# Patient Record
Sex: Male | Born: 1940
Health system: Southern US, Community
[De-identification: ages and names within clinical notes are randomized; demographics above are authoritative.]

## PROBLEM LIST (undated history)

## (undated) DIAGNOSIS — I10 Essential (primary) hypertension: Secondary | ICD-10-CM

## (undated) DIAGNOSIS — Z8546 Personal history of malignant neoplasm of prostate: Secondary | ICD-10-CM

## (undated) DIAGNOSIS — T7840XA Allergy, unspecified, initial encounter: Secondary | ICD-10-CM

## (undated) DIAGNOSIS — E876 Hypokalemia: Secondary | ICD-10-CM

## (undated) DIAGNOSIS — C801 Malignant (primary) neoplasm, unspecified: Secondary | ICD-10-CM

## (undated) DIAGNOSIS — H269 Unspecified cataract: Secondary | ICD-10-CM

## (undated) DIAGNOSIS — G4733 Obstructive sleep apnea (adult) (pediatric): Secondary | ICD-10-CM

## (undated) DIAGNOSIS — G4730 Sleep apnea, unspecified: Secondary | ICD-10-CM

## (undated) DIAGNOSIS — I639 Cerebral infarction, unspecified: Secondary | ICD-10-CM

## (undated) DIAGNOSIS — N059 Unspecified nephritic syndrome with unspecified morphologic changes: Secondary | ICD-10-CM

## (undated) DIAGNOSIS — I2510 Atherosclerotic heart disease of native coronary artery without angina pectoris: Secondary | ICD-10-CM

## (undated) DIAGNOSIS — E785 Hyperlipidemia, unspecified: Secondary | ICD-10-CM

## (undated) HISTORY — DX: Hypokalemia: E87.6

## (undated) HISTORY — DX: Sleep apnea, unspecified: G47.30

## (undated) HISTORY — DX: Obstructive sleep apnea (adult) (pediatric): G47.33

## (undated) HISTORY — PX: CAROTID STENT: SHX1301

## (undated) HISTORY — PX: CORONARY ANGIOPLASTY: SHX604

## (undated) HISTORY — PX: EYE SURGERY: SHX253

## (undated) HISTORY — PX: PROSTATE SURGERY: SHX751

## (undated) HISTORY — DX: Unspecified cataract: H26.9

## (undated) HISTORY — PX: COLONOSCOPY W/ POLYPECTOMY: SHX1380

## (undated) HISTORY — DX: Allergy, unspecified, initial encounter: T78.40XA

## (undated) HISTORY — DX: Personal history of malignant neoplasm of prostate: Z85.46

## (undated) HISTORY — DX: Essential (primary) hypertension: I10

## (undated) HISTORY — DX: Hyperlipidemia, unspecified: E78.5

## (undated) HISTORY — PX: HERNIA REPAIR: SHX51

## (undated) HISTORY — PX: CARDIAC CATHETERIZATION: SHX172

---

## 2000-03-19 ENCOUNTER — Emergency Department (HOSPITAL_COMMUNITY): Admission: EM | Admit: 2000-03-19 | Discharge: 2000-03-19 | Payer: Self-pay | Admitting: Emergency Medicine

## 2000-10-23 ENCOUNTER — Emergency Department (HOSPITAL_COMMUNITY): Admission: EM | Admit: 2000-10-23 | Discharge: 2000-10-23 | Payer: Self-pay | Admitting: Emergency Medicine

## 2000-10-23 ENCOUNTER — Encounter: Payer: Self-pay | Admitting: Emergency Medicine

## 2000-10-29 ENCOUNTER — Encounter: Admission: RE | Admit: 2000-10-29 | Discharge: 2000-10-29 | Payer: Self-pay | Admitting: Internal Medicine

## 2000-10-29 ENCOUNTER — Encounter: Payer: Self-pay | Admitting: Internal Medicine

## 2002-07-03 ENCOUNTER — Encounter: Payer: Self-pay | Admitting: General Surgery

## 2002-07-06 ENCOUNTER — Ambulatory Visit (HOSPITAL_COMMUNITY): Admission: RE | Admit: 2002-07-06 | Discharge: 2002-07-06 | Payer: Self-pay | Admitting: General Surgery

## 2002-09-24 ENCOUNTER — Encounter (INDEPENDENT_AMBULATORY_CARE_PROVIDER_SITE_OTHER): Payer: Self-pay | Admitting: *Deleted

## 2002-09-24 ENCOUNTER — Ambulatory Visit (HOSPITAL_COMMUNITY): Admission: RE | Admit: 2002-09-24 | Discharge: 2002-09-24 | Payer: Self-pay | Admitting: Gastroenterology

## 2004-03-28 ENCOUNTER — Emergency Department (HOSPITAL_COMMUNITY): Admission: EM | Admit: 2004-03-28 | Discharge: 2004-03-29 | Payer: Self-pay | Admitting: Emergency Medicine

## 2004-12-28 ENCOUNTER — Ambulatory Visit: Payer: Self-pay | Admitting: Internal Medicine

## 2005-01-01 ENCOUNTER — Ambulatory Visit (HOSPITAL_BASED_OUTPATIENT_CLINIC_OR_DEPARTMENT_OTHER): Admission: RE | Admit: 2005-01-01 | Discharge: 2005-01-01 | Payer: Self-pay | Admitting: Internal Medicine

## 2005-01-10 ENCOUNTER — Ambulatory Visit: Payer: Self-pay | Admitting: Internal Medicine

## 2005-01-25 ENCOUNTER — Ambulatory Visit: Payer: Self-pay | Admitting: Internal Medicine

## 2005-02-26 ENCOUNTER — Ambulatory Visit: Payer: Self-pay | Admitting: Internal Medicine

## 2005-06-25 ENCOUNTER — Ambulatory Visit: Payer: Self-pay | Admitting: Internal Medicine

## 2006-05-12 ENCOUNTER — Encounter: Admission: RE | Admit: 2006-05-12 | Discharge: 2006-05-12 | Payer: Self-pay | Admitting: Family Medicine

## 2006-05-17 ENCOUNTER — Ambulatory Visit: Payer: Self-pay | Admitting: Internal Medicine

## 2006-05-18 ENCOUNTER — Ambulatory Visit: Payer: Self-pay | Admitting: Cardiovascular Disease

## 2006-12-31 ENCOUNTER — Ambulatory Visit: Payer: Self-pay | Admitting: Infectious Disease

## 2006-12-31 ENCOUNTER — Inpatient Hospital Stay (HOSPITAL_COMMUNITY): Admission: EM | Admit: 2006-12-31 | Discharge: 2007-01-04 | Payer: Self-pay | Admitting: Emergency Medicine

## 2007-01-03 ENCOUNTER — Encounter: Payer: Self-pay | Admitting: Infectious Disease

## 2007-01-05 ENCOUNTER — Ambulatory Visit (HOSPITAL_COMMUNITY): Admission: RE | Admit: 2007-01-05 | Discharge: 2007-01-07 | Payer: Self-pay | Admitting: Cardiovascular Disease

## 2007-01-06 ENCOUNTER — Encounter (INDEPENDENT_AMBULATORY_CARE_PROVIDER_SITE_OTHER): Payer: Self-pay | Admitting: Cardiovascular Disease

## 2007-01-24 ENCOUNTER — Encounter (HOSPITAL_COMMUNITY): Admission: RE | Admit: 2007-01-24 | Discharge: 2007-03-01 | Payer: Self-pay | Admitting: Cardiovascular Disease

## 2007-02-03 ENCOUNTER — Telehealth (INDEPENDENT_AMBULATORY_CARE_PROVIDER_SITE_OTHER): Payer: Self-pay | Admitting: *Deleted

## 2007-03-02 ENCOUNTER — Encounter (HOSPITAL_COMMUNITY): Admission: RE | Admit: 2007-03-02 | Discharge: 2007-04-29 | Payer: Self-pay | Admitting: Cardiovascular Disease

## 2007-05-05 DIAGNOSIS — G4733 Obstructive sleep apnea (adult) (pediatric): Secondary | ICD-10-CM | POA: Insufficient documentation

## 2007-05-05 DIAGNOSIS — I10 Essential (primary) hypertension: Secondary | ICD-10-CM | POA: Insufficient documentation

## 2007-05-05 DIAGNOSIS — Z8546 Personal history of malignant neoplasm of prostate: Secondary | ICD-10-CM | POA: Insufficient documentation

## 2007-05-05 DIAGNOSIS — J309 Allergic rhinitis, unspecified: Secondary | ICD-10-CM | POA: Insufficient documentation

## 2007-05-11 ENCOUNTER — Encounter: Payer: Self-pay | Admitting: Internal Medicine

## 2007-05-26 ENCOUNTER — Encounter (INDEPENDENT_AMBULATORY_CARE_PROVIDER_SITE_OTHER): Payer: Self-pay | Admitting: *Deleted

## 2007-06-23 ENCOUNTER — Emergency Department (HOSPITAL_COMMUNITY): Admission: EM | Admit: 2007-06-23 | Discharge: 2007-06-23 | Payer: Self-pay | Admitting: Emergency Medicine

## 2009-05-28 ENCOUNTER — Encounter: Admission: RE | Admit: 2009-05-28 | Discharge: 2009-05-28 | Payer: Self-pay | Admitting: Cardiovascular Disease

## 2009-06-03 ENCOUNTER — Observation Stay (HOSPITAL_COMMUNITY): Admission: RE | Admit: 2009-06-03 | Discharge: 2009-06-04 | Payer: Self-pay | Admitting: Cardiovascular Disease

## 2009-07-03 ENCOUNTER — Encounter (HOSPITAL_COMMUNITY): Admission: RE | Admit: 2009-07-03 | Discharge: 2009-10-01 | Payer: Self-pay | Admitting: Cardiovascular Disease

## 2009-10-02 ENCOUNTER — Encounter (HOSPITAL_COMMUNITY): Admission: RE | Admit: 2009-10-02 | Discharge: 2009-10-30 | Payer: Self-pay | Admitting: Cardiovascular Disease

## 2010-05-20 LAB — BASIC METABOLIC PANEL
Calcium: 8.3 mg/dL — ABNORMAL LOW (ref 8.4–10.5)
Creatinine, Ser: 1.01 mg/dL (ref 0.4–1.5)
GFR calc non Af Amer: >60 mL/min (ref >60)

## 2010-05-20 LAB — CBC
HCT: 37.8 % — ABNORMAL LOW (ref 39.0–52.0)
MCHC: 34.2 g/dL (ref 30.0–36.0)
RBC: 4.19 MIL/uL — ABNORMAL LOW (ref 4.22–5.81)
RDW: 13.3 % (ref 11.5–15.5)
WBC: 13.2 10*3/uL — ABNORMAL HIGH (ref 4.0–10.5)

## 2010-07-14 NOTE — Cardiovascular Report (Signed)
NAME:  Eugene Murray, Eugene Murray NO.:  [id number]   MEDICAL RECORD NO.:  [id number]          PATIENT TYPE:  OIB   LOCATION:  6533                         FACILITY:  [facility]   PHYSICIAN:  Nicki Guadalajara, M.D.     DATE OF BIRTH:  08-05-1940   DATE OF PROCEDURE:  DATE OF DISCHARGE:                            CARDIAC CATHETERIZATION   INDICATIONS:  [name] is a 70 year old African American  gentleman who was admitted to [facility] over the weekend with  chest pain syndrome, worrisome for possible accelerated angina.  Cardiac  catheterization was done on January 02, 2007, which revealed normal LV  function.  He had a 95% focal proximal diagonal stenosis and a moderate-  sized diagonal vessel with mild mid LAD narrowing of 10%.  He also had  an 80% focal stenosis and a large right coronary artery just beyond the  acute margin.  At the time of the diagnostic catheterization, he  underwent successful PTCA/stenting of the diagonal vessel with ultimate  insertion of 2.5 x 13-mm drug-eluting Cypher stent postdilated to 2.67.  He was hydrated aggressively following the procedure.  He is now brought  back for elective intervention to a very large right coronary artery  system.   PROCEDURE:  The patient had been on Plavix since his initial diagnostic  study.  He had taken his 75 mg of Plavix this morning, and in the lab  received an additional 75 mg.  Right femoral artery was punctured  anteriorly and a 6-French sheath was inserted.  A re-look of the left  system was done with a 6-French FL-4 diagnostic catheter with the  demonstration of a widely patent stent in the diagonal system.  Attention was then directed at the right coronary artery.  A 6-French FR-  4 guide was used for the interventional procedure.  He was felt that the  vessel could be primary stented.  An ATW wire was advanced to help gauge  stent sizing.  Bivalirudin had been administered and ACT was  documented  to be therapeutic prior to the wire insertion.  A 3.5 x 18-mm drug-  eluting Cypher stent was then inserted and advanced to the RCA in the  region of the acute margin.  This was dilated x2 up to 17 atmospheres.  A 4.0 x 15-mm Quantum balloon was then used for post stent dilatation  with dilatation up to 4.05 mm.  Scout angiography confirmed an excellent  angiographic result.  There was no evidence for dissection.  There was  brisk TIMI III flow.  He tolerated the procedure well.  During the  procedure he did receive several doses of intracoronary nitroglycerin.  The arterial sheath was sutured in place with plans for sheath removal  later today.   HEMODYNAMIC DATA:  Central aortic pressure was 159/95, mean 123.   ANGIOGRAPHIC DATA:  A re-look of the left coronary system revealed a  widely patent stent in the diagonal vessel and no change in the previous  LAD intermediate circumflex anatomy.   The right coronary artery was a large dominant vessel that gave  rise to  a large PDA, inferior LV and moderate size posterolateral branch.  There  was 80-85% stenosis in the RCA distally in the region of the crux and  beyond.  Following successful stenting with a 3.5 x 18-mm Cypher stent  with post stent dilatation up to 4.05 mm, the 85% stenosis was reduced  to 0%.  There was brisk TIMI III flow and no evidence for dissection.   IMPRESSION:  Successful primary stenting of a large dominant right  coronary artery in the region of the crux with the 85% stenosis being  reduced to 0% utilizing a 3.5 x 18-mm drug-eluting Cypher stent  postdilated to 4.05 mm done with Bivalirudin/Plavix anticoagulation  treatment.  Next, widely patent previously placed stent from January 02, 2007, in the diagonal vessel with residual narrowing of 0%.           ______________________________  Nicki Guadalajara, M.D.     TK/MEDQ  D:  01/05/2007  T:  01/05/2007  Job:  [id number]   cc:   Clyda Greener, MD   Acey Lav, MD

## 2010-07-14 NOTE — Op Note (Signed)
NAME:  ARMAAN, Eugene Murray                ACCOUNT NO.:  [id number]   MEDICAL RECORD NO.:  [id number]          PATIENT TYPE:  OIB   LOCATION:  6533                         FACILITY:  [facility]   PHYSICIAN:  Cristy Hilts. Jacinto Halim, MD       DATE OF BIRTH:  1940/10/23   DATE OF PROCEDURE:  01/06/2007  DATE OF DISCHARGE:                               OPERATIVE REPORT   PROCEDURE PERFORMED:  Injection up of thrombin into the right femoral  pseudoaneurysm sac under ultrasound guidance.   INDICATIONS:  Mr. Benino Korinek is a 66-year gentleman with known  coronary artery disease.  He underwent successful PTCA and stenting to  his right coronary artery by Nicki Guadalajara on January 05, 2007.  Postprocedure, he developed a small hematoma followed by a bruit, and  evaluation of the hematoma by ultrasound revealed a moderate-sized  pseudoaneurysm sac with a long neck and measured about 2 cm in diameter.  Given this, he was referred to me for possible evaluation of  pseudoaneurysm sac injection of thrombin.   After obtaining informed consent from both the patient and his wife who  was present at the bedside and explaining the risks, benefits,  alternatives including thromboembolic risk of less than 1%, we proceeded  under sterile precautions to proceed with ultrasound-guided thrombin  injection.   Under direct ultrasound guidance using local anesthesia and 2 mg of  Versed and 0.5 mg of Dilaudid, an 18-gauge lumbar puncture needle was  introduced into the pseudoaneurysm sac.  Position of the needle was  confirmed by injecting saline contrast, and then 0.5 mL of 1:5000 units  of thrombin was injected directly into the pseudoaneurysm sac with  complete obliteration and thrombosis.  Preprocedure and postprocedure  posterior tibial pulse were documented.  The patient tolerated the  procedure well.  No immediate complications.      Cristy Hilts. Jacinto Halim, MD  Electronically Signed     JRG/MEDQ  D:  01/06/2007  T:  01/07/2007   Job:  [id number]   cc:   Nicki Guadalajara, M.D.

## 2010-07-14 NOTE — Cardiovascular Report (Signed)
NAME:  MAN, EFFERTZ NO.:  [id number]   MEDICAL RECORD NO.:  [id number]          PATIENT TYPE:  INP   LOCATION:  3707                         FACILITY:  [facility]   PHYSICIAN:  Nicki Guadalajara, M.D.     DATE OF BIRTH:  24-Aug-1940   DATE OF PROCEDURE:  01/02/2007  DATE OF DISCHARGE:                            CARDIAC CATHETERIZATION   CARDIAC CATHETERIZATION AND PERCUTANEOUS INTERVENTION NOTE:   INDICATIONS:  Mr. Maor Meckel is a very pleasant 70 year old African  American gentleman who has a longstanding, greater than 25 year, history  of hypertension, as well as a history of mild hyperlipidemia.  Reportedly, a treadmill study done several years ago in [place] was  reportedly normal.  That was done for chest pain.  The patient has  experienced recurrent episodes of chest pain, and over the past 2 months  these have increased.  At times, some of the chest pain does occur at  rest; at other times it is clearly exacerbated by activity.  He was  admitted to the teaching service, and cardiology consultation was done  by me yesterday, January 01, 2007.  Additional problems also include  sleep apnea on CPAP, GERD, diaphragmatic hernia, remote history of  prostate CA.  Due to my concern for possible accelerated angina  pectoris, definitive cardiac catheterization was recommended.   PROCEDURE:  After premedication with Valium 5 mg intravenously, the  patient prepped and draped in the usual fashion.  His right femoral  artery was punctured anteriorly and a 5-French sheath was inserted.  Diagnostic catheterization was done utilizing 5-French Judkins 4 left  and right coronary catheters.  A 5-French pigtail catheter was used for  biplane selective arteriography.  With his hypertensive history, distal  aortography was also performed.  At this point, I broke scrub.  I review  the angiograms with the patient.  I also went out to the waiting area  and discussed the angiographic  findings in detail with the patient's  wife.  Presently, he had already received approximately 175 cc of  contrast for the above studies.  After discussing with her options, a  decision was made to perform intervention to the 95+ percent stenosis in  the diagonal vessel today, and due to the patient's age and anticipated  contrast load, planned staged intervention to the RCA.   The patient agreed to this strategy.  The arterial sheath was then  upgraded to a 6-French system.  Bivalirudin was used for  anticoagulation.  While in the lab, the patient was also given 600 mg of  Plavix.  ACT was documented to be therapeutic.  Intervention was done  utilizing a 6-French JL-4 guiding catheter.  A Prowater wire was  advanced down the diagonal vessel.  Predilatation was done with a 2.5 x  12-mm Maverick balloon.  The patient underwent stenting with insertion  of a 2.5 x 13-mm drug-eluting Cypher stent.  There was careful attention  to bring the stent towards the ostium but not to have any of the stent  in the LAD proper.  Poststent dilatation was done utilizing a 2.75  x 10  mm DuraStar.  Poststent dilatation was noted to 2.67 mm.  Scout  angiography confirmed an excellent angiographic result.  The patient  tolerated the procedure well and was  returned to his room in  satisfactory condition.   HEMODYNAMIC DATA:  Central aortic pressure is 137/73.  Left ventricle  pressure is 137/24.   ANGIOGRAPHIC DATA:  Left main coronary was shortened trifurcating into  the LAD.  A small bifurcating ramus intermediate vessel in the large  circumflex system.   The LAD gave rise to a proximal trifurcating septal perforating artery  and gave rise to a long first diagonal vessel which bifurcated.  There  was at least 95+ percent stenosis in the proximal portion of this  diagonal vessel.  The LAD in its midsegment was 20%, and the remainder  of the LAD was free of significant disease.   The intermediate vessel  was small caliber bifurcating and  angiographically normal.   The circumflex vessel was angiographically normal.  It gave rise to  major marginal vessel.   The right coronary artery was a large-caliber vessel that had 80%  stenosis just beyond the crux prior to the PDA takeoff.   Biplane cineangiography revealed normal contractility without focal  segmental wall motion abnormalities.   Distal aortography revealed an essentially normal aortoiliac system  without renal artery stenosis.   The patient received several doses of intracoronary nitroglycerin down  the left coronary system.  Following predilatation with a 2.5 x 12 mm  Maverick, stenting of the diagonal with a 2.5 x 13 mm drug-eluting  Cypher stent, with ultimate post dilatations at 2.67 mm, the 95%  stenosis was reduced to 0%.  There was TIMI III flow.  There is no  evidence for dissection.   IMPRESSION:  1. Normal left ventricular function.  2. Ninety-five percent focal proximal diagonal stenosis in a moderate-      sized diagonal vessel with mild 10% mid-LAD narrowing.  3. Eighty percent focal stenosis in the right coronary artery just      beyond the acute margin.  4. Successful PTCA/stenting of the diagonal vessel with ultimate      insertion of a 2.5 x 13 mm drug-eluting Cypher stent postdilated to      2.67 mm done with bivalirudin/Plavix/nitroglycerin with the 95%      stenosis being reduced to 0%.   INDICATIONS:  1028 mL thank you           ______________________________  Nicki Guadalajara, M.D.     TK/MEDQ  D:  01/02/2007  T:  01/03/2007  Job:  [id number]   cc:   Acey Lav, MD  Hilario Quarry, M.D.

## 2010-07-14 NOTE — Discharge Summary (Signed)
NAME:  Eugene Murray, Eugene Murray NO.:  [id number]   MEDICAL RECORD NO.:  [id number]          PATIENT TYPE:  OIB   LOCATION:  6533                         FACILITY:  [facility]   PHYSICIAN:  Nicki Guadalajara, M.D.     DATE OF BIRTH:  05-16-1940   DATE OF ADMISSION:  01/05/2007  DATE OF DISCHARGE:  01/07/2007                               DISCHARGE SUMMARY   DISCHARGE DIAGNOSES:  1. Coronary artery disease with residual right coronary artery      stenosis.  Percutaneous transluminal coronary angioplasty and stent      deployment with a drug-eluting Cypher stent by Dr. Tresa Endo.  2. Pseudoaneurysm in right groin catheter site.  Thrombin injection by      Dr. Yates Decamp on January 06, 2007.  3. Hyperlipidemia.  4. Hypertension.  5. Obstructive sleep apnea on CPAP.  6. History of prostate cancer.  7. Diaphragmatic hernia.  8. History of gastroesophageal reflux disease.  9. History of pulmonary nodule.  10.CT scan results per primary care.  11.Recent admission with unstable angina, undergoing percutaneous      transluminal coronary angioplasty and Cypher stent to the diagonal      successfully by Dr. Tresa Endo.   CONDITION ON DISCHARGE:  Stable.   DISCHARGE MEDICATIONS:  1. Aspirin 325 mg daily.  2. Plavix 75 mg daily.  3. Lopressor 50 mg twice a day.  4. Lisinopril 20 mg daily.  5. Protonix 40 mg daily.  6. Lipitor 20 mg at bedtime.  7. Nitroglycerin sublingual.  8. Continued Plavix at least for one year.  Do not stop it because of      heart attack.  9. May stop Imdur.   DISCHARGE INSTRUCTIONS:  1. Increase activity slowly.  No lifting for two days.  No driving for      two days.  May shower or bathe.  2. Low sodium, heart healthy diet.  3. Wash catheter site with soap and water.  Call if any bleeding,      swelling or drainage.  4. Follow up with Dr. Tresa Endo.  The office will call with a date and      time in approximately two weeks to see him.  5. Follow up with primary  physician for pulmonary nodule.   HISTORY OF PRESENT ILLNESS:  A 70 year old male is admitted to [facility] originally December 31, 2006 secondary to chest pain.  He  was admitted and underwent heart catheterization and found to have two-  vessel disease.  Underwent Cypher stenting to the diagonal during the  previous hospitalization.  Was allowed to go home and came back and  underwent PTCA and Cypher stent to the RCA on this admission.  Other  history as discussed in discharge diagnosis.   Labs were stable with hemoglobin 12.1, hematocrit 38.6, WBC 13,  platelets 171,000.  Chemistry:  Sodium 140, potassium 3.3, chloride 104,  CO2 29, BUN 10, creatinine 1.02, glucose 95. INR 1.  LFTs normal.  There  were no followup heart labs.  Calcium 8.  TSH was done on previous  admission.  See those records.   Blood pressure on day of discharge was 140/82, pulse 68, respiratory  rate 18, temperature 98.8, oxygen saturation on room air 97%.  Heart and lungs without change, regular rate and rhythm.  Lungs were  clear.  Right groin better with thrombin injection of pseudoaneurysm.  He ambulated with cardiac rehab prior to discharge and did well.  He  will follow up with Dr. Nicki Guadalajara.   HOSPITAL COURSE:  The patient was admitted, underwent PTCA and stent as  stated.  Did develop some pseudoaneurysm of the right groin.  He had  injection by Dr. Jacinto Halim and by morning of January 07, 2007, was stable  and ready for discharge home.      Darcella Gasman. Annie Paras, N.P.    ______________________________  Nicki Guadalajara, M.D.    LRI/MEDQ  D:  01/07/2007  T:  01/07/2007  Job:  [id number]   cc:   Acey Lav, MD  Nicki Guadalajara, M.D.

## 2010-07-14 NOTE — Discharge Summary (Signed)
NAME:  Eugene Murray, Eugene Murray NO.:  [id number]   MEDICAL RECORD NO.:  [id number]          PATIENT TYPE:  INP   LOCATION:  6524                         FACILITY:  [facility]   PHYSICIAN:  Acey Lav, MD  DATE OF BIRTH:  Dec 24, 1940   DATE OF ADMISSION:  12/31/2006  DATE OF DISCHARGE:  01/04/2004                               DISCHARGE SUMMARY   DICTATED FOR:  This was dictated for both Acey Lav, MD and  Nicki Guadalajara, M.D., from a cardiology perspective.   DISCHARGE DIAGNOSES:  1. Unstable angina.  2. Negative myocardial infarction.  3. Hyperlipidemia.  4. Hypertension,  controlled.  5. Obstructive sleep apnea with CPAP.  6. History of prostate cancer.  7. Diaphragmatic hernia.  8. History of gastroesophageal reflux disease.  9. History of pulmonary nodule.  CT done during this admission results      pending.  10.Coronary artery disease with intervention to the diagonal with a      Cypher stent January 02, 2007, and residual 80% right coronary      artery stenosis that will need intervention on January 05, 2007.   HISTORY OF PRESENT ILLNESS:  The patient was admitted to [facility]  by Dr. Daiva Eves, teaching service,  secondary to chest pain on the  morning of admission, December 31, 2006.  Two separate episodes, the  first occurring after walking one mile with chest pain described as  substernal and pressure feeling.  It did not radiate, but there was  associated numbness of the left shoulder and arm and some jaw  discomfort.  This pain lasted less than 5 minutes, relieved with aspirin  and rest.  The second episode occurred several hours later while the  patient was at the car wash.  The symptoms were identical.  Neither  episode was associated with nausea, diaphoresis, vomiting, or dizziness.  The patient has had similar episodes previously for 2 years, usually  after eating a late meal and reclining in his [place].  Because this  was somewhat  different in timing, the patient came to the emergency  room.  He was admitted for cardiac evaluation.  Consult was obtained  with Dr. Tresa Endo.  During the hospitalization, the patient did have some  beats of ventricular tachycardia.  Potassium at one point was 3.1, and  this was replaced.   Dr.  Tresa Endo saw the patient and felt he needed cardiac catheterization.   PAST MEDICAL HISTORY:  Positive for:  1. Hypertension.  2. History of prostate cancer.  3. History of polyps.  4. History of reflux disease.  5. Dyslipidemia.  6. Diaphragmatic hernia.  7. Sleep apnea with CPAP.   OUTPATIENT MEDICATIONS:  1. Lisinopril 20.  2. Hydrochlorothiazide 25.   ALLERGIES:  PENICILLIN.   PHYSICAL EXAMINATION AT DISCHARGE PER DR. GANJI:  VITAL SIGNS:  Blood  pressure 131/79, pulse 70, respiratory rate 20, temperature 97.4, oxygen  saturation 96% on room air.  HEART:  Regular rate and rhythm.  LUNGS:  Clear.  EXTREMITIES:  Groin stable,  no hematoma.   LABORATORY DATA:  Hemoglobin  on admission 13.6, hematocrit 40.3, WBC  6.4, platelets 172. Hemoglobin 12.9 at discharge, hematocrit 38.3, and  that was on November 4.  Chemistries: Sodium 139, potassium 4.1 at  discharge.  It was 3.1 on admission.  This was replaced, and he has been  stable.  Chloride 97, CO2 26, BUN 13, creatinine 1.05, glucose 131.  Coags:  PT 14.2 on November 3, INR 1.1, PTT 185.  LFTs were normal.  CK-  MB ranged 146 with MB of 1.5 to 129 and 1.8, respectively, and troponin  I peaked at 0.06, started at 0.01.  These were essentially all negative.  Cholesterol 154, LDL 115, HDL 26, triglycerides 74.  Calcium 8.2 at  discharge.  TSH 0.583.   RADIOLOGY:  Chest x-ray:  No acute disease.   Please note, the patient's CT is pending at discharge.   A 2-D echocardiogram showed normal LV and RV size, good systolic  function, mildly dilated LA, RA, trace MR, TR, PR.  No prior study  available for comparison.   Heart catheterization  was done by Dr. Tresa Endo January 02, 2007,  with  awareness of 95% diagonal stenosis.  Underwent Cypher drug-eluting stent  reducing stenosis to zero.  He has residual 80% stenosis in the RCA and  needs to undergo procedure.   Patient tolerated hospitalization.  He will come back January 05, 2007,  at 6 o'clock in the morning for his PCI to the RCA.  Dr. Tresa Endo described  the procedure to the patient the morning he was discharged, and the  patient was agreeable to proceed.      Darcella Gasman. Annie Paras, N.P.      Acey Lav, MD  Electronically Signed    LRI/MEDQ  D:  01/04/2007  T:  01/04/2007  Job:  [id number]

## 2010-07-14 NOTE — Discharge Summary (Signed)
NAME:  Eugene Murray, STAN NO.:  [id number]   MEDICAL RECORD NO.:  [id number]          PATIENT TYPE:  INP   LOCATION:  6524                         FACILITY:  [facility]   PHYSICIAN:  Olene Craven, M.D.  DATE OF BIRTH:  08/06/1940   DATE OF ADMISSION:  12/31/2006  DATE OF DISCHARGE:  01/04/2007                               DISCHARGE SUMMARY   ATTENDING:  Acey Lav, MD   DISCHARGE DIAGNOSES:  1. Coronary artery disease.  2. Angina secondary to problem #1.  3. Hypertension.  4. History of prostate cancer, status post prostatectomy.   See cardiology discharge summary for full details about the patient's  hospital course.   LABORATORY DATA:  On admission CK-MB less than 1.0, troponin less than  0.05.  CBC white cells 6.4 hemoglobin 13.6, platelets 172, ANC 3.4.  Sodium 139, potassium 3.1, chloride 103, CO2 30, BUN 9, creatinine 0.89,  glucose 116, calcium 8.6, albumin 3.4, TSH 0.583.   PHYSICAL EXAMINATION:  VITAL SIGNS:  On admission temperature 96.9,  blood pressure 160/100.  Heart rate 71, respirations 20, 99% O2  saturation on room air.   Discharge labs, CK 121, CK-MB 1.8, troponin 0.06.  Sodium 139, potassium  4.1, chloride 107, CO2 26, BUN 13, creatinine 1.05, glucose 131, calcium  8.2.  CBC WBCs 10.0, hemoglobin 12.9, platelets 169.   The patient was discharged on January 04, 2007, with plans to undergo  repeat PCI with stent placement to the right coronary artery on the  morning of January 05, 2007, by Dr. Tresa Endo of cardiology.  We appreciate  their assistance.  Thank you.      Olene Craven, M.D.  Electronically Signed     MC/MEDQ  D:  01/05/2007  T:  01/06/2007  Job:  [id number]   cc:   Tresa Endo, MD

## 2010-07-17 NOTE — Assessment & Plan Note (Signed)
[facility]                             PULMONARY OFFICE NOTE   MYCHEAL, VELDHUIZEN                       MRN:          [id number]  DATE:05/17/2006                            DOB:          May 16, 1940    PULMONARY OFFICE FOLLOWUP   PROBLEMS:  1. Obstructive sleep apnea with hypersomnia.  2. Eventration of the diaphragm.   HISTORY:  He returns for followup of CPAP, which we had reduced to 10  CWP.  He feels that is comfortable.  He does take an occasional nap, but  is not having any problem with alertness while driving, and he sleeps  comfortably at night.  Dr. Bruna Potter had seen him in Early March with some  fever and question of pneumonia.  Chest x-ray noted progressive  eventration of the right hemidiaphragm with overlying crowding and  atelectasis.  Also some chronic interstitial changes without acute  process compared with January of 2006.  He has been taking Avelox.  He  denies neck pain, but does have occasional low back pain and he is aware  that he has some degenerative arthritis in the lumbosacral spine,  implying that he may have arthritis also in the neck.  I discussed  cervical spine disease with arthritic impingement on the roots of the  phrenic nerve for 1 basis for weakness in the diaphragm.   MEDICATIONS:  1. Lisinopril hydrochlorothiazide 20/25.  2. CPAP 10 CWP.  3. He is finishing Avelox.   DRUG INTOLERANCES:  PENICILLIN.   OBJECTIVE:  Weight 197 pounds, BP 124/76, pulse regular 71, room air  saturation 91%.  Breath sounds are diminished at the right base, but I hear no rales,  rhonchi, or wheeze.  He is not laboring.  General build is within normal.  I find no adenopathy or edema.  Heart sounds are regular.  No pressure marks on his face from the CPAP  mask.   IMPRESSION:  1. Obstructive sleep apnea is well-controlled now on continuous      positive airway pressure at 10 cm water pressure.  2. Eventration of the right  diaphragm.  3. Resolving flu syndrome.   PLAN:  CT scan of the chest without contrast.  Schedule return in 1  year, earlier p.r.n.     Clinton D. Maple Hudson, MD, Tonny Bollman, FACP  Electronically Signed    CDY/MedQ  DD: 05/21/2006  DT: 05/21/2006  Job #: [id number]   cc:   Dr. Carney Bern. [place]

## 2010-07-17 NOTE — Procedures (Signed)
NAME:  Eugene Murray, DREES NO.:  [id number]   MEDICAL RECORD NO.:  [id number]          PATIENT TYPE:  OUT   LOCATION:  SLEEP CENTER                 FACILITY:  [facility]   PHYSICIAN:  Clinton D. Maple Hudson, M.D. DATE OF BIRTH:  1940-10-27   DATE OF STUDY:  01/01/2005                              NOCTURNAL POLYSOMNOGRAM   REFERRING PHYSICIAN:  Clinton D. Maple Hudson, M.D.   INDICATION FOR STUDY:  Hypersomnia with sleep apnea.   EPWORTH SLEEPINESS SCORE:  12/24.  BMI 27, weight 198 pounds.   MEDICATIONS:  Lisinopril/HCTZ.   SLEEP ARCHITECTURE:  Total sleep time 421 minutes with sleep efficiency 89%,  stage 1 was 8%, stage 2 63%, stages 3 and 4 5%.  REM 23% of total sleep  time.  Sleep latency 3 minutes.  REM latency 57 minutes.  Awake after sleep  onset 52 minutes.  Arousal index 24.1.  No bedtime medication taken.  Sleep  architecture did not appear unusual for sleep center experience.   RESPIRATORY DATA:  Split study protocol.  Apnea/hypopnea index (AHI, RDI)  42.9 obstructive events per hour indicating moderately severe obstructive  sleep apnea/hypopnea syndrome before C-PAP.  There were 85 obstructive  apneas and 22 hypopneas before C-PAP.  Most events and most sleep were  recorded while supine on left side.  REM AHI 15.8.  C-PAP was titrated to 11  CWP, AHI 0 per hour.  A small Respironics comfort gel nasal mask was used  with heated humidifier.   OXYGEN DATA:  Moderate snoring with oxygen desaturation to a nadir of 84%  before C-PAP.  After C-PAP control, saturation held 95% to 98% on room air.   CARDIAC DATA:  Normal sinus rhythm.   MOVEMENT-PARASOMNIA:  The technician noted some short intervals of myoclonus  during REM and occasional leg jerk but overall impression was insignificant  abnormality.  Bathroom x1.   IMPRESSIONS-RECOMMENDATIONS:  1.  Moderately severe obstructive sleep apnea/hypopnea syndrome, AHI 42.9      per hour with moderate snoring and oxygen  desaturation to 84%.  2.  Successful C-PAP titration to 11 CWP, AHI 0 per hour.  A small      Respironics comfort gel nasal mask was used with heated humidifier.      Clinton D. Maple Hudson, M.D.  Diplomate, Biomedical engineer of Sleep Medicine  Electronically Signed     CDY/MEDQ  D:  01/10/2005 08:40:36  T:  01/11/2005 09:07:43  Job:  [id number]

## 2010-09-01 ENCOUNTER — Encounter: Payer: Self-pay | Admitting: Internal Medicine

## 2010-09-03 ENCOUNTER — Ambulatory Visit: Payer: Self-pay | Admitting: Internal Medicine

## 2010-09-09 ENCOUNTER — Ambulatory Visit (INDEPENDENT_AMBULATORY_CARE_PROVIDER_SITE_OTHER): Payer: Medicare Other | Admitting: Internal Medicine

## 2010-09-09 ENCOUNTER — Encounter: Payer: Self-pay | Admitting: Internal Medicine

## 2010-09-09 VITALS — BP 112/68 | HR 59 | Ht 71.0 in | Wt 204.8 lb

## 2010-09-09 DIAGNOSIS — Z8546 Personal history of malignant neoplasm of prostate: Secondary | ICD-10-CM

## 2010-09-09 DIAGNOSIS — G4733 Obstructive sleep apnea (adult) (pediatric): Secondary | ICD-10-CM

## 2010-09-09 NOTE — Assessment & Plan Note (Addendum)
Probably fairly good CPAP control at 10 with good compliance. Daytime sleepiness likely reflects poor sleep hygiene, staying up as late as 2 watching TV. Educated on good sleep habits. We will autotitrate for pressure check. Consider MSLT if he remains sleepy.

## 2010-09-09 NOTE — Patient Instructions (Signed)
Order- PCC-  Autotitrate CPAP [facility]    8-15 cwp for pressure check x 7 days  I may ask [facility] to change your pressure setting after I see the download. Please let me know if you aren't comfortable with the change.

## 2010-09-09 NOTE — Progress Notes (Signed)
Subjective:    Patient ID: Eugene Murray, male    DOB: Aug 04, 1940, 70 y.o.   MRN: [id number]  HPI 09/09/10- 73 yoM former smoker, coming to re-establish for OSA. Last here 05/17/06- note reviewed. NPSG 01/01/05- AHI 42.9/hr. Since then he has continued using CPAP 10 all night, every night ([facility]). Current machine works ok . He has a new full face mask. He feels he sleeps well, with little waking at night, no snore through. He and his wife are concerned that he still seems to fall asleep easily in the day time, especially if he sits. Stops to nap at rest stops omn long drives. Seldom caffeine. He dozes off and on and stays up late to channel surf, sometimes 2AM, then up at 6 AM.  Hx CAD w/ stents- Dr Tresa Endo.   Review of Systems Constitutional:   No-   weight loss, night sweats, fevers, chills, fatigue, lassitude. HEENT:   No-   headaches, difficulty swallowing, tooth/dental problems, sore throat,                  No-   sneezing, itching, ear ache, nasal congestion, post nasal drip,   CV:  No-   chest pain, orthopnea, PND, swelling in lower extremities, anasarca, dizziness, palpitations  GI:  No-   heartburn, indigestion, abdominal pain, nausea, vomiting, diarrhea,        No-    change in bowel habits, loss of appetite  Resp: No-   shortness of breath with exertion or at rest.  No-  excess mucus,             No-   productive cough,  No non-productive cough,  No-  coughing up of blood.              No-   change in color of mucus.  No- wheezing.    Skin: No-   rash or lesions.  GU: No-   dysuria, change in color of urine, no urgency or frequency.  No- flank pain.  MS:  No-   joint pain or swelling.  No- decreased range of motion.  No- back pain.  Psych:  No- change in mood or affect. No depression or anxiety.  No memory loss.      Objective:   Physical Exam General- Alert, Oriented, Affect-appropriate, Distress- none acute       Medium build, laconic affect Skin- rash-none, lesions- none,  excoriation- none Lymphadenopathy- none Head- atraumatic            Eyes- Gross vision intact, PERRLA, conjunctivae clear secretions            Ears- Hearing, canals            Nose- Clear, Septal dev, mucus, polyps, erosion, perforation             Throat- Mallampati III , mucosa clear , drainage- none, tonsils- atrophic Neck- flexible , trachea midline, no stridor , thyroid nl, carotid no bruit Chest - symmetrical excursion , unlabored           Heart/CV- RRR , no murmur , no gallop  , no rub, nl s1 s2                           - JVD- none , edema- none, stasis changes- none, varices- none           Lung- clear to P&A, wheeze- none, cough- none , dullness-none, rub-  none           Chest wall-  Abd- tender-no, distended-no, bowel sounds-present, HSM- no Br/ Gen/ Rectal- Not done, not indicated Extrem- cyanosis- none, clubbing, none, atrophy- none, strength- nl Neuro- grossly intact to observation         Assessment & Plan:

## 2010-09-18 ENCOUNTER — Encounter: Payer: Self-pay | Admitting: Internal Medicine

## 2010-11-24 LAB — CBC
HCT: 41.1
Hemoglobin: 13.9
MCV: 88.1
WBC: 6.2

## 2010-11-24 LAB — DIFFERENTIAL
Eosinophils Absolute: 0.1
Eosinophils Relative: 1
Lymphocytes Relative: 32
Lymphs Abs: 2
Monocytes Absolute: 0.5
Monocytes Relative: 7

## 2010-12-08 LAB — CBC
HCT: 35.6 — ABNORMAL LOW
HCT: 36.7 — ABNORMAL LOW
HCT: 38.3 — ABNORMAL LOW
Hemoglobin: 12.4 — ABNORMAL LOW
Hemoglobin: 12.9 — ABNORMAL LOW
MCHC: 33.3
MCHC: 33.7
MCHC: 33.9
MCV: 87.7
MCV: 87.7
Platelets: 170
Platelets: 171
Platelets: 172
Platelets: 176
RBC: 4.13 — ABNORMAL LOW
RBC: 4.19 — ABNORMAL LOW
RBC: 4.3
RBC: 4.46
RDW: 13.3
RDW: 13.3
RDW: 13.5
RDW: 13.6
WBC: 9.3

## 2010-12-08 LAB — POCT CARDIAC MARKERS
CKMB, poc: <1 — ABNORMAL LOW
Myoglobin, poc: 92.3
Operator id: 265201
Troponin i, poc: <0.05

## 2010-12-08 LAB — CK TOTAL AND CKMB (NOT AT ARMC)
CK, MB: 1.5
Relative Index: 1
Total CK: 146

## 2010-12-08 LAB — I-STAT 8, (EC8 V) (CONVERTED LAB)
Acid-Base Excess: 4 — ABNORMAL HIGH
Chloride: 105
Hemoglobin: 13.9
Potassium: 3.5
Sodium: 140
TCO2: 29

## 2010-12-08 LAB — CARDIAC PANEL(CRET KIN+CKTOT+MB+TROPI)
CK, MB: 1.5
CK, MB: 2
Relative Index: 1.3
Relative Index: 1.5
Total CK: 140
Total CK: 159

## 2010-12-08 LAB — BASIC METABOLIC PANEL
BUN: 10
BUN: 13
CO2: 26
CO2: 26
CO2: 29
CO2: 31
Calcium: 8.2 — ABNORMAL LOW
Calcium: 8.4
Calcium: 8.5
Calcium: 8.6
Chloride: 104
Chloride: 106
Creatinine, Ser: 1.02
Creatinine, Ser: 1.04
GFR calc Af Amer: >60
GFR calc Af Amer: >60
GFR calc Af Amer: >60
GFR calc non Af Amer: >60
Glucose, Bld: 131 — ABNORMAL HIGH
Glucose, Bld: 94
Glucose, Bld: 95
Potassium: 3.3 — ABNORMAL LOW
Potassium: 4.1
Sodium: 138
Sodium: 141

## 2010-12-08 LAB — MAGNESIUM: Magnesium: 2.1

## 2010-12-08 LAB — DIFFERENTIAL
Basophils Absolute: 0
Lymphocytes Relative: 39
Monocytes Absolute: 0.4
Monocytes Relative: 6
Neutro Abs: 3.4

## 2010-12-08 LAB — COMPREHENSIVE METABOLIC PANEL
Albumin: 3.4 — ABNORMAL LOW
BUN: 9
Calcium: 8.6
Creatinine, Ser: 0.89
Total Bilirubin: 0.9
Total Protein: 6.5

## 2010-12-08 LAB — LIPID PANEL
Cholesterol: 154
HDL: 26 — ABNORMAL LOW

## 2010-12-08 LAB — HEPARIN LEVEL (UNFRACTIONATED)
Heparin Unfractionated: 0.1 — ABNORMAL LOW
Heparin Unfractionated: 0.45
Heparin Unfractionated: 0.8 — ABNORMAL HIGH

## 2010-12-08 LAB — POCT I-STAT CREATININE
Creatinine, Ser: 1.1
Operator id: 265201

## 2010-12-08 LAB — PROTIME-INR
INR: 1
Prothrombin Time: 14.2

## 2010-12-08 LAB — APTT: aPTT: 185 — ABNORMAL HIGH

## 2011-09-09 ENCOUNTER — Ambulatory Visit (INDEPENDENT_AMBULATORY_CARE_PROVIDER_SITE_OTHER): Payer: Medicare Other | Admitting: Internal Medicine

## 2011-09-09 ENCOUNTER — Encounter: Payer: Self-pay | Admitting: Internal Medicine

## 2011-09-09 VITALS — BP 128/88 | HR 66 | Ht 70.75 in | Wt 205.2 lb

## 2011-09-09 DIAGNOSIS — G4733 Obstructive sleep apnea (adult) (pediatric): Secondary | ICD-10-CM

## 2011-09-09 NOTE — Progress Notes (Signed)
Subjective:    Patient ID: Eugene Murray, male    DOB: 07-Oct-1940, 71 y.o.   MRN: [id number]  HPI 09/09/10- 19 yoM former smoker, coming to re-establish for OSA. Last here 05/17/06- note reviewed. NPSG 01/01/05- AHI 42.9/hr. Since then he has continued using CPAP 10 all night, every night ([facility]). Current machine works ok . He has a new full face mask. He feels he sleeps well, with little waking at night, no snore through. He and his wife are concerned that he still seems to fall asleep easily in the day time, especially if he sits. Stops to nap at rest stops on long drives. Seldom caffeine. He dozes off and on and stays up late to channel surf, sometimes 2AM, then up at 6 AM.  Hx CAD w/ stents- Dr Tresa Endo.   09/09/11- 52 yoM former smoker followed for OSA, complicated by hx HBP, Allergic rhinitis Wears CPAP 10 every night for approximately 6 hours and still gets sleepy during the day-can fall asleep if sitting down. Sleepy only if sitting quietly, and he doesn't mind. Denies snoring through his mask. Admits he often will stay up until 2:00 watching TV and recognizes that his part why he is sleepy. We discussed sleep hygiene and CPAP.  ROS-see HPI Constitutional:   No-   weight loss, night sweats, fevers, chills, fatigue, lassitude. HEENT:   No-  headaches, difficulty swallowing, tooth/dental problems, sore throat,       No-  sneezing, itching, ear ache, nasal congestion, post nasal drip,  CV:  No-   chest pain, orthopnea, PND, swelling in lower extremities, anasarca, dizziness, palpitations Resp: No-   shortness of breath with exertion or at rest.              No-   productive cough,  No non-productive cough,  No- coughing up of blood.              No-   change in color of mucus.  No- wheezing.   Skin: No-   rash or lesions. GI:  No-   heartburn, indigestion, abdominal pain, nausea, vomiting,  GU:  MS:  No-   joint pain or swelling.   Neuro-     nothing unusual Psych:  No- change in mood or  affect. No depression or anxiety.  No memory loss.  Objective:   Physical Exam General- Alert, Oriented, Affect-appropriate, Distress- none acute,  Medium build, laconic affect Skin- rash-none, lesions- none, excoriation- none Lymphadenopathy- none Head- atraumatic            Eyes- Gross vision intact, PERRLA, conjunctivae clear secretions            Ears- Hearing, canals            Nose- Clear, Septal dev, mucus, polyps, erosion, perforation             Throat- Mallampati III , mucosa clear , drainage- none, tonsils- atrophic Neck- flexible , trachea midline, no stridor , thyroid nl, carotid no bruit Chest - symmetrical excursion , unlabored           Heart/CV- RRR , no murmur , no gallop  , no rub, nl s1 s2                           - JVD- none , edema- none, stasis changes- none, varices- none           Lung- clear to P&A, wheeze- none,  cough- none , dullness-none, rub- none           Chest wall-  Abd-  Br/ Gen/ Rectal- Not done, not indicated Extrem- cyanosis- none, clubbing, none, atrophy- none, strength- nl Neuro- grossly intact to observation  Assessment & Plan:

## 2011-09-09 NOTE — Patient Instructions (Addendum)
Order- DME- Eugene Murray did a pressure recommendation download after his ov here 09/09/10- Do they have that report that they can still send?  Order- DME [facility]- increase CPAP to 12 cwp  Please let us know it you don't like the pressure change.

## 2011-09-19 NOTE — Assessment & Plan Note (Addendum)
He emphasized with me that he was satisfied leaving pressure where it is. He is retired and doesn't recognize daytime sleepiness is getting in his way. After discussion, he agreed to try a change. Plan-we can increase pressure to match suggested download of 14.

## 2012-06-30 ENCOUNTER — Encounter (HOSPITAL_COMMUNITY): Payer: Self-pay | Admitting: Emergency Medicine

## 2012-06-30 ENCOUNTER — Emergency Department (HOSPITAL_COMMUNITY): Payer: Medicare Other

## 2012-06-30 ENCOUNTER — Emergency Department (INDEPENDENT_AMBULATORY_CARE_PROVIDER_SITE_OTHER)
Admission: EM | Admit: 2012-06-30 | Discharge: 2012-06-30 | Disposition: A | Discharge status: Nursing Home (Includes ICF) | Payer: Medicare Other | Source: Home / Self Care

## 2012-06-30 ENCOUNTER — Observation Stay (HOSPITAL_COMMUNITY)
Admission: EM | Admit: 2012-06-30 | Discharge: 2012-07-01 | Disposition: A | Discharge status: Home / Self Care | Payer: Medicare Other | Attending: Internal Medicine | Admitting: Internal Medicine

## 2012-06-30 DIAGNOSIS — I498 Other specified cardiac arrhythmias: Secondary | ICD-10-CM | POA: Insufficient documentation

## 2012-06-30 DIAGNOSIS — I639 Cerebral infarction, unspecified: Secondary | ICD-10-CM

## 2012-06-30 DIAGNOSIS — E876 Hypokalemia: Secondary | ICD-10-CM | POA: Diagnosis present

## 2012-06-30 DIAGNOSIS — I10 Essential (primary) hypertension: Secondary | ICD-10-CM | POA: Diagnosis present

## 2012-06-30 DIAGNOSIS — J309 Allergic rhinitis, unspecified: Secondary | ICD-10-CM | POA: Diagnosis present

## 2012-06-30 DIAGNOSIS — M6281 Muscle weakness (generalized): Secondary | ICD-10-CM

## 2012-06-30 DIAGNOSIS — I6330 Cerebral infarction due to thrombosis of unspecified cerebral artery: Secondary | ICD-10-CM

## 2012-06-30 DIAGNOSIS — Z8546 Personal history of malignant neoplasm of prostate: Secondary | ICD-10-CM

## 2012-06-30 DIAGNOSIS — E785 Hyperlipidemia, unspecified: Secondary | ICD-10-CM | POA: Diagnosis present

## 2012-06-30 DIAGNOSIS — Z9861 Coronary angioplasty status: Secondary | ICD-10-CM | POA: Insufficient documentation

## 2012-06-30 DIAGNOSIS — R001 Bradycardia, unspecified: Secondary | ICD-10-CM | POA: Diagnosis present

## 2012-06-30 DIAGNOSIS — R269 Unspecified abnormalities of gait and mobility: Secondary | ICD-10-CM | POA: Insufficient documentation

## 2012-06-30 DIAGNOSIS — I2510 Atherosclerotic heart disease of native coronary artery without angina pectoris: Secondary | ICD-10-CM | POA: Diagnosis present

## 2012-06-30 DIAGNOSIS — G459 Transient cerebral ischemic attack, unspecified: Secondary | ICD-10-CM

## 2012-06-30 DIAGNOSIS — Z79899 Other long term (current) drug therapy: Secondary | ICD-10-CM | POA: Insufficient documentation

## 2012-06-30 DIAGNOSIS — I6350 Cerebral infarction due to unspecified occlusion or stenosis of unspecified cerebral artery: Principal | ICD-10-CM | POA: Insufficient documentation

## 2012-06-30 DIAGNOSIS — R29898 Other symptoms and signs involving the musculoskeletal system: Secondary | ICD-10-CM | POA: Insufficient documentation

## 2012-06-30 DIAGNOSIS — G4733 Obstructive sleep apnea (adult) (pediatric): Secondary | ICD-10-CM | POA: Diagnosis present

## 2012-06-30 DIAGNOSIS — R531 Weakness: Secondary | ICD-10-CM

## 2012-06-30 HISTORY — DX: Atherosclerotic heart disease of native coronary artery without angina pectoris: I25.10

## 2012-06-30 LAB — CBC
HCT: 38.6 % — ABNORMAL LOW (ref 39.0–52.0)
MCH: 29.8 pg (ref 26.0–34.0)
MCV: 82.8 fL (ref 78.0–100.0)
Platelets: 141 10*3/uL — ABNORMAL LOW (ref 150–400)
RDW: 13.5 % (ref 11.5–15.5)
WBC: 5.2 10*3/uL (ref 4.0–10.5)

## 2012-06-30 LAB — DIFFERENTIAL
Basophils Absolute: 0 10*3/uL (ref 0.0–0.1)
Eosinophils Absolute: 0.1 10*3/uL (ref 0.0–0.7)
Eosinophils Relative: 1 % (ref 0–5)
Lymphocytes Relative: 40 % (ref 12–46)
Lymphs Abs: 2.1 10*3/uL (ref 0.7–4.0)
Monocytes Absolute: 0.4 10*3/uL (ref 0.1–1.0)

## 2012-06-30 LAB — GLUCOSE, CAPILLARY: Glucose-Capillary: 91 mg/dL (ref 70–99)

## 2012-06-30 LAB — COMPREHENSIVE METABOLIC PANEL
CO2: 29 mEq/L (ref 19–32)
Calcium: 8.9 mg/dL (ref 8.4–10.5)
Creatinine, Ser: 0.86 mg/dL (ref 0.50–1.35)
GFR calc Af Amer: >90 mL/min (ref >90)
GFR calc non Af Amer: 85 mL/min — ABNORMAL LOW (ref >90)
Glucose, Bld: 88 mg/dL (ref 70–99)
Sodium: 140 mEq/L (ref 135–145)
Total Protein: 7.1 g/dL (ref 6.0–8.3)

## 2012-06-30 LAB — POCT I-STAT TROPONIN I: Troponin i, poc: 0 ng/mL (ref 0.00–0.08)

## 2012-06-30 LAB — PROTIME-INR: Prothrombin Time: 14.1 seconds (ref 11.6–15.2)

## 2012-06-30 MED ORDER — CLOPIDOGREL BISULFATE 75 MG PO TABS
75.0000 mg | ORAL_TABLET | Freq: Every day | ORAL | Status: DC
  Filled 2012-06-30: qty 1

## 2012-06-30 MED ORDER — SODIUM CHLORIDE 0.9 % IJ SOLN: 3.0000 mL | INTRAMUSCULAR | Status: DC | PRN

## 2012-06-30 MED ORDER — ASPIRIN 81 MG PO TABS: 81.0000 mg | ORAL_TABLET | Freq: Every day | ORAL | Status: DC

## 2012-06-30 MED ORDER — NITROGLYCERIN 0.4 MG SL SUBL: 0.4000 mg | SUBLINGUAL_TABLET | SUBLINGUAL | Status: DC | PRN

## 2012-06-30 MED ORDER — POTASSIUM CHLORIDE CRYS ER 20 MEQ PO TBCR
40.0000 meq | EXTENDED_RELEASE_TABLET | Freq: Once | ORAL | Status: AC
  Administered 2012-06-30: 40 meq via ORAL
  Filled 2012-06-30: qty 2

## 2012-06-30 MED ORDER — ASPIRIN EC 81 MG PO TBEC
81.0000 mg | DELAYED_RELEASE_TABLET | Freq: Every day | ORAL | Status: DC
  Administered 2012-07-01: 81 mg via ORAL
  Filled 2012-06-30: qty 1

## 2012-06-30 MED ORDER — SODIUM CHLORIDE 0.9 % IJ SOLN
3.0000 mL | Freq: Two times a day (BID) | INTRAMUSCULAR | Status: DC
  Administered 2012-06-30 – 2012-07-01 (×2): 3 mL via INTRAVENOUS

## 2012-06-30 MED ORDER — SENNOSIDES-DOCUSATE SODIUM 8.6-50 MG PO TABS: 1.0000 | ORAL_TABLET | Freq: Every evening | ORAL | Status: DC | PRN

## 2012-06-30 MED ORDER — SODIUM CHLORIDE 0.9 % IJ SOLN: 3.0000 mL | Freq: Two times a day (BID) | INTRAMUSCULAR | Status: DC

## 2012-06-30 MED ORDER — ONDANSETRON HCL 4 MG PO TABS: 4.0000 mg | ORAL_TABLET | Freq: Four times a day (QID) | ORAL | Status: DC | PRN

## 2012-06-30 MED ORDER — ATORVASTATIN CALCIUM 80 MG PO TABS
80.0000 mg | ORAL_TABLET | Freq: Every day | ORAL | Status: DC
  Administered 2012-06-30: 80 mg via ORAL
  Filled 2012-06-30 (×2): qty 1

## 2012-06-30 MED ORDER — ONDANSETRON HCL 4 MG/2ML IJ SOLN: 4.0000 mg | Freq: Four times a day (QID) | INTRAMUSCULAR | Status: DC | PRN

## 2012-06-30 MED ORDER — SODIUM CHLORIDE 0.9 % IV SOLN: 250.0000 mL | INTRAVENOUS | Status: DC | PRN

## 2012-06-30 NOTE — ED Notes (Signed)
Pt sent to mri on stretcher. nadn.

## 2012-06-30 NOTE — Consult Note (Signed)
Referring Physician: Dr. Eben Burow    Chief Complaint: Transient weakness of right arm and leg  HPI: Eugene Murray is an 72 y.o. male with a history of hypertension, obstructive sleep apnea, coronary artery disease and prostate cancer, presenting with transient weakness involving right arm and right leg. Onset was at 7:30 AM today. She's had similar symptoms of weakness involving his right arm over the last week. He also noticed slow numbness in his speech but no frank dysarthria. This has occurred on multiple occasions as well. There is no previous history of stroke. Patient is on Plavix and aspirin but had not taken aspirin for about 5 days prior to onset of current symptoms. CT scan of his head showed no acute intracranial abnormality. MRI showed an equivocal tiny acute left pontomedullary junction stroke. MRI showed mild atherosclerotic changes but was otherwise unremarkable. NIH stroke score was 0.  LSN: 7:30 AM on 06/30/2012 tPA Given: No: Rapid resolution of symptoms MRankin: 0  Past Medical History  Diagnosis Date  . Allergic rhinitis   . History of prostate cancer   . Hypertension   . OSA (obstructive sleep apnea)   . Coronary artery disease     Family History  Problem Relation Age of Onset  . Asthma Sister   . Colon cancer Father      Medications:  Prior to Admission: Norvasc 5 mg per day Aspirin 81 mg per day Lipitor 40 mg per day Plavix 75 mg per day Hydrochlorothiazide 25 mg per day Lisinopril 40 mg per day Lopressor 50 mg twice a day Nitroglycerin 0.4 mg sublingual when necessary   Physical Examination: Blood pressure 156/93, pulse 69, temperature 98 F (36.7 C), temperature source Oral, resp. rate 16, SpO2 99.00%.  Neurologic Examination: Mental Status: Alert, oriented, thought content appropriate.  Speech fluent without evidence of aphasia. Able to follow commands without difficulty. Cranial Nerves: II-Visual fields were normal. III/IV/VI-Pupils were equal  and reacted. Extraocular movements were full and conjugate.    V/VII-no facial numbness and no facial weakness. VIII-normal. X-normal speech and symmetrical palatal movement. Motor: 5/5 bilaterally with normal tone and bulk Sensory: Normal throughout. Deep Tendon Reflexes: 1+ and symmetric. Plantars: Flexor bilaterally Cerebellar: Normal finger-to-nose testing. Carotid auscultation: Normal  Ct Head (brain) Wo Contrast  06/30/2012  *RADIOLOGY REPORT*  Clinical Data: Acute onset right upper and lower extremity weakness.  Right-sided foot drop.  CT HEAD WITHOUT CONTRAST  Technique:  Contiguous axial images were obtained from the base of the skull through the vertex without contrast.  Comparison: None.  Findings: No evidence of acute infarct, acute hemorrhage, mass lesion, mass effect or hydrocephalus.  Minimal periventricular low attenuation.  Small retention cysts or polyps are seen in the left maxillary sinus.  IMPRESSION:  1.  No acute findings. 2.  Minimal chronic microvascular white matter ischemic changes.   Original Report Authenticated By: Leanna Battles, M.D.    Mr Angiogram Head Wo Contrast  06/30/2012  *RADIOLOGY REPORT*  Clinical Data:  Right-sided weakness.  Hypertension.  History prostate cancer.  MRI BRAIN WITHOUT CONTRAST MRA HEAD WITHOUT CONTRAST  Technique: Multiplanar, multiecho pulse sequences of the brain and surrounding structures were obtained according to standard protocol without intravenous contrast.  Angiographic images of the head were obtained using MRA technique without contrast.  Comparison: 06/30/2012 head CT.  No comparison brain MR.  MRI HEAD  Findings:  Question tiny acute infarct left pontomedullary junction.  Moderate white matter type changes most consistent with result of small vessel disease.  No intracranial hemorrhage.  No hydrocephalus.  No intracranial mass lesion detected on this unenhanced exam.  Major intracranial vascular structures are patent.  Upper  cervical cord is of decreased caliber of questionable significance/etiology.  Cervical medullary junction, pituitary region and pineal region unremarkable.  Exophthalmos.  IMPRESSION: Question tiny acute infarct left pontomedullary junction.  Moderate white matter type changes most consistent with result of small vessel disease.  Please see above  MRA HEAD  Findings: Anterior circulation without medium or large size vessel significant stenosis or occlusion.  Middle cerebral artery mild branch vessel irregularity bilaterally.  Mild narrowing distal M1 segment left middle cerebral artery.  Ectatic vertebral arteries and basilar artery.  Nonvisualization left PICA and right AICA.  Mild branch vessel irregularity superior cerebellar artery and posterior cerebral artery bilaterally.  No aneurysm or vascular malformation noted.  IMPRESSION: Mild intracranial atherosclerotic type changes as detailed above the   Original Report Authenticated By: Lacy Duverney, M.D.    Mr Brain Wo Contrast  06/30/2012  *RADIOLOGY REPORT*  Clinical Data:  Right-sided weakness.  Hypertension.  History prostate cancer.  MRI BRAIN WITHOUT CONTRAST MRA HEAD WITHOUT CONTRAST  Technique: Multiplanar, multiecho pulse sequences of the brain and surrounding structures were obtained according to standard protocol without intravenous contrast.  Angiographic images of the head were obtained using MRA technique without contrast.  Comparison: 06/30/2012 head CT.  No comparison brain MR.  MRI HEAD  Findings:  Question tiny acute infarct left pontomedullary junction.  Moderate white matter type changes most consistent with result of small vessel disease.  No intracranial hemorrhage.  No hydrocephalus.  No intracranial mass lesion detected on this unenhanced exam.  Major intracranial vascular structures are patent.  Upper cervical cord is of decreased caliber of questionable significance/etiology.  Cervical medullary junction, pituitary region and pineal  region unremarkable.  Exophthalmos.  IMPRESSION: Question tiny acute infarct left pontomedullary junction.  Moderate white matter type changes most consistent with result of small vessel disease.  Please see above  MRA HEAD  Findings: Anterior circulation without medium or large size vessel significant stenosis or occlusion.  Middle cerebral artery mild branch vessel irregularity bilaterally.  Mild narrowing distal M1 segment left middle cerebral artery.  Ectatic vertebral arteries and basilar artery.  Nonvisualization left PICA and right AICA.  Mild branch vessel irregularity superior cerebellar artery and posterior cerebral artery bilaterally.  No aneurysm or vascular malformation noted.  IMPRESSION: Mild intracranial atherosclerotic type changes as detailed above the   Original Report Authenticated By: Lacy Duverney, M.D.     Assessment: 72 y.o. male presenting with equivocal small left pontine medullary junction ischemic stroke, versus TIA.  Stroke Risk Factors - family history, hyperlipidemia and hypertension  Plan: 1. HgbA1c, fasting lipid panel 2. Echocardiogram 3. Carotid dopplers 4. Prophylactic therapy-Antiplatelet med: Aspirin 81 mg per day Plavix 75 mg per day 5. Risk factor modification 6. Telemetry monitoring   C.R. Roseanne Reno, MD [facility] [phone]  06/30/2012, 5:34 PM

## 2012-06-30 NOTE — ED Provider Notes (Signed)
History     CSN: [id number]  Arrival date & time 06/30/12  1246   First MD Initiated Contact with Patient 06/30/12 1322      Chief Complaint  Patient presents with  . Weakness    (Consider location/radiation/quality/duration/timing/severity/associated sxs/prior treatment) HPI Comments: Eugene Murray is a 72 y.o. Male who is here for evaluation of right arm  and leg weakness, that lasted for 10-15 minutes earlier today. He feels like it got better when he took a baby aspirin. He has had several other episodes, including a period of dysarthria 2 days ago, and dizziness. 2 weeks ago. He denies headache. He has never had a stroke. He is using his usual medications, without relief. He has not had fever, chills, nausea, vomiting, chest pain, shortness of breath, cough, or abdominal pain. There are no known modifying factors  Patient is a 72 y.o. male presenting with weakness. The history is provided by the patient.  Weakness    Past Medical History  Diagnosis Date  . Allergic rhinitis   . History of prostate cancer   . Hypertension   . OSA (obstructive sleep apnea)   . Coronary artery disease     Past Surgical History  Procedure Laterality Date  . Hernia repair    . Prostate surgery      had cancer  . Carotid stent  11-08 and 4-11    Family History  Problem Relation Age of Onset  . Asthma Sister   . Colon cancer Father     History  Substance Use Topics  . Smoking status: Former Smoker -- 15 years    Types: Cigarettes    Quit date: 09/08/1985  . Smokeless tobacco: Not on file  . Alcohol Use: Yes      Review of Systems  Neurological: Positive for weakness.  All other systems reviewed and are negative.    Allergies  Other; Penicillins; and Sulfa antibiotics  Home Medications   Current Outpatient Rx  Name  Route  Sig  Dispense  Refill  . amLODipine (NORVASC) 5 MG tablet   Oral   Take 5 mg by mouth daily.           Marland Kitchen aspirin 81 MG tablet   Oral   Take  81 mg by mouth daily.           Marland Kitchen atorvastatin (LIPITOR) 40 MG tablet   Oral   Take 40 mg by mouth daily.           . clopidogrel (PLAVIX) 75 MG tablet   Oral   Take 75 mg by mouth daily.           . hydrochlorothiazide 25 MG tablet   Oral   Take 25 mg by mouth daily.           Marland Kitchen lisinopril (PRINIVIL,ZESTRIL) 40 MG tablet   Oral   Take 40 mg by mouth daily.           . metoprolol (LOPRESSOR) 50 MG tablet   Oral   Take 50 mg by mouth 2 (two) times daily.          . nitroGLYCERIN (NITROSTAT) 0.4 MG SL tablet   Sublingual   Place 0.4 mg under the tongue every 5 (five) minutes as needed.             BP 132/98  Pulse 55  Temp(Src) 98 F (36.7 C) (Oral)  Resp 17  SpO2 100%  Physical Exam  Nursing note  and vitals reviewed. Constitutional: He is oriented to person, place, and time. He appears well-developed and well-nourished.  HENT:  Head: Normocephalic and atraumatic.  Right Ear: External ear normal.  Left Ear: External ear normal.  Eyes: Conjunctivae and EOM are normal. Pupils are equal, round, and reactive to light.  Neck: Normal range of motion and phonation normal. Neck supple.  Cardiovascular: Normal rate, regular rhythm, normal heart sounds and intact distal pulses.   Pulmonary/Chest: Effort normal and breath sounds normal. He exhibits no bony tenderness.  Abdominal: Soft. Normal appearance. There is no tenderness.  Musculoskeletal: Normal range of motion.  Neurological: He is alert and oriented to person, place, and time. He has normal strength. No cranial nerve deficit or sensory deficit. He exhibits normal muscle tone. Coordination normal.  Mild dysmetria, right hand. Normal heel-to-shin bilaterally  Skin: Skin is warm, dry and intact.  Psychiatric: He has a normal mood and affect. His behavior is normal. Judgment and thought content normal.    ED Course  Procedures (including critical care time) Medications - No data to display Patient Vitals  for the past 24 hrs:  BP Temp Temp src Pulse Resp SpO2  06/30/12 1459 - 98 F (36.7 C) - - - -  06/30/12 1445 132/98 mmHg - - 55 17 100 %  06/30/12 1400 119/73 mmHg - - 50 - 100 %  06/30/12 1250 148/92 mmHg 97.5 F (36.4 C) Oral 56 18 100 %   Consultation with stroke neurologist- 15:20- he will see the patient as a Research scientist (medical) Consultation: Possiblefor admission 15:45-    Date: 06/30/12  Rate: 50  Rhythm: normal sinus rhythm  QRS Axis: normal  PR and QT Intervals: normal  ST/T Wave abnormalities: normal  PR and QRS Conduction Disutrbances:none  Narrative Interpretation:   Old EKG Reviewed: none available   Labs Reviewed  CBC - Abnormal; Notable for the following:    HCT 38.6 (*)    Platelets 141 (*)    All other components within normal limits  COMPREHENSIVE METABOLIC PANEL - Abnormal; Notable for the following:    Potassium 3.4 (*)    GFR calc non Af Amer 85 (*)    All other components within normal limits  PROTIME-INR  APTT  DIFFERENTIAL  TROPONIN I  GLUCOSE, CAPILLARY  POCT I-STAT TROPONIN I   Ct Head (brain) Wo Contrast  06/30/2012  *RADIOLOGY REPORT*  Clinical Data: Acute onset right upper and lower extremity weakness.  Right-sided foot drop.  CT HEAD WITHOUT CONTRAST  Technique:  Contiguous axial images were obtained from the base of the skull through the vertex without contrast.  Comparison: None.  Findings: No evidence of acute infarct, acute hemorrhage, mass lesion, mass effect or hydrocephalus.  Minimal periventricular low attenuation.  Small retention cysts or polyps are seen in the left maxillary sinus.  IMPRESSION:  1.  No acute findings. 2.  Minimal chronic microvascular white matter ischemic changes.   Original Report Authenticated By: Leanna Battles, M.D.      1. TIA (transient ischemic attack)       MDM  Transient neurologic symptoms consistent with TIA. Symptoms have been recurrent over the last several weeks. MRI has been ordered to evaluate for CVA.  Patient will need to be admitted for risk stratification and further treatment    Plan: Admit to hospitalist    Flint Melter, MD 06/30/12 2213

## 2012-06-30 NOTE — ED Notes (Signed)
Pt returned for mri.

## 2012-06-30 NOTE — H&P (Signed)
Hospital Admission Note Date: 06/30/2012  Patient name: Eugene Murray Medical record number: [id number] Date of birth: [birth year] Age: 72 y.o. Gender: male PCP: Burtis Junes, MD Cardiologist: Dr. Nicki Guadalajara  Medical Service: Internal Medicine Attending physician: Dr. Eben Burow   1st Contact: Dr. Shirlee Latch [id number] 2nd Contact: Dr. Dierdre Searles Pager:[phone] After 5 pm or weekends: 1st Contact: Pager: [phone] 2nd Contact: Pager: [phone]  Chief Complaint: Right arm and Leg weakness, slowed speech   History of Present Illness: 72 y.o. male who is here for evaluation of right arm and leg weakness which started at 7:30 this morning.   He went to [facility] this morning for evaluation and was sent to [facility] ED for further evaluation.  Symptoms included slowed speech, abnormal gait (right foot dragging), weakness (right arm and leg). Symptoms improved after taking Aspirin 81 mg x 1, Plavix 75 mg x 1, and resting. Overall symptoms improved since onset.  He also had resolved lightheadedness or dizziness today (though the last few days to weeks (06/27/12 and 2.5 weeks ago) he has been lightheaded and felt like he had to hold onto something to walk). He has never had a stroke.   Meds: Current Outpatient Rx  Name  Route  Sig  Dispense  Refill  . amLODipine (NORVASC) 5 MG tablet   Oral   Take 5 mg by mouth daily.           Marland Kitchen aspirin 81 MG tablet   Oral   Take 81 mg by mouth daily.           Marland Kitchen atorvastatin (LIPITOR) 40 MG tablet   Oral   Take 40 mg by mouth daily.           . clopidogrel (PLAVIX) 75 MG tablet   Oral   Take 75 mg by mouth daily.           . hydrochlorothiazide 25 MG tablet   Oral   Take 25 mg by mouth daily.           Marland Kitchen lisinopril (PRINIVIL,ZESTRIL) 40 MG tablet   Oral   Take 40 mg by mouth daily.           . metoprolol (LOPRESSOR) 50 MG tablet   Oral   Take 50 mg by mouth 2 (two) times daily.          . nitroGLYCERIN (NITROSTAT) 0.4 MG  SL tablet   Sublingual   Place 0.4 mg under the tongue every 5 (five) minutes as needed.             Allergies: Allergies as of 06/30/2012 - Review Complete 06/30/2012  Allergen Reaction Noted  . Other Hives 09/09/2010  . Penicillins Other (See Comments)   . Sulfa antibiotics Hives 09/09/2010   Past Medical History  Diagnosis Date  . Allergic rhinitis   . History of prostate cancer   . Hypertension   . OSA (obstructive sleep apnea)     cpap machine at home  . Coronary artery disease    Past Surgical History  Procedure Laterality Date  . Hernia repair      x 2   . Prostate surgery      had cancer; radical resection 1997   . Carotid stent  11-08 and 4-11    stent x 3 (12/2006 x 2; 2011 x 1 stent)   Family History  Problem Relation Age of Onset  . Asthma Sister   . Colon cancer Father  possibly age 63 y.o diagnosed   . Stroke Mother    History   Social History  . Marital Status: Married    Spouse Name: N/A    Number of Children: N/A  . Years of Education: N/A   Occupational History  . retired    Social History Main Topics  . Smoking status: Former Smoker -- 15 years    Types: Cigarettes    Quit date: 09/08/1985  . Smokeless tobacco: Not on file  . Alcohol Use: Yes  . Drug Use: No  . Sexually Active: Not on file   Other Topics Concern  . Not on file   Social History Narrative   3 kids    Retired Technical sales engineer    Former smoker quit 1980s. Denies etOH, other drugs     Review of Systems: General: denies fever/chills HEENT: denies dysphagia, denies vision changes Cardiac: denies chest pain Pulm: denies sob Abd/GU: denies abdominal pain, constipation, blood in urine or stool Ext: denies swelling arms or legs  Neuro: +slowed speech (improved), +abnormal gait (right foot dragging), +weakness (right arm and leg since 7:30 am 06/30/12 though symptoms improved after taking Aspirin 81 mg x 1, Plavix 75 mg x 1, and resting), chronic morning h/a's (mild h/a  this am resolved though), denies vertigo, resolved lightheadedness or dizziness (though the last few days to weeks (06/27/12 and 2.5 weeks ago) he has been lightheaded and felt like he had to hold onto something to walk), denies numbness or tingling   Physical Exam: 50, 100% room air, 17, 144/79 (96)  Blood pressure 137/64, pulse 52, temperature 98 F (36.7 C), temperature source Oral, resp. rate 11, SpO2 100.00%.  General: resting in bed, NAD HEENT: PERRL b/l, no scleral icterus, normal dentition Cardiac: SB, no rubs, murmurs or gallops Pulm: clear to auscultation bilaterally, no wheezes, rales, or rhonchi Abd: soft, nontender, nondistended, BS present Ext: warm and well perfused, no pedal edema Neuro: alert and oriented x 3, CN 2-12 grossly intact, normal sensation, 5/5 motor strength all 4 extremities    Lab results: Basic Metabolic Panel:  Recent Labs  16/10/96 1254  NA 140  K 3.4*  CL 103  CO2 29  GLUCOSE 88  BUN 15  CREATININE 0.86  CALCIUM 8.9   Liver Function Tests:  Recent Labs  06/30/12 1254  AST 19  ALT 14  ALKPHOS 69  BILITOT 0.5  PROT 7.1  ALBUMIN 3.7   CBC:  Recent Labs  06/30/12 1254  WBC 5.2  NEUTROABS 2.7  HGB 13.9  HCT 38.6*  MCV 82.8  PLT 141*   Cardiac Enzymes:  Recent Labs  06/30/12 1254  TROPONINI <0.30   CBG:  Recent Labs  06/30/12 1255  GLUCAP 91   Hemoglobin A1C: No results found for this basename: HGBA1C,  in the last 72 hours Fasting Lipid Panel: No results found for this basename: CHOL, HDL, LDLCALC, TRIG, CHOLHDL, LDLDIRECT,  in the last 72 hours  Coagulation:  Recent Labs  06/30/12 1254  LABPROT 14.1  INR 1.10  Misc. Labs: HA1C, lipid panel    Imaging results:  Ct Head (brain) Wo Contrast  06/30/2012  *RADIOLOGY REPORT*  Clinical Data: Acute onset right upper and lower extremity weakness.  Right-sided foot drop.  CT HEAD WITHOUT CONTRAST  Technique:  Contiguous axial images were obtained from the base  of the skull through the vertex without contrast.  Comparison: None.  Findings: No evidence of acute infarct, acute hemorrhage, mass lesion, mass effect or  hydrocephalus.  Minimal periventricular low attenuation.  Small retention cysts or polyps are seen in the left maxillary sinus.  IMPRESSION:  1.  No acute findings. 2.  Minimal chronic microvascular white matter ischemic changes.   Original Report Authenticated By: Leanna Battles, M.D.    Mr Angiogram Head Wo Contrast  06/30/2012  *RADIOLOGY REPORT*  Clinical Data:  Right-sided weakness.  Hypertension.  History prostate cancer.  MRI BRAIN WITHOUT CONTRAST MRA HEAD WITHOUT CONTRAST  Technique: Multiplanar, multiecho pulse sequences of the brain and surrounding structures were obtained according to standard protocol without intravenous contrast.  Angiographic images of the head were obtained using MRA technique without contrast.  Comparison: 06/30/2012 head CT.  No comparison brain MR.  MRI HEAD  Findings:  Question tiny acute infarct left pontomedullary junction.  Moderate white matter type changes most consistent with result of small vessel disease.  No intracranial hemorrhage.  No hydrocephalus.  No intracranial mass lesion detected on this unenhanced exam.  Major intracranial vascular structures are patent.  Upper cervical cord is of decreased caliber of questionable significance/etiology.  Cervical medullary junction, pituitary region and pineal region unremarkable.  Exophthalmos.  IMPRESSION: Question tiny acute infarct left pontomedullary junction.  Moderate white matter type changes most consistent with result of small vessel disease.  Please see above  MRA HEAD  Findings: Anterior circulation without medium or large size vessel significant stenosis or occlusion.  Middle cerebral artery mild branch vessel irregularity bilaterally.  Mild narrowing distal M1 segment left middle cerebral artery.  Ectatic vertebral arteries and basilar artery.   Nonvisualization left PICA and right AICA.  Mild branch vessel irregularity superior cerebellar artery and posterior cerebral artery bilaterally.  No aneurysm or vascular malformation noted.  IMPRESSION: Mild intracranial atherosclerotic type changes as detailed above the   Original Report Authenticated By: Lacy Duverney, M.D.    Mr Brain Wo Contrast  06/30/2012  *RADIOLOGY REPORT*  Clinical Data:  Right-sided weakness.  Hypertension.  History prostate cancer.  MRI BRAIN WITHOUT CONTRAST MRA HEAD WITHOUT CONTRAST  Technique: Multiplanar, multiecho pulse sequences of the brain and surrounding structures were obtained according to standard protocol without intravenous contrast.  Angiographic images of the head were obtained using MRA technique without contrast.  Comparison: 06/30/2012 head CT.  No comparison brain MR.  MRI HEAD  Findings:  Question tiny acute infarct left pontomedullary junction.  Moderate white matter type changes most consistent with result of small vessel disease.  No intracranial hemorrhage.  No hydrocephalus.  No intracranial mass lesion detected on this unenhanced exam.  Major intracranial vascular structures are patent.  Upper cervical cord is of decreased caliber of questionable significance/etiology.  Cervical medullary junction, pituitary region and pineal region unremarkable.  Exophthalmos.  IMPRESSION: Question tiny acute infarct left pontomedullary junction.  Moderate white matter type changes most consistent with result of small vessel disease.  Please see above  MRA HEAD  Findings: Anterior circulation without medium or large size vessel significant stenosis or occlusion.  Middle cerebral artery mild branch vessel irregularity bilaterally.  Mild narrowing distal M1 segment left middle cerebral artery.  Ectatic vertebral arteries and basilar artery.  Nonvisualization left PICA and right AICA.  Mild branch vessel irregularity superior cerebellar artery and posterior cerebral artery  bilaterally.  No aneurysm or vascular malformation noted.  IMPRESSION: Mild intracranial atherosclerotic type changes as detailed above the   Original Report Authenticated By: Lacy Duverney, M.D.     Other results: EKG: SB, HR 60, normal axis, intervals, T wave inversions  AVR, no ST changes, no LVH  Assessment & Plan by Problem: 72 y.o. male who presented with right arm and leg weakness which started at 7:30 this morning, slowed speech for workup for TIA/stroke.   1. Questionable tiny acute infarct left pontomedullary junction versus TIA -Risk factors include HTN, dyslipidemia, age, family history.  On admission symptoms improved significantly since onset.  Some symptoms have been recurrent over the last two weeks.  CT negative.  MRI 06/30/12 Question tiny acute infarct left pontomedullary junction. Moderate white matter type changes most consistent with result of small vessel disease. MRA Mild intracranial atherosclerotic type changes.  His ABCD2 score was is at least 5 with moderate risk of progression of TIA to stroke.   -pending echo, US carotid, HA1C, lipid panel  -Neuro checks  -PT/OT -Continue Plavix 75 mg qd and Aspirin 81 mg qd. Hold home antihypertensives (Lisinopril, Lopressor)  -Neuro Dr. Roseanne Reno saw the patient in the ED for consult   2. Sinus bradycardia  -HR in the 50s.  He is on Lopressor 50 mg bid.   -Appears asymptomatic  -If symptomatic consider decreasing dose Lopressor but make cardiologist aware  3. History of CAD s/p stents x 3  -Continue ASA 81 mg qd, Lipitor 40 mg qhs, Plavix 75 mg qd, prn NTG -hold Lisinopril 40 mg qd, Lopressor 50 mg bid due to #1 -Cardiologist is Dr. Nicki Guadalajara  4. History of HTN  -BP at [facility] was 168/93. BP at [facility] on admission was 90/42.  -Monitor VS, permissive HTN 24-48 hours  -Hold Lisinopril and Lopressor for now  5. History of dyslipidemia  Lipid Panel     Component Value Date/Time   CHOL  Value: 154        ATP III  CLASSIFICATION:  <200     mg/dL   Desirable  784-696  mg/dL   Borderline High  >=295    mg/dL   High 28/05/1322 4010   TRIG 74 01/01/2007 0350   HDL 26* 01/01/2007 0350   CHOLHDL 5.9 01/01/2007 0350   VLDL 15 01/01/2007 0350   LDLCALC  Value: 113        Total Cholesterol/HDL:CHD Risk Coronary Heart Disease Risk Table                     Men   Women  1/2 Average Risk   3.4   3.3* 01/01/2007 0350   Will repeat lipid panel  Continue Lipitor 40 mg qhs   6. History of OSA -cpap qhs   7. Prostate cancer history (1990s) -No acute issues, in remission   8. F/E/N -NSL -Hypokalemia (3.4)-Kdur 40 meQ x 1  -Regular diet   9. DVT px  -scds   Dispo: Disposition is deferred at this time, awaiting improvement of current medical problems. Anticipated discharge in approximately 2-3 day(s).   The patient does have a current PCP Burtis Junes, MD), therefore will be requiring [facility]PC follow-up after discharge.   The patient does not have transportation limitations that hinder transportation to clinic appointments.  SignedAnnett Gula [phone] 06/30/2012, 6:35 PM

## 2012-06-30 NOTE — ED Notes (Signed)
Report to [name], rn

## 2012-06-30 NOTE — ED Notes (Signed)
Dinner tray delivered to Pt.

## 2012-06-30 NOTE — ED Notes (Signed)
Pt sts weakness in right arm this am at 0730 that lasted approx 10 min with some slow speech; pt denies complaint at present; pt sent from [facility] for further eval

## 2012-06-30 NOTE — ED Notes (Signed)
Pt passed stroke swallow screen earlier in shift.  Pt requesting dinner.  Spoke with Dr. Dierdre Searles.  Regular diet order obtained.

## 2012-06-30 NOTE — ED Notes (Signed)
Patient transported to CT 

## 2012-06-30 NOTE — ED Notes (Signed)
Pt c/o right hand/arm and right leg weakness this am Lasted for  About 45 minutes; noticed speech was slower than usual and a mild headache Denies: CP, SOB Hx of CAD, HTN  Bilateral grip equal, NAD He is alert and oriented w/no signs of acute distress.

## 2012-06-30 NOTE — ED Notes (Signed)
Internal med at bedside to see pt.

## 2012-06-30 NOTE — ED Notes (Signed)
Chart review.

## 2012-06-30 NOTE — ED Provider Notes (Signed)
History     CSN: [id number]  Arrival date & time 06/30/12  1141   None     Chief Complaint  Patient presents with  . Weakness    (Consider location/radiation/quality/duration/timing/severity/associated sxs/prior treatment) HPI Comments: 72 year old male with history of hypertension, dyslipidemia and coronary artery disease among other comorbidities. Here complaining of episodes of mild dizziness and headache associated with right leg weakness to the point of dragging his right foot while walking and right upper extremity weakness with a week grip, Also reports felt his "speech was slow but not slurred" and he was able to understand well when spoken to. All this symptoms occurred this morning (around 7:30am) and lasted for about 10-15 minutes.  Patient states he took a "baby aspirin" and symptoms resolved but has experienced some intermittent weakness type of discomfort in his right leg and right upper extremity since. Patient reports he had a episode of dizziness and loss of balance 2 days ago. He also reports that has had intermittent low extremity weakness but never experienced any weakness, numbness or paresthesias in his upper extremities. Currently denies headache or visual changes. Able to walk without a limp and without loss of balance. No nausea or vomiting. Denies chest pain, shortness of breath or palpitations.   Past Medical History  Diagnosis Date  . Allergic rhinitis   . History of prostate cancer   . Hypertension   . OSA (obstructive sleep apnea)   . Coronary artery disease     Past Surgical History  Procedure Laterality Date  . Hernia repair    . Prostate surgery      had cancer  . Carotid stent  11-08 and 4-11    Family History  Problem Relation Age of Onset  . Asthma Sister   . Colon cancer Father     History  Substance Use Topics  . Smoking status: Former Smoker -- 15 years    Types: Cigarettes    Quit date: 09/08/1985  . Smokeless tobacco: Not on file   . Alcohol Use: Yes      Review of Systems  Constitutional: Negative for fever, chills, diaphoresis and fatigue.  HENT: Negative for hearing loss and tinnitus.   Eyes: Negative for visual disturbance.  Respiratory: Negative for shortness of breath and wheezing.   Cardiovascular: Negative for chest pain, palpitations and leg swelling.  Gastrointestinal: Negative for nausea, vomiting, abdominal pain and diarrhea.  Neurological: Positive for weakness. Negative for dizziness, tremors, seizures, syncope, facial asymmetry, numbness and headaches.  All other systems reviewed and are negative.    Allergies  Other; Penicillins; and Sulfa antibiotics  Home Medications   Current Outpatient Rx  Name  Route  Sig  Dispense  Refill  . amLODipine (NORVASC) 5 MG tablet   Oral   Take 5 mg by mouth daily.           Marland Kitchen aspirin 81 MG tablet   Oral   Take 81 mg by mouth daily.           Marland Kitchen atorvastatin (LIPITOR) 40 MG tablet   Oral   Take 40 mg by mouth daily.           . clopidogrel (PLAVIX) 75 MG tablet   Oral   Take 75 mg by mouth daily.           . hydrochlorothiazide 25 MG tablet   Oral   Take 25 mg by mouth daily.           Marland Kitchen  lisinopril (PRINIVIL,ZESTRIL) 40 MG tablet   Oral   Take 40 mg by mouth daily.           . metoprolol (LOPRESSOR) 50 MG tablet      Take 1.5 every morning and 1 every evening          . nitroGLYCERIN (NITROSTAT) 0.4 MG SL tablet   Sublingual   Place 0.4 mg under the tongue every 5 (five) minutes as needed.             BP 168/93  Pulse 53  Temp(Src) 97.7 F (36.5 C) (Oral)  Resp 18  SpO2 98%  Physical Exam  Nursing note and vitals reviewed. Constitutional: He is oriented to person, place, and time. He appears well-developed and well-nourished. No distress.  HENT:  Head: Normocephalic and atraumatic.  Eyes: Conjunctivae and EOM are normal. Pupils are equal, round, and reactive to light.  Cardiovascular: Regular rhythm, normal  heart sounds and intact distal pulses.   Pulmonary/Chest: Effort normal and breath sounds normal. No respiratory distress. He has no wheezes. He has no rales. He exhibits no tenderness.  Neurological: He is alert and oriented to person, place, and time. He has normal strength and normal reflexes. No cranial nerve deficit or sensory deficit. He displays a negative Romberg sign. Coordination and gait normal. GCS eye subscore is 4. GCS verbal subscore is 5. GCS motor subscore is 6.  Visual fields normal by comparison. No face drop. No arm drop. Normal rapid alternating finger to nose movements.  Skin: He is not diaphoretic.  Psychiatric: He has a normal mood and affect. His behavior is normal. Judgment and thought content normal.    ED Course  Procedures (including critical care time)  Labs Reviewed - No data to display No results found.   1. Weakness of right side of body       MDM  72 y/o male here c/o right upper and lower extremity weakness episode for about 10 minutes this morning. On exam: Initial vital signs with mild tachycardia and hypotension. Orthostatic vital signs BP 160's-170's-80's-90's without significant orthostatic changes. Neurologic exam is grossly normal with no focalization. Decided to transfer to the emergency department for further evaluation and management.      As  Sharin Grave, MD 06/30/12 1241

## 2012-07-01 DIAGNOSIS — G459 Transient cerebral ischemic attack, unspecified: Secondary | ICD-10-CM

## 2012-07-01 DIAGNOSIS — I6330 Cerebral infarction due to thrombosis of unspecified cerebral artery: Secondary | ICD-10-CM

## 2012-07-01 LAB — LIPID PANEL: Cholesterol: 113 mg/dL (ref 0–200)

## 2012-07-01 LAB — BASIC METABOLIC PANEL
CO2: 28 mEq/L (ref 19–32)
Calcium: 8.5 mg/dL (ref 8.4–10.5)
Creatinine, Ser: 1.01 mg/dL (ref 0.50–1.35)
GFR calc non Af Amer: 72 mL/min — ABNORMAL LOW (ref >90)
Glucose, Bld: 91 mg/dL (ref 70–99)

## 2012-07-01 LAB — CBC
MCH: 29.5 pg (ref 26.0–34.0)
MCV: 83.4 fL (ref 78.0–100.0)
Platelets: 135 10*3/uL — ABNORMAL LOW (ref 150–400)
RDW: 13.4 % (ref 11.5–15.5)

## 2012-07-01 LAB — HEMOGLOBIN A1C: Mean Plasma Glucose: 108 mg/dL (ref <117)

## 2012-07-01 MED ORDER — POTASSIUM CHLORIDE CRYS ER 20 MEQ PO TBCR
20.0000 meq | EXTENDED_RELEASE_TABLET | ORAL | Status: DC
  Administered 2012-07-01 (×2): 20 meq via ORAL
  Filled 2012-07-01 (×4): qty 1

## 2012-07-01 MED ORDER — ATORVASTATIN CALCIUM 40 MG PO TABS: 80.0000 mg | ORAL_TABLET | Freq: Every day | ORAL | Status: DC

## 2012-07-01 NOTE — Progress Notes (Signed)
Echocardiogram completed.

## 2012-07-01 NOTE — Discharge Summary (Signed)
Internal Medicine Teaching [facility] Discharge Note  Name: Eugene Murray MRN: [id number] DOB: 1940-09-13 72 y.o.  Date of Admission: 06/30/2012  1:06 PM Date of Discharge: 07/01/2012 Attending Physician: Lars Mage, MD/Dr. Criselda Peaches  Discharge Diagnosis: 1. Tiny acute infarct left pontomedullary junction 2. Sinus bradycardia (asymptomatic) 3. History of CAD status post stents x 3  4. History of HTN, controlled  5. History of dyslipidemia  6. History of OSA (obstructive sleep apnea) complaint with cpap 7. Hypokalemia  Discharge Medications:   Medication List    TAKE these medications       amLODipine 5 MG tablet  Commonly known as:  NORVASC  Take 5 mg by mouth daily.     aspirin 81 MG tablet  Take 81 mg by mouth daily.     atorvastatin 40 MG tablet  Commonly known as:  LIPITOR  Take 2 tablets (80 mg total) by mouth daily.     clopidogrel 75 MG tablet  Commonly known as:  PLAVIX  Take 75 mg by mouth daily.     hydrochlorothiazide 25 MG tablet  Commonly known as:  HYDRODIURIL  Take 25 mg by mouth daily.     lisinopril 40 MG tablet  Commonly known as:  PRINIVIL,ZESTRIL  Take 40 mg by mouth daily.     metoprolol 50 MG tablet  Commonly known as:  LOPRESSOR  Take 50 mg by mouth 2 (two) times daily.     nitroGLYCERIN 0.4 MG SL tablet  Commonly known as:  NITROSTAT  Place 0.4 mg under the tongue every 5 (five) minutes as needed.       Start taking blood pressure medications (Metoprolol, Lisinopril, HCTZ on 07/02/12)   Disposition and follow-up:   Mr.Eugene Murray was discharged from [facility] in stable condition.  At the hospital follow up visit please address  1) BMET-if still hypokalemic may need daily potassium 2) follow up hemoglobin A 1C, echo results, final results of US carotids   Follow-up Appointments: Follow-up Information   Follow up with Gates Rigg, MD In 1 month. (Call Monday for an appointment in 1-2 months)    Contact  information:   [address] [place] [address] [phone]      Discharge Orders   Future Appointments Provider Department Dept Phone   09/07/2012 9:15 AM Waymon Budge, MD [facility] [phone]   Future Orders Complete By Expires     Diet - low sodium heart healthy  As directed     Discharge instructions  As directed     Comments:      1) Please call Dr. Bruna Potter and schedule hospital follow up in 1-2 weeks from discharge 2) Follow up with Dr. Tresa Endo as scheduled 3) Please follow up with [facility] Neurology.  If they do not call you with an appointment within 2 weeks to 1 month call them. [phone] 4) Increase Lipitor to 80 mg 5) Please resume blood pressure medications 07/02/12.  5) Take Plavix 75 mg daily and Aspirin 81 mg daily    Driving Restrictions  As directed     Comments:      None per Neurology Dr. Anne Hahn    Increase activity slowly  As directed        Consultations: Treatment Team:  Md Stroke, MD-Dr. Noel Christmas  Procedures Performed:  Ct Head (brain) Wo Contrast  06/30/2012  *RADIOLOGY REPORT*  Clinical Data: Acute onset right upper and lower extremity weakness.  Right-sided  foot drop.  CT HEAD WITHOUT CONTRAST  Technique:  Contiguous axial images were obtained from the base of the skull through the vertex without contrast.  Comparison: None.  Findings: No evidence of acute infarct, acute hemorrhage, mass lesion, mass effect or hydrocephalus.  Minimal periventricular low attenuation.  Small retention cysts or polyps are seen in the left maxillary sinus.  IMPRESSION:  1.  No acute findings. 2.  Minimal chronic microvascular white matter ischemic changes.   Original Report Authenticated By: Leanna Battles, M.D.    Mr Angiogram Head Wo Contrast  06/30/2012  *RADIOLOGY REPORT*  Clinical Data:  Right-sided weakness.  Hypertension.  History prostate cancer.  MRI BRAIN WITHOUT CONTRAST MRA HEAD WITHOUT CONTRAST  Technique: Multiplanar, multiecho  pulse sequences of the brain and surrounding structures were obtained according to standard protocol without intravenous contrast.  Angiographic images of the head were obtained using MRA technique without contrast.  Comparison: 06/30/2012 head CT.  No comparison brain MR.  MRI HEAD  Findings:  Question tiny acute infarct left pontomedullary junction.  Moderate white matter type changes most consistent with result of small vessel disease.  No intracranial hemorrhage.  No hydrocephalus.  No intracranial mass lesion detected on this unenhanced exam.  Major intracranial vascular structures are patent.  Upper cervical cord is of decreased caliber of questionable significance/etiology.  Cervical medullary junction, pituitary region and pineal region unremarkable.  Exophthalmos.  IMPRESSION: Question tiny acute infarct left pontomedullary junction.  Moderate white matter type changes most consistent with result of small vessel disease.  Please see above  MRA HEAD  Findings: Anterior circulation without medium or large size vessel significant stenosis or occlusion.  Middle cerebral artery mild branch vessel irregularity bilaterally.  Mild narrowing distal M1 segment left middle cerebral artery.  Ectatic vertebral arteries and basilar artery.  Nonvisualization left PICA and right AICA.  Mild branch vessel irregularity superior cerebellar artery and posterior cerebral artery bilaterally.  No aneurysm or vascular malformation noted.  IMPRESSION: Mild intracranial atherosclerotic type changes as detailed above the   Original Report Authenticated By: Lacy Duverney, M.D.    Mr Brain Wo Contrast  06/30/2012  *RADIOLOGY REPORT*  Clinical Data:  Right-sided weakness.  Hypertension.  History prostate cancer.  MRI BRAIN WITHOUT CONTRAST MRA HEAD WITHOUT CONTRAST  Technique: Multiplanar, multiecho pulse sequences of the brain and surrounding structures were obtained according to standard protocol without intravenous contrast.   Angiographic images of the head were obtained using MRA technique without contrast.  Comparison: 06/30/2012 head CT.  No comparison brain MR.  MRI HEAD  Findings:  Question tiny acute infarct left pontomedullary junction.  Moderate white matter type changes most consistent with result of small vessel disease.  No intracranial hemorrhage.  No hydrocephalus.  No intracranial mass lesion detected on this unenhanced exam.  Major intracranial vascular structures are patent.  Upper cervical cord is of decreased caliber of questionable significance/etiology.  Cervical medullary junction, pituitary region and pineal region unremarkable.  Exophthalmos.  IMPRESSION: Question tiny acute infarct left pontomedullary junction.  Moderate white matter type changes most consistent with result of small vessel disease.  Please see above  MRA HEAD  Findings: Anterior circulation without medium or large size vessel significant stenosis or occlusion.  Middle cerebral artery mild branch vessel irregularity bilaterally.  Mild narrowing distal M1 segment left middle cerebral artery.  Ectatic vertebral arteries and basilar artery.  Nonvisualization left PICA and right AICA.  Mild branch vessel irregularity superior cerebellar artery and posterior cerebral artery bilaterally.  No aneurysm or vascular malformation noted.  IMPRESSION: Mild intracranial atherosclerotic type changes as detailed above the   Original Report Authenticated By: Lacy Duverney, M.D.     2D Echo: read pending   Cardiac Cath: none   Admission HPI:  Chief Complaint: Right arm and Leg weakness, slowed speech   History of Present Illness:  72 y.o. male who is here for evaluation of right arm and leg weakness which started at 7:30 this morning. He went to [facility] this morning for evaluation and was sent to [facility] ED for further evaluation. Symptoms included slowed speech, abnormal gait (right foot dragging), weakness (right arm and leg). Symptoms  improved after taking Aspirin 81 mg x 1, Plavix 75 mg x 1, and resting. Overall symptoms improved since onset. He also had resolved lightheadedness or dizziness today (though the last few days to weeks (06/27/12 and 2.5 weeks ago) he has been lightheaded and felt like he had to hold onto something to walk). He has never had a stroke  Review of Systems:  General: denies fever/chills  HEENT: denies dysphagia, denies vision changes  Cardiac: denies chest pain  Pulm: denies sob  Abd/GU: denies abdominal pain, constipation, blood in urine or stool  Ext: denies swelling arms or legs  Neuro: +slowed speech (improved), +abnormal gait (right foot dragging), +weakness (right arm and leg since 7:30 am 06/30/12 though symptoms improved after taking Aspirin 81 mg x 1, Plavix 75 mg x 1, and resting), chronic morning h/a's (mild h/a this am resolved though), denies vertigo, resolved lightheadedness or dizziness (though the last few days to weeks (06/27/12 and 2.5 weeks ago) he has been lightheaded and felt like he had to hold onto something to walk), denies numbness or tingling   Hospital Course by problem list: 1. Tiny acute infarct left pontomedullary junction  2. Sinus bradycardia (asymptomatic) 3. History of CAD status post stents x 3  4. History of HTN, controlled  5. History of dyslipidemia  6. History of OSA (obstructive sleep apnea) compliant with cpap  7. Hypokalemia  72 y.o. male who presented with right arm and leg weakness. slowed speech for workup for TIA/stroke.   1. Tiny acute infarct left pontomedullary junction Risk factors include hypertension, dyslipidemia, age, family history. On admission symptoms improved significantly since onset. Some symptoms have been recurrent over the last two weeks. CT negative. MRI 06/30/12 Question tiny acute infarct left pontomedullary junction. Moderate white matter type changes most consistent with result of small vessel disease. MRA Mild intracranial  atherosclerotic type changes. His ABCD2 score was at least 5 with moderate risk of progression of TIA to stroke if indeed this is TIA though MRI shows questionable acute infarct.  Stroke protocol followed with results of echo, HA1C, and final US carotid pending, though preliminary US carotid negative. Troponin point of care negative x 1 and Troponin neg x 1.  We did frequent neurological checks.  Physical therapy evaluated and did not have recommendations.  OT evaluation was also ordered.  We continue Plavix 75 mg daily and Aspirin 81 mg daily as recommended by Neurology. We held home antihypertensives (Lisinopril, Lopressor) to allow for permissive hypertension but will resume at discharge. Of note the patient was taking Plavix alone prior to admission and he was temporarily out of Aspirin 81 mg but did take Asprin 81 mg and Plavix 75 mg on day of onset of symptoms. Neurology was consulted and he will have outpatient follow up in 1-2 months. Patient to  call since discharge is on the weekend if neurology does not call the patient.  Neuro followed. Dr. Roseanne Reno on admission and Dr. Anne Hahn saw the patient of day discharge without any other recommendations for work up currently. Patient is okay to go home today per Neurology and he does not have driving restrictions. Dr. Anne Hahn thought the patient did have a true small stroke.    2. Sinus bradycardia (asymtomatic)  Heart rate in the 50s this admission. He is on Lopressor 50 mg bid.  He appears asymptomatic.  We monitored via telemetry this admission.    3. History of CAD status stents x 3 (2008, 2011) We continued Aspirin 81 mg and Plavix 75 mg daily and increased Lipitor 40 mg to 80 mg nightly.  We continued his as need Nitroglycerin.  He held his Lisinopril 40 mg daily and Lopressor 50 mg bid due to #1. Will resume at discharge.  His Cardiologist is Dr. Nicki Guadalajara. Will fax discharge summary.    4. History of HTN, controlled  Blood pressure at [facility] was 168/93 on day of initial presentation.  We monitored his vital signs and allowed for permissive hypertension 24-48 hours.  We held his Lisinopril and Lopressor on admission until post discharge 07/02/12  5. History of dyslipidemia  Lipid Panel    Component  Value  Date/Time    CHOL  113  07/01/2012 0450    TRIG  114  07/01/2012 0450    HDL  28*  07/01/2012 0450    CHOLHDL  4.0  07/01/2012 0450    VLDL  23  07/01/2012 0450    LDLCALC  62  07/01/2012 0450    We continued Lipitor 40 mg but increased to 80 mg nightly.    6. History of OSA  Complaint with cpap nightly.   7. Hypokalemia  -3.4 on admission then 3.1 the second day of admission.  Given replacement.  Trend basic metabolic panel outpatient if still low he may need daily potassium supplementation.   8. DVT prophylaxis   -Sequential compression devices      Discharge Vitals:  BP 135/85  Pulse 59  Temp(Src) 97.6 F (36.4 C) (Oral)  Resp 20  Ht 5\' 11"  (1.803 m)  Wt 200 lb (90.719 kg)  BMI 27.91 kg/m2  SpO2 99%  Discharge physical exam:  Vitals reviewed.  General: walking around in the room, NAD, alert and oriented x 3  HEENT: [place]/at, no scleral icterus  Cardiac: bradycardic, no rubs, murmurs or gallops  Pulm: clear to auscultation bilaterally, no wheezes, rales, or rhonchi  Abd: soft, nontender, nondistended, BS present  Ext: warm and well perfused, no pedal edema  Neuro: alert and oriented X3, CN 2-12 intact, 5/5 strength upper and lower extremities b/l, intact sensation, coordination intact, heel shin intact, finger to nose intact     Discharge Labs:  Results for THERRON, SELLS (MRN [id number]) as of 07/01/2012 11:56  Ref. Range 06/30/2012 12:54 06/30/2012 13:11 07/01/2012 04:50  Sodium Latest Range: 135-145 mEq/L 140  139  Potassium Latest Range: 3.5-5.1 mEq/L 3.4 (L)  3.1 (L)  Chloride Latest Range: 96-112 mEq/L 103  101  CO2 Latest Range: 19-32 mEq/L 29  28  BUN Latest Range: 6-23 mg/dL 15  14  Creatinine Latest  Range: 0.50-1.35 mg/dL 7.82  9.56  Calcium Latest Range: 8.4-10.5 mg/dL 8.9  8.5  GFR calc non Af Amer Latest Range: >90 mL/min 85 (L)  72 (L)  GFR calc Af Denyse Dago Latest  Range: >90 mL/min >90  84 (L)  Glucose Latest Range: 70-99 mg/dL 88  91  Alkaline Phosphatase Latest Range: 39-117 U/L 69    Albumin Latest Range: 3.5-5.2 g/dL 3.7    AST Latest Range: 0-37 U/L 19    ALT Latest Range: 0-53 U/L 14    Total Protein Latest Range: 6.0-8.3 g/dL 7.1    Total Bilirubin Latest Range: 0.3-1.2 mg/dL 0.5    Troponin I Latest Range: <0.30 ng/mL <0.30    Troponin i, poc Latest Range: 0.00-0.08 ng/mL  0.00   Cholesterol Latest Range: 0-200 mg/dL   811  Triglycerides Latest Range: <150 mg/dL   914  HDL Latest Range: >39 mg/dL   28 (L)  LDL (calc) Latest Range: 0-99 mg/dL   62  VLDL Latest Range: 0-40 mg/dL   23  Total CHOL/HDL Ratio No range found   4.0    Results for AMDREW, OBOYLE (MRN [id number]) as of 07/01/2012 11:56  Ref. Range 06/30/2012 12:54 07/01/2012 04:50  WBC Latest Range: 4.0-10.5 K/uL 5.2 6.1  RBC Latest Range: 4.22-5.81 MIL/uL 4.66 4.41  Hemoglobin Latest Range: 13.0-17.0 g/dL 08.6 57.8  HCT Latest Range: 39.0-52.0 % 38.6 (L) 36.8 (L)  MCV Latest Range: 78.0-100.0 fL 82.8 83.4  MCH Latest Range: 26.0-34.0 pg 29.8 29.5  MCHC Latest Range: 30.0-36.0 g/dL 46.9 62.9  RDW Latest Range: 11.5-15.5 % 13.5 13.4  Platelets Latest Range: 150-400 K/uL 141 (L) 135 (L)  Neutrophils Relative Latest Range: 43-77 % 51   Lymphocytes Relative Latest Range: 12-46 % 40   Monocytes Relative Latest Range: 3-12 % 8   Eosinophils Relative Latest Range: 0-5 % 1   Basophils Relative Latest Range: 0-1 % 0   NEUT# Latest Range: 1.7-7.7 K/uL 2.7   Lymphocytes Absolute Latest Range: 0.7-4.0 K/uL 2.1   Monocytes Absolute Latest Range: 0.1-1.0 K/uL 0.4   Eosinophils Absolute Latest Range: 0.0-0.7 K/uL 0.1   Basophils Absolute Latest Range: 0.0-0.1 K/uL 0.0    Results for MAURO, ARPS (MRN [id number]) as of  07/01/2012 11:56  Ref. Range 06/30/2012 12:54  Prothrombin Time Latest Range: 11.6-15.2 seconds 14.1  INR Latest Range: 0.00-1.49  1.10  APTT Latest Range: 24-37 seconds 33   Results for orders placed during the hospital encounter of 06/30/12 (from the past 24 hour(s))  BASIC METABOLIC PANEL     Status: Abnormal   Collection Time    07/01/12  4:50 AM      Result Value Range   Sodium 139  135 - 145 mEq/L   Potassium 3.1 (*) 3.5 - 5.1 mEq/L   Chloride 101  96 - 112 mEq/L   CO2 28  19 - 32 mEq/L   Glucose, Bld 91  70 - 99 mg/dL   BUN 14  6 - 23 mg/dL   Creatinine, Ser 0.10  0.50 - 1.35 mg/dL   Calcium 8.5  8.4 - 27.2 mg/dL   GFR calc non Af Amer 72 (*) >90 mL/min   GFR calc Af Amer 84 (*) >90 mL/min  CBC     Status: Abnormal   Collection Time    07/01/12  4:50 AM      Result Value Range   WBC 6.1  4.0 - 10.5 K/uL   RBC 4.41  4.22 - 5.81 MIL/uL   Hemoglobin 13.0  13.0 - 17.0 g/dL   HCT 53.6 (*) 64.4 - 03.4 %   MCV 83.4  78.0 - 100.0 fL   MCH 29.5  26.0 - 34.0 pg  MCHC 35.3  30.0 - 36.0 g/dL   RDW 16.1  09.6 - 04.5 %   Platelets 135 (*) 150 - 400 K/uL  LIPID PANEL     Status: Abnormal   Collection Time    07/01/12  4:50 AM      Result Value Range   Cholesterol 113  0 - 200 mg/dL   Triglycerides 409  <811 mg/dL   HDL 28 (*) >91 mg/dL   Total CHOL/HDL Ratio 4.0     VLDL 23  0 - 40 mg/dL   LDL Cholesterol 62  0 - 99 mg/dL    Signed: Annett Gula 07/01/2012, 1:40 PM   Time Spent on Discharge: >30 minutes  Services Ordered on Discharge: none Equipment Ordered on Discharge: none

## 2012-07-01 NOTE — Progress Notes (Signed)
UR Completed Haydon Dorris Graves-Bigelow, RN,BSN [phone]  

## 2012-07-01 NOTE — Progress Notes (Signed)
Place patient on cpap per patient request. Full face mask and .21% 02. Patient  tolerating well.

## 2012-07-01 NOTE — Progress Notes (Signed)
Copy of lipid panel for patient   Results for Eugene Murray, Eugene Murray (MRN [id number]) as of 07/01/2012 11:56  Ref. Range 07/01/2012 04:50  Cholesterol Latest Range: 0-200 mg/dL 409  Triglycerides Latest Range: <150 mg/dL 811  HDL Latest Range: >39 mg/dL 28 (L)  LDL (calc) Latest Range: 0-99 mg/dL 62  VLDL Latest Range: 0-40 mg/dL 23  Total CHOL/HDL Ratio No range found 4.0

## 2012-07-01 NOTE — Evaluation (Signed)
Physical Therapy Evaluation Patient Details Name: Eugene Murray MRN: [id number] DOB: May 29, 1940 Today's Date: 07/01/2012 Time: 7829-5621 PT Time Calculation (min): 16 min  PT Assessment / Plan / Recommendation Clinical Impression  72 yo male admitted with transient right UE and LE weakness.  Pt feels that he has returned to normal state.  He is independent in mobility and gait on levels and steps with no assistive device. He does not need PT follow up or DME    PT Assessment  Patent does not need any further PT services    Follow Up Recommendations  No PT follow up    Does the patient have the potential to tolerate intense rehabilitation      Barriers to Discharge        Equipment Recommendations       Recommendations for Other Services     Frequency      Precautions / Restrictions     Pertinent Vitals/Pain No c/o pain, no dyspnea on exertion      Mobility  Bed Mobility Bed Mobility: Supine to Sit;Sit to Supine Supine to Sit: 7: Independent Sit to Supine: 7: Independent Transfers Transfers: Sit to Stand;Stand to Sit Sit to Stand: 7: Independent Stand to Sit: 7: Independent Ambulation/Gait Ambulation/Gait Assistance: 7: Independent Ambulation Distance (Feet): 150 Feet Assistive device: None Stairs: Yes Stairs Assistance: 6: Modified independent (Device/Increase time) Stair Management Technique: One rail Right;Alternating pattern Number of Stairs: 4 Wheelchair Mobility Wheelchair Mobility: No    Exercises General Exercises - Lower Extremity Hip ABduction/ADduction: AROM;10 reps;Both;Standing;Other (comment) (encouragaed erect trunk/core activation)   PT Diagnosis:    PT Problem List:   PT Treatment Interventions:     PT Goals    Visit Information  Last PT Received On: 07/01/12    Subjective Data  Subjective: pt states he had a "spell" yesterday morning, but he is OK now Patient Stated Goal: to go home   Prior Functioning  Home Living Lives With:  Spouse Available Help at Discharge: Family Type of Home: House Home Layout: Two level Home Adaptive Equipment: None Prior Function Level of Independence: Independent Able to Take Stairs?: Yes Communication Communication: No difficulties    Cognition  Cognition Arousal/Alertness: Awake/alert Behavior During Therapy: WFL for tasks assessed/performed Overall Cognitive Status: Within Functional Limits for tasks assessed    Extremity/Trunk Assessment Right Lower Extremity Assessment RLE ROM/Strength/Tone: Within functional levels RLE Sensation: WFL - Light Touch;WFL - Proprioception RLE Coordination: WFL - gross/fine motor Left Lower Extremity Assessment LLE ROM/Strength/Tone: Within functional levels LLE Sensation: WFL - Light Touch;WFL - Proprioception LLE Coordination: WFL - gross/fine motor Trunk Assessment Trunk Assessment: Normal   Balance Standardized Balance Assessment Standardized Balance Assessment: Dynamic Gait Index Dynamic Gait Index Level Surface: Normal Change in Gait Speed: Normal Gait with Horizontal Head Turns: Normal Gait with Vertical Head Turns: Normal Gait and Pivot Turn: Normal Step Over Obstacle: Normal Step Around Obstacles: Normal Steps: Normal Total Score: 24  End of Session PT - End of Session Activity Tolerance: Patient tolerated treatment well Patient left: in bed Nurse Communication: Mobility status  GP Functional Assessment Tool Used: Dynamic Gait Index Functional Limitation: Mobility: Walking and moving around Mobility: Walking and Moving Around Current Status (H0865): 0 percent impaired, limited or restricted Mobility: Walking and Moving Around Discharge Status (H8469): 0 percent impaired, limited or restricted   Donnetta Hail 07/01/2012, 8:47 AM

## 2012-07-01 NOTE — Evaluation (Signed)
Occupational Therapy Evaluation Patient Details Name: Eugene Murray MRN: [id number] DOB: [birth year] Today's Date: 07/01/2012 Time: 4098-1191 OT Time Calculation (min): 8 min  OT Assessment / Plan / Recommendation Clinical Impression  72 yo male admitted with rigth himparesis NIH zero and MRI revelas acute infarct left pontonmedullary juntion that does not require acute OT . ot to sign off. Recommend no follow up    OT Assessment  Patient does not need any further OT services    Follow Up Recommendations  No OT follow up    Barriers to Discharge      Equipment Recommendations  None recommended by OT    Recommendations for Other Services    Frequency       Precautions / Restrictions Precautions Precautions: None   Pertinent Vitals/Pain None reported    ADL  Lower Body Dressing: Independent Where Assessed - Lower Body Dressing: Unsupported sitting Toilet Transfer: Modified independent Toilet Transfer Method: Sit to stand Toilet Transfer Equipment: Regular height toilet Transfers/Ambulation Related to ADLs: Ambulating Mod I and able to incr speed when testing gait velocity. pt able to stop on command with LOB ADL Comments: Pt is at or near baseline at this time. Ot to sign off acutely    OT Diagnosis:    OT Problem List:   OT Treatment Interventions:     OT Goals    Visit Information  Last OT Received On: 07/01/12 Assistance Needed: +1    Subjective Data  Subjective: I was actually packing to go to [facility] when all this happen Patient Stated Goal: to go to visit in [place]   Prior Functioning     Home Living Lives With: Spouse Available Help at Discharge: Family Type of Home: House Home Layout: Two level Bathroom Shower/Tub: Tub/shower unit;Walk-in shower;Curtain Bathroom Toilet: Standard Home Adaptive Equipment: None Prior Function Level of Independence: Independent Able to Take Stairs?: Yes Driving: Yes Communication Communication: No  difficulties Dominant Hand: Right         Vision/Perception Vision - History Baseline Vision: Wears glasses all the time Patient Visual Report: No change from baseline Vision - Assessment Eye Alignment: Within Functional Limits Vision Assessment: Vision tested Ocular Range of Motion: Within Functional Limits Alignment/Gaze Preference: Within Defined Limits Tracking/Visual Pursuits: Able to track stimulus in all quads without difficulty Convergence: Within functional limits   Cognition  Cognition Arousal/Alertness: Awake/alert Behavior During Therapy: WFL for tasks assessed/performed Overall Cognitive Status: Within Functional Limits for tasks assessed    Extremity/Trunk Assessment Right Upper Extremity Assessment RUE ROM/Strength/Tone: [facility] for tasks assessed Left Upper Extremity Assessment LUE ROM/Strength/Tone: WFL for tasks assessed Trunk Assessment Trunk Assessment: Normal     Mobility Transfers Sit to Stand: 6: Modified independent (Device/Increase time) Stand to Sit: 6: Modified independent (Device/Increase time)     Exercise     Balance High Level Balance High Level Balance Activites: Backward walking;Direction changes;Sudden stops   End of Session OT - End of Session Activity Tolerance: Patient tolerated treatment well Patient left:  (standing in room organizing items) Nurse Communication: Mobility status  GO Functional Assessment Tool Used: clinical observation Functional Limitation: Self care Self Care Current Status (Y7829): 0 percent impaired, limited or restricted Self Care Goal Status (F6213): 0 percent impaired, limited or restricted Self Care Discharge Status [phone]): 0 percent impaired, limited or restricted   Pt ambulated > 100 ft with OT  Completed transfer into tub MOD I Pt at or near baseline  Eugene Murray [facility] 07/01/2012, 1:08 PM Pager: [phone]

## 2012-07-01 NOTE — Progress Notes (Addendum)
Subjective: Patient denies complaints (i.e sob, chest pain, abdominal pain).  He right arm and leg weakness, dragging his right foot has resolved.  He still has chronic right ankle weakness.   His slowed speech is improving.    Objective: Vital signs in last 24 hours: Filed Vitals:   07/01/12 0201 07/01/12 0404 07/01/12 0606 07/01/12 0920  BP: 139/80 134/76 137/68 135/85  Pulse: 59 58 60 59  Temp: 97.9 F (36.6 C) 98.7 F (37.1 C) 98.3 F (36.8 C) 97.6 F (36.4 C)  TempSrc: Oral Oral Oral Oral  Resp: 20 20 18 20   Height:      Weight:      SpO2: 98% 97% 98% 99%   Weight change:   Intake/Output Summary (Last 24 hours) at 07/01/12 1150 Last data filed at 07/01/12 0800  Gross per 24 hour  Intake    360 ml  Output      0 ml  Net    360 ml   Vitals reviewed. General: walking around in the room, NAD, alert and oriented x 3 HEENT: [place]/at, no scleral icterus Cardiac: bradycardic, no rubs, murmurs or gallops Pulm: clear to auscultation bilaterally, no wheezes, rales, or rhonchi Abd: soft, nontender, nondistended, BS present Ext: warm and well perfused, no pedal edema Neuro: alert and oriented X3, CN 2-12 intact, 5/5 strength upper and lower extremities b/l, intact sensation, coordination intact, heel shin intact, finger to nose intact   Lab Results: Basic Metabolic Panel:  Recent Labs Lab 06/30/12 1254 07/01/12 0450  NA 140 139  K 3.4* 3.1*  CL 103 101  CO2 29 28  GLUCOSE 88 91  BUN 15 14  CREATININE 0.86 1.01  CALCIUM 8.9 8.5   Liver Function Tests:  Recent Labs Lab 06/30/12 1254  AST 19  ALT 14  ALKPHOS 69  BILITOT 0.5  PROT 7.1  ALBUMIN 3.7   CBC:  Recent Labs Lab 06/30/12 1254 07/01/12 0450  WBC 5.2 6.1  NEUTROABS 2.7  --   HGB 13.9 13.0  HCT 38.6* 36.8*  MCV 82.8 83.4  PLT 141* 135*   Cardiac Enzymes:  Recent Labs Lab 06/30/12 1254  TROPONINI <0.30   CBG:  Recent Labs Lab 06/30/12 1255  GLUCAP 91   Hemoglobin A1C: No results  found for this basename: HGBA1C,  in the last 168 hours Fasting Lipid Panel:  Recent Labs Lab 07/01/12 0450  CHOL 113  HDL 28*  LDLCALC 62  TRIG 161  CHOLHDL 4.0   Coagulation:  Recent Labs Lab 06/30/12 1254  LABPROT 14.1  INR 1.10   Misc. Labs: HA1C  Micro Results: No results found for this or any previous visit (from the past 240 hour(s)). Studies/Results: Ct Head (brain) Wo Contrast  06/30/2012  *RADIOLOGY REPORT*  Clinical Data: Acute onset right upper and lower extremity weakness.  Right-sided foot drop.  CT HEAD WITHOUT CONTRAST  Technique:  Contiguous axial images were obtained from the base of the skull through the vertex without contrast.  Comparison: None.  Findings: No evidence of acute infarct, acute hemorrhage, mass lesion, mass effect or hydrocephalus.  Minimal periventricular low attenuation.  Small retention cysts or polyps are seen in the left maxillary sinus.  IMPRESSION:  1.  No acute findings. 2.  Minimal chronic microvascular white matter ischemic changes.   Original Report Authenticated By: Leanna Battles, M.D.    Mr Angiogram Head Wo Contrast  06/30/2012  *RADIOLOGY REPORT*  Clinical Data:  Right-sided weakness.  Hypertension.  History prostate cancer.  MRI BRAIN WITHOUT CONTRAST MRA HEAD WITHOUT CONTRAST  Technique: Multiplanar, multiecho pulse sequences of the brain and surrounding structures were obtained according to standard protocol without intravenous contrast.  Angiographic images of the head were obtained using MRA technique without contrast.  Comparison: 06/30/2012 head CT.  No comparison brain MR.  MRI HEAD  Findings:  Question tiny acute infarct left pontomedullary junction.  Moderate white matter type changes most consistent with result of small vessel disease.  No intracranial hemorrhage.  No hydrocephalus.  No intracranial mass lesion detected on this unenhanced exam.  Major intracranial vascular structures are patent.  Upper cervical cord is of  decreased caliber of questionable significance/etiology.  Cervical medullary junction, pituitary region and pineal region unremarkable.  Exophthalmos.  IMPRESSION: Question tiny acute infarct left pontomedullary junction.  Moderate white matter type changes most consistent with result of small vessel disease.  Please see above  MRA HEAD  Findings: Anterior circulation without medium or large size vessel significant stenosis or occlusion.  Middle cerebral artery mild branch vessel irregularity bilaterally.  Mild narrowing distal M1 segment left middle cerebral artery.  Ectatic vertebral arteries and basilar artery.  Nonvisualization left PICA and right AICA.  Mild branch vessel irregularity superior cerebellar artery and posterior cerebral artery bilaterally.  No aneurysm or vascular malformation noted.  IMPRESSION: Mild intracranial atherosclerotic type changes as detailed above the   Original Report Authenticated By: Lacy Duverney, M.D.    Mr Brain Wo Contrast  06/30/2012  *RADIOLOGY REPORT*  Clinical Data:  Right-sided weakness.  Hypertension.  History prostate cancer.  MRI BRAIN WITHOUT CONTRAST MRA HEAD WITHOUT CONTRAST  Technique: Multiplanar, multiecho pulse sequences of the brain and surrounding structures were obtained according to standard protocol without intravenous contrast.  Angiographic images of the head were obtained using MRA technique without contrast.  Comparison: 06/30/2012 head CT.  No comparison brain MR.  MRI HEAD  Findings:  Question tiny acute infarct left pontomedullary junction.  Moderate white matter type changes most consistent with result of small vessel disease.  No intracranial hemorrhage.  No hydrocephalus.  No intracranial mass lesion detected on this unenhanced exam.  Major intracranial vascular structures are patent.  Upper cervical cord is of decreased caliber of questionable significance/etiology.  Cervical medullary junction, pituitary region and pineal region unremarkable.   Exophthalmos.  IMPRESSION: Question tiny acute infarct left pontomedullary junction.  Moderate white matter type changes most consistent with result of small vessel disease.  Please see above  MRA HEAD  Findings: Anterior circulation without medium or large size vessel significant stenosis or occlusion.  Middle cerebral artery mild branch vessel irregularity bilaterally.  Mild narrowing distal M1 segment left middle cerebral artery.  Ectatic vertebral arteries and basilar artery.  Nonvisualization left PICA and right AICA.  Mild branch vessel irregularity superior cerebellar artery and posterior cerebral artery bilaterally.  No aneurysm or vascular malformation noted.  IMPRESSION: Mild intracranial atherosclerotic type changes as detailed above the   Original Report Authenticated By: Lacy Duverney, M.D.    Medications: Scheduled Meds: . aspirin EC  81 mg Oral Daily  . atorvastatin  80 mg Oral q1800  . clopidogrel  75 mg Oral Q breakfast  . potassium chloride  20 mEq Oral Q4H  . sodium chloride  3 mL Intravenous Q12H  . sodium chloride  3 mL Intravenous Q12H   Continuous Infusions:  PRN Meds:.sodium chloride, nitroGLYCERIN, ondansetron (ZOFRAN) IV, ondansetron, senna-docusate, sodium chloride Assessment/Plan: 72 y.o. male who presented with right arm and leg weakness. slowed  speech for workup for TIA/stroke.   1. Tiny acute infarct left pontomedullary junction  -Risk factors include HTN, dyslipidemia, age, family history. On admission symptoms improved significantly since onset. Some symptoms have been recurrent over the last two weeks.  -CT negative. MRI 06/30/12 Question tiny acute infarct left pontomedullary junction. Moderate white matter type changes most consistent with result of small vessel disease. MRA Mild intracranial atherosclerotic type changes. His ABCD2 score was is at least 5 with moderate risk of progression of TIA to stroke if indeed this is TIA -pending echo and HA1C, US carotid  negative. Troponin POC negative x 1 and Trop neg x 1  -Neuro checks  -PT-no recommendations; OT no recommendations post discharge -Continue Plavix 75 mg qd and Aspirin 81 mg qd. Hold home antihypertensives (Lisinopril, Lopressor). Resume at discharge 07/02/12. Of note the patient was taking Plavix alone prior to admission and he was temporarily out of Aspirin 81 mg but did take Asprin 81 mg on day of onset of symptoms.  -Neuro following. Dr. Roseanne Reno on admission and Dr. Anne Hahn saw the patient of day discharge without any other recommendations for work up currently. Patient is okay to go home today and he does not have driving restrictions. Dr. Anne Hahn thought the patient did have a true small stroke.   2. Sinus bradycardia  -HR in the 50s. He is on Lopressor 50 mg bid.  -Appears asymptomatic  -Monitor via telemetry.   3. History of CAD s/p stents x 3  -Continue ASA 81 mg qd, Lipitor 40 mg qhs, Plavix 75 mg qd, prn NTG  -hold Lisinopril 40 mg qd, Lopressor 50 mg bid due to #1.  Will resume at discharge.   -Cardiologist is Dr. Nicki Guadalajara. Will fax discharge summary   4. History of HTN, controlled  -BP at [facility] was 168/93.  -Monitor VS, permissive HTN 24-48 hours  -Holding Lisinopril and Lopressor until post discharge.   5. History of dyslipidemia  Lipid Panel     Component Value Date/Time   CHOL 113 07/01/2012 0450   TRIG 114 07/01/2012 0450   HDL 28* 07/01/2012 0450   CHOLHDL 4.0 07/01/2012 0450   VLDL 23 07/01/2012 0450   LDLCALC 62 07/01/2012 0450    Continue Lipitor 40 mg qhs increased to 80 mg qhs   6. History of OSA  -cpap qhs   7. F/E/N  -NSL  -Hypokalemia (3.1)-Kdur 20 meQ q 4 hours x 3 doses  -Regular diet   8. DVT px  -scds   Dispo: possibly today or tomorrow pending Neuro recommendations    The patient does have a current PCP (Burtis Junes, MD), therefore will be requiring OPC follow-up after discharge.   The patient does not have transportation  limitations that hinder transportation to clinic appointments.  .Services Needed at time of discharge: Y = Yes, Blank = No PT: none  OT: Pending   RN:   Equipment:   Other:     LOS: 1 day   Annett Gula [phone] 07/01/2012, 11:50 AM

## 2012-07-01 NOTE — Progress Notes (Signed)
Stroke Team Progress Note  HISTORY Eugene Murray is a 72 y.o. male with a history of hypertension, obstructive sleep apnea, coronary artery disease and prostate cancer, presenting with transient weakness involving right arm and right leg. Onset was at 7:30 AM 06/30/12. He's had similar symptoms of weakness involving his right arm over the last week. He also noticed slowness in his speech but no frank dysarthria. This has occurred on multiple occasions as well. There is no previous history of stroke. Patient is on Plavix and aspirin but had not taken aspirin for about 5 days prior to onset of current symptoms. CT scan of his head showed no acute intracranial abnormality. MRI showed an equivocal tiny acute left pontomedullary junction stroke. MRI showed mild atherosclerotic changes but was otherwise unremarkable. NIH stroke score was 0.   LSN: 7:30 AM on 06/30/2012  tPA Given: No: Rapid resolution of symptoms  MRankin: 0  SUBJECTIVE No family members present this morning. The patient has no complaints. The right hemiparesis has resolved.  OBJECTIVE Most recent Vital Signs: Filed Vitals:   07/01/12 0012 07/01/12 0201 07/01/12 0404 07/01/12 0606  BP: 128/75 139/80 134/76 137/68  Pulse: 54 59 58 60  Temp: 98.1 F (36.7 C) 97.9 F (36.6 C) 98.7 F (37.1 C) 98.3 F (36.8 C)  TempSrc: Oral Oral Oral Oral  Resp: 20 20 20 18   Height:      Weight:      SpO2: 96% 98% 97% 98%   CBG (last 3)   Recent Labs  06/30/12 1255  GLUCAP 91    IV Fluid Intake:     MEDICATIONS  . aspirin EC  81 mg Oral Daily  . atorvastatin  80 mg Oral q1800  . clopidogrel  75 mg Oral Q breakfast  . potassium chloride  20 mEq Oral Q4H  . sodium chloride  3 mL Intravenous Q12H  . sodium chloride  3 mL Intravenous Q12H   PRN:  sodium chloride, nitroGLYCERIN, ondansetron (ZOFRAN) IV, ondansetron, senna-docusate, sodium chloride  Diet:  Cardiac Thin liquids Activity:  Up with assistance DVT Prophylaxis:   SCD's  CLINICALLY SIGNIFICANT STUDIES Basic Metabolic Panel:  Recent Labs Lab 06/30/12 1254 07/01/12 0450  NA 140 139  K 3.4* 3.1*  CL 103 101  CO2 29 28  GLUCOSE 88 91  BUN 15 14  CREATININE 0.86 1.01  CALCIUM 8.9 8.5   Liver Function Tests:  Recent Labs Lab 06/30/12 1254  AST 19  ALT 14  ALKPHOS 69  BILITOT 0.5  PROT 7.1  ALBUMIN 3.7   CBC:  Recent Labs Lab 06/30/12 1254 07/01/12 0450  WBC 5.2 6.1  NEUTROABS 2.7  --   HGB 13.9 13.0  HCT 38.6* 36.8*  MCV 82.8 83.4  PLT 141* 135*   Coagulation:  Recent Labs Lab 06/30/12 1254  LABPROT 14.1  INR 1.10   Cardiac Enzymes:  Recent Labs Lab 06/30/12 1254  TROPONINI <0.30   Urinalysis: No results found for this basename: COLORURINE, APPERANCEUR, LABSPEC, PHURINE, GLUCOSEU, HGBUR, BILIRUBINUR, KETONESUR, PROTEINUR, UROBILINOGEN, NITRITE, LEUKOCYTESUR,  in the last 168 hours Lipid Panel    Component Value Date/Time   CHOL 113 07/01/2012 0450   TRIG 114 07/01/2012 0450   HDL 28* 07/01/2012 0450   CHOLHDL 4.0 07/01/2012 0450   VLDL 23 07/01/2012 0450   LDLCALC 62 07/01/2012 0450   HgbA1C  No results found for this basename: HGBA1C    Urine Drug Screen:   No results found for this basename: labopia,  cocainscrnur, labbenz, amphetmu, thcu, labbarb    Alcohol Level: No results found for this basename: ETH,  in the last 168 hours  Ct Head (brain) Wo Contrast 06/30/12 1.  No acute findings. 2.  Minimal chronic microvascular white matter ischemic changes.    Mr Angiogram Head Wo Contrast 06/30/12 Mild intracranial atherosclerotic type changes as detailed above  the   Mr Brain Wo Contrast 07/20/12 Question tiny acute infarct left pontomedullary junction.  Moderate white matter type changes most consistent with result of small vessel disease.  2D Echocardiogram  pending  Carotid Doppler   Preliminary report: Bilateral: No evidence of hemodynamically significant internal carotid artery stenosis. Vertebral artery  flow is antegrade   CXR    EKG  pending  Therapy Recommendations pending  Physical Exam  General - pleasant 72 year old male in no acute distress. Heart - Regular rate and rhythm - no murmer Lungs - Clear to auscultation Abdomen - Soft - non tender Extremities - No edema Skin - Warm and dry  NEUROLOGIC:   MENTAL STATUS: awake, alert, oriented, language fluent,  follows simple commands.  CRANIAL NERVES: pupils equal and reactive to light,extraocular muscles intact, facial sensation and strength symmetric, uvula midlinec, tongue midline MOTOR: normal bulk and tone, Strength - 5/5 throughout. SENSORY: normal and symmetric to light touch  COORDINATION: finger-nose-finger normal    ASSESSMENT Eugene Murray is a 72 y.o. male presenting with transient right hemiparesis. TPA was not given as the patient's symptoms had resolved. Imaging confirms a question of a  tiny acute infarct left pontomedullary junction. Infarct felt to be thrombotic  Secondary to small vessel disease.  On aspirin 81 mg daily and Plavix 75 mg daily prior to admission. Now on aspirin 81 mg orally every day and clopidogrel 75 mg orally every day for secondary stroke prevention. Patient with resultant resolution of symptoms. Work up underway.   History of hyperlipidemia on Lipitor prior to admission. Cholesterol 113. LDL 62. At goal.  Hypertension history  Coronary artery disease with previous stents.  The patient had recently ran out of aspirin and had been on Plavix alone when he developed symptoms.   Hospital day # 1  MRI reveals evidence of a small brain stem infarct. The patient likely has had a small vessel event, and antiplatelet agents are indicated. The patient could be discharged later this evening. The patient is back to his baseline, symptoms lasted only a few minutes.  TREATMENT/PLAN  Continue aspirin 81 mg orally every day and clopidogrel 75 mg orally every day for secondary stroke  prevention.  Await 2-D echo, and EKG.  Await therapist's evaluations. Likely no need for physical or occupational therapy.   Delton See PA-C [facility] Pager [phone] 07/01/2012, 8:26 AM  I have personally obtained a history, examined the patient, evaluated imaging results, and formulated the assessment and plan of care. I agree with the above. Lesly Dukes

## 2012-07-01 NOTE — Progress Notes (Signed)
VASCULAR LAB PRELIMINARY  PRELIMINARY  PRELIMINARY  PRELIMINARY  Carotid duplex completed.    Preliminary report:  Bilateral:  No evidence of hemodynamically significant internal carotid artery stenosis.   Vertebral artery flow is antegrade.     Sherra Kimmons, RVT 07/01/2012, 10:37 AM

## 2012-07-01 NOTE — H&P (Signed)
Internal Medicine Teaching Service Attending Note Date: 07/01/2012  Patient name: Eugene Murray  Medical record number: [id number]  Date of birth: 01-Jan-1941   I have seen and evaluated [name] and discussed their care with the Residency Team.    Mr. Cherian is a 72yo man with PMH of HTN, CAD who presented to the hospital after experiencing right arm and leg weakness and some slowed speech.  Mr. Gair reports that these symptoms began on day of admission while he was packing for a trip to go to [place], [place].  They lasted about 15 minutes. He took his aspirin and plavix and rested and the symptoms improved.  He had previously (Wednesday of this week) had an episode of feeling like his speech was slowed, which lasted about 5 minutes.  Previous to all this, he has been noticing some increased dizziness.  Associated symptoms include gait imbalance and feeling like his right foot was dragging.  He came in to be evaluated due to the confluence of these issues.  Mr. Ambs reported to the Neurology PA that he had recently been out of his aspirin.    He denied any other symptoms, including CP, recent illness, fever, chills, SOB.  He has a family history of stroke in his mother.  He smoked, but quit a long time ago.   For further PMH, PSH, meds, allergies, ROS, please see resident note.   Physical Exam: Blood pressure 135/85, pulse 59, temperature 97.6 F (36.4 C), temperature source Oral, resp. rate 20, height 5\' 11"  (1.803 m), weight 200 lb (90.719 kg), SpO2 99.00%. General appearance: alert, cooperative, appears stated age and no distress Head: Normocephalic, without obvious abnormality, atraumatic Eyes: EOMI, anicteric sclerae, wearing glasses Lungs: clear to auscultation bilaterally and no wheezing Heart: RR, NR, no murmur Abdomen: + BS, soft Extremities: thin, no edema Neurologic: Grossly normal when I saw him, gait normal, coordination normal, CN grossly intact, no weakness of extremities or loss  of sensation.   Lab results: Results for orders placed during the hospital encounter of 06/30/12 (from the past 24 hour(s))  PROTIME-INR     Status: None   Collection Time    06/30/12 12:54 PM      Result Value Range   Prothrombin Time 14.1  11.6 - 15.2 seconds   INR 1.10  0.00 - 1.49  APTT     Status: None   Collection Time    06/30/12 12:54 PM      Result Value Range   aPTT 33  24 - 37 seconds  CBC     Status: Abnormal   Collection Time    06/30/12 12:54 PM      Result Value Range   WBC 5.2  4.0 - 10.5 K/uL   RBC 4.66  4.22 - 5.81 MIL/uL   Hemoglobin 13.9  13.0 - 17.0 g/dL   HCT 40.9 (*) 81.1 - 91.4 %   MCV 82.8  78.0 - 100.0 fL   MCH 29.8  26.0 - 34.0 pg   MCHC 36.0  30.0 - 36.0 g/dL   RDW 78.2  95.6 - 21.3 %   Platelets 141 (*) 150 - 400 K/uL  DIFFERENTIAL     Status: None   Collection Time    06/30/12 12:54 PM      Result Value Range   Neutrophils Relative 51  43 - 77 %   Neutro Abs 2.7  1.7 - 7.7 K/uL   Lymphocytes Relative 40  12 - 46 %  Lymphs Abs 2.1  0.7 - 4.0 K/uL   Monocytes Relative 8  3 - 12 %   Monocytes Absolute 0.4  0.1 - 1.0 K/uL   Eosinophils Relative 1  0 - 5 %   Eosinophils Absolute 0.1  0.0 - 0.7 K/uL   Basophils Relative 0  0 - 1 %   Basophils Absolute 0.0  0.0 - 0.1 K/uL  COMPREHENSIVE METABOLIC PANEL     Status: Abnormal   Collection Time    06/30/12 12:54 PM      Result Value Range   Sodium 140  135 - 145 mEq/L   Potassium 3.4 (*) 3.5 - 5.1 mEq/L   Chloride 103  96 - 112 mEq/L   CO2 29  19 - 32 mEq/L   Glucose, Bld 88  70 - 99 mg/dL   BUN 15  6 - 23 mg/dL   Creatinine, Ser 4.09  0.50 - 1.35 mg/dL   Calcium 8.9  8.4 - 81.1 mg/dL   Total Protein 7.1  6.0 - 8.3 g/dL   Albumin 3.7  3.5 - 5.2 g/dL   AST 19  0 - 37 U/L   ALT 14  0 - 53 U/L   Alkaline Phosphatase 69  39 - 117 U/L   Total Bilirubin 0.5  0.3 - 1.2 mg/dL   GFR calc non Af Amer 85 (*) >90 mL/min   GFR calc Af Amer >90  >90 mL/min  TROPONIN I     Status: None   Collection  Time    06/30/12 12:54 PM      Result Value Range   Troponin I <0.30  <0.30 ng/mL  GLUCOSE, CAPILLARY     Status: None   Collection Time    06/30/12 12:55 PM      Result Value Range   Glucose-Capillary 91  70 - 99 mg/dL   Comment 1 Documented in Chart    POCT I-STAT TROPONIN I     Status: None   Collection Time    06/30/12  1:11 PM      Result Value Range   Troponin i, poc 0.00  0.00 - 0.08 ng/mL   Comment 3           BASIC METABOLIC PANEL     Status: Abnormal   Collection Time    07/01/12  4:50 AM      Result Value Range   Sodium 139  135 - 145 mEq/L   Potassium 3.1 (*) 3.5 - 5.1 mEq/L   Chloride 101  96 - 112 mEq/L   CO2 28  19 - 32 mEq/L   Glucose, Bld 91  70 - 99 mg/dL   BUN 14  6 - 23 mg/dL   Creatinine, Ser 9.14  0.50 - 1.35 mg/dL   Calcium 8.5  8.4 - 78.2 mg/dL   GFR calc non Af Amer 72 (*) >90 mL/min   GFR calc Af Amer 84 (*) >90 mL/min  CBC     Status: Abnormal   Collection Time    07/01/12  4:50 AM      Result Value Range   WBC 6.1  4.0 - 10.5 K/uL   RBC 4.41  4.22 - 5.81 MIL/uL   Hemoglobin 13.0  13.0 - 17.0 g/dL   HCT 95.6 (*) 21.3 - 08.6 %   MCV 83.4  78.0 - 100.0 fL   MCH 29.5  26.0 - 34.0 pg   MCHC 35.3  30.0 - 36.0 g/dL   RDW  13.4  11.5 - 15.5 %   Platelets 135 (*) 150 - 400 K/uL  LIPID PANEL     Status: Abnormal   Collection Time    07/01/12  4:50 AM      Result Value Range   Cholesterol 113  0 - 200 mg/dL   Triglycerides 161  <096 mg/dL   HDL 28 (*) >04 mg/dL   Total CHOL/HDL Ratio 4.0     VLDL 23  0 - 40 mg/dL   LDL Cholesterol 62  0 - 99 mg/dL    Imaging results:  Ct Head (brain) Wo Contrast  06/30/2012  *RADIOLOGY REPORT*  Clinical Data: Acute onset right upper and lower extremity weakness.  Right-sided foot drop.  CT HEAD WITHOUT CONTRAST  Technique:  Contiguous axial images were obtained from the base of the skull through the vertex without contrast.  Comparison: None.  Findings: No evidence of acute infarct, acute hemorrhage, mass  lesion, mass effect or hydrocephalus.  Minimal periventricular low attenuation.  Small retention cysts or polyps are seen in the left maxillary sinus.  IMPRESSION:  1.  No acute findings. 2.  Minimal chronic microvascular white matter ischemic changes.   Original Report Authenticated By: Leanna Battles, M.D.    Mr Angiogram Head Wo Contrast  06/30/2012  *RADIOLOGY REPORT*  Clinical Data:  Right-sided weakness.  Hypertension.  History prostate cancer.  MRI BRAIN WITHOUT CONTRAST MRA HEAD WITHOUT CONTRAST  Technique: Multiplanar, multiecho pulse sequences of the brain and surrounding structures were obtained according to standard protocol without intravenous contrast.  Angiographic images of the head were obtained using MRA technique without contrast.  Comparison: 06/30/2012 head CT.  No comparison brain MR.  MRI HEAD  Findings:  Question tiny acute infarct left pontomedullary junction.  Moderate white matter type changes most consistent with result of small vessel disease.  No intracranial hemorrhage.  No hydrocephalus.  No intracranial mass lesion detected on this unenhanced exam.  Major intracranial vascular structures are patent.  Upper cervical cord is of decreased caliber of questionable significance/etiology.  Cervical medullary junction, pituitary region and pineal region unremarkable.  Exophthalmos.  IMPRESSION: Question tiny acute infarct left pontomedullary junction.  Moderate white matter type changes most consistent with result of small vessel disease.  Please see above  MRA HEAD  Findings: Anterior circulation without medium or large size vessel significant stenosis or occlusion.  Middle cerebral artery mild branch vessel irregularity bilaterally.  Mild narrowing distal M1 segment left middle cerebral artery.  Ectatic vertebral arteries and basilar artery.  Nonvisualization left PICA and right AICA.  Mild branch vessel irregularity superior cerebellar artery and posterior cerebral artery bilaterally.   No aneurysm or vascular malformation noted.  IMPRESSION: Mild intracranial atherosclerotic type changes as detailed above the   Original Report Authenticated By: Lacy Duverney, M.D.    Mr Brain Wo Contrast  06/30/2012  *RADIOLOGY REPORT*  Clinical Data:  Right-sided weakness.  Hypertension.  History prostate cancer.  MRI BRAIN WITHOUT CONTRAST MRA HEAD WITHOUT CONTRAST  Technique: Multiplanar, multiecho pulse sequences of the brain and surrounding structures were obtained according to standard protocol without intravenous contrast.  Angiographic images of the head were obtained using MRA technique without contrast.  Comparison: 06/30/2012 head CT.  No comparison brain MR.  MRI HEAD  Findings:  Question tiny acute infarct left pontomedullary junction.  Moderate white matter type changes most consistent with result of small vessel disease.  No intracranial hemorrhage.  No hydrocephalus.  No intracranial mass lesion detected on this  unenhanced exam.  Major intracranial vascular structures are patent.  Upper cervical cord is of decreased caliber of questionable significance/etiology.  Cervical medullary junction, pituitary region and pineal region unremarkable.  Exophthalmos.  IMPRESSION: Question tiny acute infarct left pontomedullary junction.  Moderate white matter type changes most consistent with result of small vessel disease.  Please see above  MRA HEAD  Findings: Anterior circulation without medium or large size vessel significant stenosis or occlusion.  Middle cerebral artery mild branch vessel irregularity bilaterally.  Mild narrowing distal M1 segment left middle cerebral artery.  Ectatic vertebral arteries and basilar artery.  Nonvisualization left PICA and right AICA.  Mild branch vessel irregularity superior cerebellar artery and posterior cerebral artery bilaterally.  No aneurysm or vascular malformation noted.  IMPRESSION: Mild intracranial atherosclerotic type changes as detailed above the   Original  Report Authenticated By: Lacy Duverney, M.D.     Assessment and Plan: I agree with the formulated Assessment and Plan with the following changes:   1. TIA vs. Stroke - Mr. Moccio's symptoms are most consistent with a TIA as they completed resolved.  - MRI revealed a questionable acute infarct in teh left pontomedullary junction - TTE, CD epnding - A1C, lipid panel checked - Neurology consulted - Lipitor increased to 80mg , aspirin/plavix - PT/OT recs - Hold antihypertensives in the acute setting  Other issues per resident note.  He has other chronic medical issues which are currently stable.   Inez Catalina, MD 5/3/201410:22 AM

## 2012-07-02 NOTE — Discharge Summary (Signed)
I saw Mr. Tapanes on day of discharge and agree with above stated plan.

## 2012-07-12 ENCOUNTER — Telehealth: Payer: Self-pay | Admitting: Neurology

## 2012-07-16 ENCOUNTER — Other Ambulatory Visit: Payer: Self-pay | Admitting: Cardiovascular Disease

## 2012-07-17 ENCOUNTER — Encounter: Payer: Self-pay | Admitting: Emergency Medicine

## 2012-07-19 ENCOUNTER — Encounter: Payer: Self-pay | Admitting: Cardiovascular Disease

## 2012-07-20 ENCOUNTER — Ambulatory Visit (INDEPENDENT_AMBULATORY_CARE_PROVIDER_SITE_OTHER): Payer: Medicare Other | Admitting: Cardiovascular Disease

## 2012-07-20 ENCOUNTER — Encounter: Payer: Self-pay | Admitting: Cardiovascular Disease

## 2012-07-20 VITALS — BP 128/80 | HR 56 | Ht 71.0 in | Wt 203.8 lb

## 2012-07-20 DIAGNOSIS — E785 Hyperlipidemia, unspecified: Secondary | ICD-10-CM

## 2012-07-20 DIAGNOSIS — I10 Essential (primary) hypertension: Secondary | ICD-10-CM

## 2012-07-20 DIAGNOSIS — G4733 Obstructive sleep apnea (adult) (pediatric): Secondary | ICD-10-CM

## 2012-07-20 DIAGNOSIS — I639 Cerebral infarction, unspecified: Secondary | ICD-10-CM

## 2012-07-20 DIAGNOSIS — R29818 Other symptoms and signs involving the nervous system: Secondary | ICD-10-CM

## 2012-07-20 DIAGNOSIS — I119 Hypertensive heart disease without heart failure: Secondary | ICD-10-CM

## 2012-07-20 DIAGNOSIS — I2510 Atherosclerotic heart disease of native coronary artery without angina pectoris: Secondary | ICD-10-CM

## 2012-07-20 DIAGNOSIS — R2990 Unspecified symptoms and signs involving the nervous system: Secondary | ICD-10-CM

## 2012-07-20 DIAGNOSIS — I6330 Cerebral infarction due to thrombosis of unspecified cerebral artery: Secondary | ICD-10-CM

## 2012-07-20 NOTE — Progress Notes (Signed)
Patient ID: Eugene Murray, male   DOB: [birth year], 72 y.o.   MRN: [id number]  HPI: Eugene Murray, is a 72 y.o. male presents to the office today for cardiology evaluation to I. last saw him in March 2014. He is now 72 years old and has a history of hypertension, hyperlipidemia, obstructive sleep apnea on CPAP therapy, history of prostate cancer, and established coronary artery disease. In November 2008, he underwent initial percutaneous coronary intervention to his diagonal vessel and right coronary arteries. In April 2011 he was found to have a new 95% stenosis beyond is widely patent RCA stent in the distal right coronary artery at which time a 3.5x12 mm premised DES stent was inserted. His diagonal stent was patent. He did have 20% mid LAD stenosis. In June 2013 and perfusion study was normal. Her recently, he was apparently hospitalized after he experienced symptoms of weakness involving his right arm with slowness of speech outright dysarthria. He was seen by the stroke team. A CT of his head did not show acute intracranial abnormality. MRI showed an equivocal tiny acute left pontomedullary junction stroke. He had mild atherosclerotic changes but otherwise unremarkable.  From a cardiac standpoint, he has remained relatively stable without chest pain. He denies shortness of breath. He denies tachycardia palpitations. He denies presyncope or syncope.  Past Medical History  Diagnosis Date  . Allergic rhinitis   . History of prostate cancer   . Hypertension   . OSA (obstructive sleep apnea)     cpap machine at home  . Coronary artery disease   . Hyperlipidemia   . Hypokalemia     Past Surgical History  Procedure Laterality Date  . Hernia repair      x 2   . Prostate surgery      had cancer; radical resection 1997   . Carotid stent  11-08 and 4-11    stent x 3 (12/2006 x 2; 2011 x 1 stent)    Allergies  Allergen Reactions  . Other Hives    SEAFOOD  . Penicillins Other (See  Comments)    "caused me to pass out"  . Sulfa Antibiotics Hives    Current Outpatient Prescriptions  Medication Sig Dispense Refill  . amLODipine (NORVASC) 5 MG tablet TAKE 1 TABLET EVERY DAY  30 tablet  8  . aspirin 81 MG tablet Take 81 mg by mouth daily.        Marland Kitchen atorvastatin (LIPITOR) 40 MG tablet Take 2 tablets (80 mg total) by mouth daily.  60 tablet  1  . Cimetidine (ACID REDUCER PO) Take by mouth as needed.      . clopidogrel (PLAVIX) 75 MG tablet TAKE 1 TABLET BY MOUTH EVERY DAY  30 tablet  8  . hydrochlorothiazide (HYDRODIURIL) 25 MG tablet TAKE 1 TABLET BY MOUTH DAILY  30 tablet  8  . lisinopril (PRINIVIL,ZESTRIL) 40 MG tablet TAKE 1 TABLET EVERY DAY  30 tablet  8  . metoprolol (LOPRESSOR) 50 MG tablet Take 50 mg by mouth 2 (two) times daily.       . nitroGLYCERIN (NITROSTAT) 0.4 MG SL tablet Place 0.4 mg under the tongue every 5 (five) minutes as needed.         No current facility-administered medications for this visit.    Socially he is married has 3 children and 2 grandchildren. He does drink occasional alcohol. There is no tobacco use. He does exercise occasionally.  ROV is notable as above from  a cardiac standpoint. He denies fever chills or night sweats. He does wear glasses. He denies palpitations. He denies indigestion. He denies bleeding. He denies claudication or edema. He has been followed up with the neurologist. He denies headache or visual symptoms. Other system review is negative.  PE BP 128/80  Pulse 56  Ht 5\' 11"  (1.803 m)  Wt 203 lb 12.8 oz (92.443 kg)  BMI 28.44 kg/m2  General: Alert, oriented, no distress.  HEENT: Normocephalic, atraumatic. Pupils round and reactive; sclera anicteric;  Nose with nasal septal hypertrophy Mouth/Parynx benign; Mallinpatti scale Neck: No JVD, no carotid briuts Lungs: clear to ausculatation and percussion; no wheezing or rales Heart: RRR, s1 s2 normal 1/6 systolic ejection murmur, unchanged Abdomen: soft, nontender; no  hepatosplenomehaly, BS+; abdominal aorta nontender and not dilated by palpation. Pulses 2+ Extremities: no clubbinbg cyanosis or edema, Homan's sign negative  Neurologic: grossly nonfocal   ECG sinus rhythm at 56 beats per minute. PR interval 182 ms QTc interval 391 ms  LABS:  BMET    Component Value Date/Time   NA 139 07/01/2012 0450   K 3.1* 07/01/2012 0450   CL 101 07/01/2012 0450   CO2 28 07/01/2012 0450   GLUCOSE 91 07/01/2012 0450   BUN 14 07/01/2012 0450   CREATININE 1.01 07/01/2012 0450   CALCIUM 8.5 07/01/2012 0450   GFRNONAA 72* 07/01/2012 0450   GFRAA 84* 07/01/2012 0450     Hepatic Function Panel     Component Value Date/Time   PROT 7.1 06/30/2012 1254   ALBUMIN 3.7 06/30/2012 1254   AST 19 06/30/2012 1254   ALT 14 06/30/2012 1254   ALKPHOS 69 06/30/2012 1254   BILITOT 0.5 06/30/2012 1254    CBC    Component Value Date/Time   WBC 6.1 07/01/2012 0450   RBC 4.41 07/01/2012 0450   HGB 13.0 07/01/2012 0450   HCT 36.8* 07/01/2012 0450   PLT 135* 07/01/2012 0450   MCV 83.4 07/01/2012 0450   MCH 29.5 07/01/2012 0450   MCHC 35.3 07/01/2012 0450   RDW 13.4 07/01/2012 0450   LYMPHSABS 2.1 06/30/2012 1254   MONOABS 0.4 06/30/2012 1254   EOSABS 0.1 06/30/2012 1254   BASOSABS 0.0 06/30/2012 1254     Lipid Panel     Component Value Date/Time   CHOL 113 07/01/2012 0450   TRIG 114 07/01/2012 0450   HDL 28* 07/01/2012 0450   CHOLHDL 4.0 07/01/2012 0450   VLDL 23 07/01/2012 0450   LDLCALC 62 07/01/2012 0450    RADIOLOGY: Ct Head (brain) Wo Contrast  06/30/2012   *RADIOLOGY REPORT*  Clinical Data: Acute onset right upper and lower extremity weakness.  Right-sided foot drop.  CT HEAD WITHOUT CONTRAST  Technique:  Contiguous axial images were obtained from the base of the skull through the vertex without contrast.  Comparison: None.  Findings: No evidence of acute infarct, acute hemorrhage, mass lesion, mass effect or hydrocephalus.  Minimal periventricular low attenuation.  Small retention cysts or polyps are seen in the left  maxillary sinus.  IMPRESSION:  1.  No acute findings. 2.  Minimal chronic microvascular white matter ischemic changes.   Original Report Authenticated By: Leanna Battles, M.D.   Mr Angiogram Head Wo Contrast  06/30/2012   *RADIOLOGY REPORT*  Clinical Data:  Right-sided weakness.  Hypertension.  History prostate cancer.  MRI BRAIN WITHOUT CONTRAST MRA HEAD WITHOUT CONTRAST  Technique: Multiplanar, multiecho pulse sequences of the brain and surrounding structures were obtained according to standard protocol without intravenous  contrast.  Angiographic images of the head were obtained using MRA technique without contrast.  Comparison: 06/30/2012 head CT.  No comparison brain MR.  MRI HEAD  Findings:  Question tiny acute infarct left pontomedullary junction.  Moderate white matter type changes most consistent with result of small vessel disease.  No intracranial hemorrhage.  No hydrocephalus.  No intracranial mass lesion detected on this unenhanced exam.  Major intracranial vascular structures are patent.  Upper cervical cord is of decreased caliber of questionable significance/etiology.  Cervical medullary junction, pituitary region and pineal region unremarkable.  Exophthalmos.  IMPRESSION: Question tiny acute infarct left pontomedullary junction.  Moderate white matter type changes most consistent with result of small vessel disease.  Please see above  MRA HEAD  Findings: Anterior circulation without medium or large size vessel significant stenosis or occlusion.  Middle cerebral artery mild branch vessel irregularity bilaterally.  Mild narrowing distal M1 segment left middle cerebral artery.  Ectatic vertebral arteries and basilar artery.  Nonvisualization left PICA and right AICA.  Mild branch vessel irregularity superior cerebellar artery and posterior cerebral artery bilaterally.  No aneurysm or vascular malformation noted.  IMPRESSION: Mild intracranial atherosclerotic type changes as detailed above the    Original Report Authenticated By: Lacy Duverney, M.D.   Mr Brain Wo Contrast  06/30/2012   *RADIOLOGY REPORT*  Clinical Data:  Right-sided weakness.  Hypertension.  History prostate cancer.  MRI BRAIN WITHOUT CONTRAST MRA HEAD WITHOUT CONTRAST  Technique: Multiplanar, multiecho pulse sequences of the brain and surrounding structures were obtained according to standard protocol without intravenous contrast.  Angiographic images of the head were obtained using MRA technique without contrast.  Comparison: 06/30/2012 head CT.  No comparison brain MR.  MRI HEAD  Findings:  Question tiny acute infarct left pontomedullary junction.  Moderate white matter type changes most consistent with result of small vessel disease.  No intracranial hemorrhage.  No hydrocephalus.  No intracranial mass lesion detected on this unenhanced exam.  Major intracranial vascular structures are patent.  Upper cervical cord is of decreased caliber of questionable significance/etiology.  Cervical medullary junction, pituitary region and pineal region unremarkable.  Exophthalmos.  IMPRESSION: Question tiny acute infarct left pontomedullary junction.  Moderate white matter type changes most consistent with result of small vessel disease.  Please see above  MRA HEAD  Findings: Anterior circulation without medium or large size vessel significant stenosis or occlusion.  Middle cerebral artery mild branch vessel irregularity bilaterally.  Mild narrowing distal M1 segment left middle cerebral artery.  Ectatic vertebral arteries and basilar artery.  Nonvisualization left PICA and right AICA.  Mild branch vessel irregularity superior cerebellar artery and posterior cerebral artery bilaterally.  No aneurysm or vascular malformation noted.  IMPRESSION: Mild intracranial atherosclerotic type changes as detailed above the   Original Report Authenticated By: Lacy Duverney, M.D.      ASSESSMENT AND PLAN: Mr. Scearce has recently undergone neurologic evaluation  for an episode of mild dizziness, right leg weakness to the point where he was dragging his right foot with walking and right upper community weakness. He has been followed by neurology. The cardiac standpoint, he has remained relatively stable on his current medical regimen. I did review his laboratory from the hospital. Total cholesterol was 113 triglycerides 114 LDL 62. His HDL remains low at 28. His ECG is unremarkable. His blood pressure is well controlled. He is using his CPAP 100% of the time and denies residual daytime sleepiness or breakthrough snoring. As long as he remains stable  from a cardiac standpoint, I will see him in 6 months for followup evaluation.      Stepfanie Yott A 07/20/2012 9:35 AM

## 2012-07-20 NOTE — Patient Instructions (Signed)
Your physician recommends that you schedule a follow-up appointment in: 6 months  

## 2012-07-26 ENCOUNTER — Ambulatory Visit (INDEPENDENT_AMBULATORY_CARE_PROVIDER_SITE_OTHER): Payer: Medicare Other | Admitting: Neurology

## 2012-07-26 ENCOUNTER — Encounter: Payer: Self-pay | Admitting: Neurology

## 2012-07-26 ENCOUNTER — Telehealth: Payer: Self-pay

## 2012-07-26 VITALS — BP 127/76 | HR 57 | Temp 97.6°F | Ht 70.3 in | Wt 203.0 lb

## 2012-07-26 DIAGNOSIS — R29818 Other symptoms and signs involving the nervous system: Secondary | ICD-10-CM

## 2012-07-26 DIAGNOSIS — G40802 Other epilepsy, not intractable, without status epilepticus: Secondary | ICD-10-CM

## 2012-07-26 DIAGNOSIS — G8929 Other chronic pain: Secondary | ICD-10-CM

## 2012-07-26 DIAGNOSIS — M549 Dorsalgia, unspecified: Secondary | ICD-10-CM

## 2012-07-26 DIAGNOSIS — G43809 Other migraine, not intractable, without status migrainosus: Secondary | ICD-10-CM

## 2012-07-26 DIAGNOSIS — R29898 Other symptoms and signs involving the musculoskeletal system: Secondary | ICD-10-CM

## 2012-07-26 DIAGNOSIS — R2990 Unspecified symptoms and signs involving the nervous system: Secondary | ICD-10-CM

## 2012-07-26 MED ORDER — TOPIRAMATE 50 MG PO TABS: ORAL_TABLET | ORAL | Status: DC

## 2012-07-26 MED ORDER — TOPIRAMATE 100 MG PO TABS: 50.0000 mg | ORAL_TABLET | Freq: Two times a day (BID) | ORAL | Status: DC

## 2012-07-26 NOTE — Telephone Encounter (Signed)
[facility] pharmacy called to verify the directions on Topamax.  There were 2 directions in the sig.  I looked at OV and it says: Trial of Topamax 50 mg at night for one week increase if tolerated without side effects to twice daily for migraine prevention. I have updated and resent Rx.

## 2012-07-26 NOTE — Progress Notes (Signed)
[facility] [address] [place]. [place] [address] [phone]       OFFICE CONSULT NOTE  Mr. SMILEY BIRR Date of Birth:  19-Jun-1940 Medical Record Number:  [id number]   Referring MD:  Dr Bruna Potter  Reason for Referral:  Stroke like symptoms HPI: Mr. Dubie is a 72 year old Caucasian male who been having recurrent stereotypical episodes of right-sided weakness, heaviness and a strange feeling on the roof of the mouth on the right. These have been occurring for the last several weeks  But have increased in last 1 week to 2-3 times a day   He was admitted to [facility] on 06/30/12 with similar symptoms which had been ongoing off and on for one week. He had noticed some difficulty speaking as well. The symptoms lasted 10-15 minutes with a maximum episode lasting 20 minutes. CT scan was unremarkable. An MRI scan of the brain showed a equivocal   tiny acute left pontomedullary junction infarct. MRA of the brain showed mild intracranial atherosclerotic changes. 2DEcho showed normal ejection fraction. Carotid Doppler showed no significant extracranial stenosis. Patient was brought to have a brainstem infarct due to small vessel disease and recurrent symptoms and was advised to add aspirin to his Plavix because of prior history of drug coated cardiac stents. Lipid profile was at goal. Patient states that his symptoms have persisted and despite adding aspirin and he's been having these now to 3 times a day. He does feel just symptoms are stereo typical and he gets an aura with a warning which is a strange feeling on the roof of his mouth on the inside of the right side. This is followed by a feeling of heaviness on the right side. He feels tired and needs to rest. This is followed by a mild headache which lasted for for  A few hours. He denies known history of chronic migraines but does states that a few years ago he did have couple of episodes of transient visual distortions which were  felt to be migraine gradient. He does have some intermittent headaches off and on but he feels his was required to go to the emergency room or take strong medications for them. He does not describe specific triggers for these episodes. He does have history of sleep apnea and does use a CPAP which was last titrated a year ago and he feels it seems to be working fine. He does also complain of chronic low back pain with some recent increase in pain and some intermittent right leg weakness and ankle giving out. He did have epidural steroid injections in the past but and has no history of back surgery. He is not had any recent imaging studies of the back done.king which slowness of speech but no frank dysarthria.  ROS:   14 system review of systems is positive for palpitation, snoring, dizziness, weakness, headache, not enough sleep, intermittent right leg weakness. PMH:  Past Medical History  Diagnosis Date  . Allergic rhinitis   . History of prostate cancer   . Hypertension   . OSA (obstructive sleep apnea)     cpap machine at home  . Coronary artery disease   . Hyperlipidemia   . Hypokalemia     Social History:  History   Social History  . Marital Status: Married    Spouse Name: N/A    Number of Children: N/A  . Years of Education: N/A   Occupational History  . retired    Social History  Main Topics  . Smoking status: Former Smoker -- 15 years    Types: Cigarettes    Quit date: 09/08/1985  . Smokeless tobacco: Not on file  . Alcohol Use: Yes  . Drug Use: No  . Sexually Active: Not on file   Other Topics Concern  . Not on file   Social History Narrative   3 kids    Retired Technical sales engineer    Former smoker quit 1980s. Denies etOH, other drugs     Medications:   Current Outpatient Prescriptions on File Prior to Visit  Medication Sig Dispense Refill  . amLODipine (NORVASC) 5 MG tablet TAKE 1 TABLET EVERY DAY  30 tablet  8  . aspirin 81 MG tablet Take 81 mg by mouth daily.         Marland Kitchen atorvastatin (LIPITOR) 40 MG tablet Take 2 tablets (80 mg total) by mouth daily.  60 tablet  1  . clopidogrel (PLAVIX) 75 MG tablet TAKE 1 TABLET BY MOUTH EVERY DAY  30 tablet  8  . hydrochlorothiazide (HYDRODIURIL) 25 MG tablet TAKE 1 TABLET BY MOUTH DAILY  30 tablet  8  . lisinopril (PRINIVIL,ZESTRIL) 40 MG tablet TAKE 1 TABLET EVERY DAY  30 tablet  8  . metoprolol (LOPRESSOR) 50 MG tablet Take 50 mg by mouth 2 (two) times daily.       . nitroGLYCERIN (NITROSTAT) 0.4 MG SL tablet Place 0.4 mg under the tongue every 5 (five) minutes as needed.         No current facility-administered medications on file prior to visit.    Allergies:   Allergies  Allergen Reactions  . Other Hives    SEAFOOD  . Penicillins Other (See Comments)    "caused me to pass out"  . Sulfa Antibiotics Hives    Physical Exam General: well developed, well nourished, seated, in no evident distress Head: head normocephalic and atraumatic. Orohparynx benign Neck: supple with no carotid or supraclavicular bruits Cardiovascular: regular rate and rhythm, no murmurs Musculoskeletal: no deformity. No back tenderness. Straight leg raising test is negative. Skin:  no rash/petichiae Vascular:  Normal pulses all extremities  Neurologic Exam Mental Status: Awake and fully alert. Oriented to place and time. Recent and remote memory intact. Attention span, concentration and fund of knowledge appropriate. Mood and affect appropriate.  Cranial Nerves: Fundoscopic exam reveals sharp disc margins. Pupils equal, briskly reactive to light. Extraocular movements full without nystagmus. Visual fields full to confrontation. Hearing intact. Facial sensation intact. Face, tongue, palate moves normally and symmetrically.  Motor: Normal bulk and tone. Normal strength in all tested extremity muscles. Sensory.: intact to tough and pinprick and vibratory.  Coordination: Rapid alternating movements normal in all extremities. Finger-to-nose  and heel-to-shin performed accurately bilaterally. Gait and Station: Arises from chair without difficulty. Stance is normal. Gait demonstrates normal stride length and balance . Able to heel, toe and tandem walk without difficulty.  Reflexes: 1+ and symmetric except right knee jerk and ankle jerk are absent. Toes downgoing.     ASSESSMENT: 72 year old male with recurrent episodes of transient right body heaviness, weakness as well as funny sensation in the mouth followed by mild headache possible complicated migraines. Doubt TIAs given the stereo typical nature of the symptoms. Recent history of small brain stem infarct 3 weeks ago from small vessel disease with vascular risk factors of sleep apnea, hypertension, hyperlipidemia and coronary artery disease. Chronic low back pain with some recent worsening and intermittent right leg weakness raising concern for degenerative  lumbar disc and spine disease    PLAN: Continue aspirin and Plavix for stroke prevention given history of drug coated stents. Strict control of hypertension with blood pressure goal below 130/90 and lipids with LDL cholesterol goal below 100 mg percent. Trial of Topamax 50 mg at night for one week increase if tolerated to 50 mg twice daily for migraine prevention. Check EEG for seizure activity. Check MRI of the lumbar spine given history of long-standing back pain and right leg weakness. I have given him back stretching exercises. Return for followup in 2 months or call earlier if necessary

## 2012-07-26 NOTE — Patient Instructions (Addendum)
Patient advised to stay on aspirin and Plavix for stroke prevention as well as strict control of hypertension with blood pressure goal below 130/90 and lipids the LDL cholesterol goal below 100 mg percent. Trial of Topamax 50 mg at night for one week increase if tolerated without side effects 2 twice daily for migraine prevention. Check EEG for seizures an MRI scan of the lumbar spine for back pain. Return for followup in 2 months or call earlier if necessary.

## 2012-07-29 ENCOUNTER — Encounter: Payer: Self-pay | Admitting: Cardiovascular Disease

## 2012-08-02 ENCOUNTER — Ambulatory Visit
Admission: RE | Admit: 2012-08-02 | Discharge: 2012-08-02 | Disposition: A | Discharge status: Home / Self Care | Payer: Medicare Other | Source: Ambulatory Visit | Attending: Neurology | Admitting: Neurology

## 2012-08-02 ENCOUNTER — Other Ambulatory Visit (INDEPENDENT_AMBULATORY_CARE_PROVIDER_SITE_OTHER): Payer: Medicare Other

## 2012-08-02 DIAGNOSIS — M549 Dorsalgia, unspecified: Secondary | ICD-10-CM

## 2012-08-02 DIAGNOSIS — G40802 Other epilepsy, not intractable, without status epilepticus: Secondary | ICD-10-CM

## 2012-08-02 DIAGNOSIS — M546 Pain in thoracic spine: Secondary | ICD-10-CM

## 2012-08-03 ENCOUNTER — Other Ambulatory Visit: Payer: Self-pay | Admitting: Neurology

## 2012-08-03 DIAGNOSIS — G40802 Other epilepsy, not intractable, without status epilepticus: Secondary | ICD-10-CM

## 2012-08-06 ENCOUNTER — Other Ambulatory Visit: Payer: Self-pay | Admitting: Cardiovascular Disease

## 2012-08-09 ENCOUNTER — Telehealth: Payer: Self-pay | Admitting: *Deleted

## 2012-08-09 NOTE — Telephone Encounter (Signed)
Spoke to patient. Requesting MRI results.

## 2012-08-13 NOTE — Telephone Encounter (Signed)
MRI results in chart. Sandy please communicate to patient

## 2012-08-15 NOTE — Telephone Encounter (Signed)
LMVM for pt that called with MRI results.

## 2012-08-17 NOTE — Telephone Encounter (Signed)
I called and spoke to pt and gave him the results of MRI, and EEG.  He verbalized understanding.   He states has noticed topamax 50mg  po qday made him groggy, but this has passed somewhat, and sx are less.  He states he wants to stay at 50mg  po qhs, but has the option to go up to bid if wants.

## 2012-08-17 NOTE — Telephone Encounter (Signed)
Pt seen 07-26-12

## 2012-09-04 NOTE — Progress Notes (Signed)
PT evaluation addendum Late entry for Jul 11, 2012   07/11/12 0847  PT G-Codes **NOT FOR INPATIENT CLASS**  Functional Assessment Tool Used Dynamic Gait Index  Functional Limitation Mobility: Walking and moving around  Mobility: Walking and Moving Around Current Status [phone]) CH  Mobility: Walking and Moving Around Goal Status [phone]) CH  Mobility: Walking and Moving Around Discharge Status [phone]) [facility]  PT General Charges  $$ ACUTE PT VISIT 1 Procedure  PT Evaluation  $Initial PT Evaluation Tier I 1 Procedure  PT Treatments  $Therapeutic Exercise 8-22 mins  09/04/2012 Corlis Hove, PT [phone]

## 2012-09-05 ENCOUNTER — Ambulatory Visit: Payer: Self-pay | Admitting: Neurology

## 2012-09-07 ENCOUNTER — Ambulatory Visit: Payer: Medicare Other | Admitting: Internal Medicine

## 2012-10-04 ENCOUNTER — Other Ambulatory Visit: Payer: Self-pay

## 2012-10-12 ENCOUNTER — Ambulatory Visit: Payer: Medicare Other | Admitting: Internal Medicine

## 2012-10-17 ENCOUNTER — Encounter: Payer: Self-pay | Admitting: Internal Medicine

## 2012-11-12 ENCOUNTER — Other Ambulatory Visit (HOSPITAL_COMMUNITY): Payer: Self-pay | Admitting: Internal Medicine

## 2012-11-14 ENCOUNTER — Ambulatory Visit: Payer: Self-pay | Admitting: Neurology

## 2012-11-14 ENCOUNTER — Telehealth: Payer: Self-pay | Admitting: Nurse Practitioner

## 2012-11-14 NOTE — Telephone Encounter (Signed)
Call pt no answer, left message. °

## 2012-11-15 ENCOUNTER — Ambulatory Visit (INDEPENDENT_AMBULATORY_CARE_PROVIDER_SITE_OTHER): Payer: Medicare Other | Admitting: Nurse Practitioner

## 2012-11-15 ENCOUNTER — Encounter: Payer: Self-pay | Admitting: Nurse Practitioner

## 2012-11-15 VITALS — BP 108/72 | HR 52 | Temp 98.2°F | Ht 70.0 in | Wt 196.0 lb

## 2012-11-15 DIAGNOSIS — R2990 Unspecified symptoms and signs involving the nervous system: Secondary | ICD-10-CM

## 2012-11-15 DIAGNOSIS — M48061 Spinal stenosis, lumbar region without neurogenic claudication: Secondary | ICD-10-CM

## 2012-11-15 DIAGNOSIS — R29818 Other symptoms and signs involving the nervous system: Secondary | ICD-10-CM

## 2012-11-15 DIAGNOSIS — M549 Dorsalgia, unspecified: Secondary | ICD-10-CM

## 2012-11-15 DIAGNOSIS — G8929 Other chronic pain: Secondary | ICD-10-CM

## 2012-11-15 DIAGNOSIS — G43809 Other migraine, not intractable, without status migrainosus: Secondary | ICD-10-CM

## 2012-11-15 NOTE — Patient Instructions (Signed)
Continue aspirin and Plavix for stroke prevention given history of drug coated stents. Strict control of hypertension with blood pressure goal below 130/90 and lipids with LDL cholesterol goal below 100 mg percent.   Continue Topamax 50 mg at night for migraine prevention.   Return for followup in 6 months or call earlier if necessary.

## 2012-11-15 NOTE — Progress Notes (Signed)
[facility]  PATIENT: Eugene Murray DOB: [birth year]   HISTORY FROM: patient, chart REASON FOR VISIT: routine follow up  HISTORY OF PRESENT ILLNESS:  Eugene Murray is a 72 year old AA male who been having recurrent stereotypical episodes of right-sided weakness, heaviness and a strange feeling on the roof of the mouth on the right. These have been occurring for the last several weeks But have increased in last 1 week to 2-3 times a day He was admitted to [facility] on 06/30/12 with similar symptoms which had been ongoing off and on for one week. He had noticed some difficulty speaking as well. The symptoms lasted 10-15 minutes with a maximum episode lasting 20 minutes. CT scan was unremarkable. An MRI scan of the brain showed a equivocal tiny acute left pontomedullary junction infarct. MRA of the brain showed mild intracranial atherosclerotic changes. 2DEcho showed normal ejection fraction. Carotid Doppler showed no significant extracranial stenosis. Patient was brought to have a brainstem infarct due to small vessel disease and recurrent symptoms and was advised to add aspirin to his Plavix because of prior history of drug coated cardiac stents. Lipid profile was at goal. Patient states that his symptoms have persisted and despite adding aspirin and he's been having these now to 3 times a day. He does feel just symptoms are stereo typical and he gets an aura with a warning which is a strange feeling on the roof of his mouth on the inside of the right side. This is followed by a feeling of heaviness on the right side. He feels tired and needs to rest. This is followed by a mild headache which lasted for for A few hours. He denies known history of chronic migraines but does states that a few years ago he did have couple of episodes of transient visual distortions which were felt to be migraine variant. He does have some intermittent headaches off and on but he didn't feel he needed to  go to the emergency room or take strong medications for them. He does not describe specific triggers for these episodes. He does have history of sleep apnea and does use a CPAP which was last titrated a year ago and he feels it seems to be working fine. He does also complain of chronic low back pain with some recent increase in pain and some intermittent right leg weakness and ankle giving out. He did have epidural steroid injections in the past but and has no history of back surgery. He is not had any recent imaging studies of the back done.   UPDATE 11/15/12 (LL): Eugene Murray returns for follow up of TIA vs. Migraine variant symptoms and right leg weakness.  After starting Topamax 50 mg at night, he noticed the right arm, right leg weakness, and the tingling sensation in the roof of his mouth disappeared after about 1 week on the medication.  He tried going to twice a day but felt worse cognitively.  He went back to once daily at night and is pleased.  EEG was normal.  MRI of the lumbar spine showed mild degenerative changes and mild central cord stenosis.  He is taking Plavix and aspirin daily with no problems.  Patient denies medication side effects, with no signs of bleeding or excessive bruising.    ROS:  14 system review of systems is positive for nothing.  ALLERGIES: Allergies  Allergen Reactions  . Other Hives    SEAFOOD  . Penicillins Other (See Comments)    "  caused me to pass out"  . Sulfa Antibiotics Hives    HOME MEDICATIONS: Outpatient Prescriptions Prior to Visit  Medication Sig Dispense Refill  . amLODipine (NORVASC) 5 MG tablet TAKE 1 TABLET EVERY DAY  30 tablet  8  . aspirin 81 MG tablet Take 81 mg by mouth daily.        Marland Kitchen atorvastatin (LIPITOR) 40 MG tablet Take 2 tablets (80 mg total) by mouth daily.  60 tablet  1  . clopidogrel (PLAVIX) 75 MG tablet TAKE 1 TABLET BY MOUTH EVERY DAY  30 tablet  8  . hydrochlorothiazide (HYDRODIURIL) 25 MG tablet TAKE 1 TABLET BY MOUTH DAILY   30 tablet  8  . lisinopril (PRINIVIL,ZESTRIL) 40 MG tablet TAKE 1 TABLET EVERY DAY  30 tablet  8  . metoprolol (LOPRESSOR) 50 MG tablet TAKE 1 & 1/2 TABLETS EVERY MORNING & 1 TABLET IN THE EVENINIG  75 tablet  3  . nitroGLYCERIN (NITROSTAT) 0.4 MG SL tablet Place 0.4 mg under the tongue every 5 (five) minutes as needed.        . topiramate (TOPAMAX) 50 MG tablet Take one tablet at night x 1 week and then one twice daily  60 tablet  3   No facility-administered medications prior to visit.    PAST MEDICAL HISTORY: Past Medical History  Diagnosis Date  . Allergic rhinitis   . History of prostate cancer   . Hypertension   . OSA (obstructive sleep apnea)     cpap machine at home  . Coronary artery disease   . Hyperlipidemia   . Hypokalemia     PAST SURGICAL HISTORY: Past Surgical History  Procedure Laterality Date  . Hernia repair      x 2   . Prostate surgery      had cancer; radical resection 1997   . Carotid stent  11-08 and 4-11    stent x 3 (12/2006 x 2; 2011 x 1 stent)    FAMILY HISTORY: Family History  Problem Relation Age of Onset  . Asthma Sister   . Colon cancer Father     possibly age 2 y.o diagnosed   . Stroke Mother     SOCIAL HISTORY: History   Social History  . Marital Status: Married    Spouse Name: N/A    Number of Children: N/A  . Years of Education: N/A   Occupational History  . retired    Social History Main Topics  . Smoking status: Former Smoker -- 15 years    Types: Cigarettes    Quit date: 09/08/1985  . Smokeless tobacco: Not on file  . Alcohol Use: Yes  . Drug Use: No  . Sexual Activity: Not on file   Other Topics Concern  . Not on file   Social History Narrative   3 kids    Retired Technical sales engineer    Former smoker quit 1980s. Denies etOH, other drugs      PHYSICAL EXAM  There were no vitals filed for this visit. There is no weight on file to calculate BMI.  Physical Exam  General: well developed, well nourished, seated, in  no evident distress  Head: head normocephalic and atraumatic. Orohparynx benign  Neck: supple with no carotid or supraclavicular bruits  Cardiovascular: regular rate and rhythm, no murmurs  Musculoskeletal: no deformity. No back tenderness. Straight leg raising test is negative.  Skin: no rash/petichiae  Vascular: Normal pulses all extremities  Neurologic Exam  Mental Status: Awake and fully alert.  Oriented to place and time. Recent and remote memory intact. Attention span, concentration and fund of knowledge appropriate. Mood and affect appropriate.  Cranial Nerves: Fundoscopic exam reveals sharp disc margins. Pupils equal, briskly reactive to light. Extraocular movements full without nystagmus. Visual fields full to confrontation. Hearing intact. Facial sensation intact. Face, tongue, palate moves normally and symmetrically.  Motor: Normal bulk and tone. Normal strength in all tested extremity muscles.  Sensory.: intact to tough and pinprick and vibratory.  Coordination: Rapid alternating movements normal in all extremities. Finger-to-nose and heel-to-shin performed accurately bilaterally.  Gait and Station: Arises from chair without difficulty. Stance is normal. Gait demonstrates normal stride length and balance . Able to heel, toe and tandem walk without difficulty.  Reflexes: 1+ and symmetric except right knee jerk and ankle jerk are absent. Toes downgoing.    DIAGNOSTIC DATA (LABS, IMAGING, TESTING) - I reviewed patient records, labs, notes, testing and imaging myself where available.  Lab Results  Component Value Date   WBC 6.1 07/01/2012   HGB 13.0 07/01/2012   HCT 36.8* 07/01/2012   MCV 83.4 07/01/2012   PLT 135* 07/01/2012      Component Value Date/Time   NA 139 07/01/2012 0450   K 3.1* 07/01/2012 0450   CL 101 07/01/2012 0450   CO2 28 07/01/2012 0450   GLUCOSE 91 07/01/2012 0450   BUN 14 07/01/2012 0450   CREATININE 1.01 07/01/2012 0450   CALCIUM 8.5 07/01/2012 0450   PROT 7.1 06/30/2012 1254    ALBUMIN 3.7 06/30/2012 1254   AST 19 06/30/2012 1254   ALT 14 06/30/2012 1254   ALKPHOS 69 06/30/2012 1254   BILITOT 0.5 06/30/2012 1254   GFRNONAA 72* 07/01/2012 0450   GFRAA 84* 07/01/2012 0450   Lab Results  Component Value Date   CHOL 113 07/01/2012   HDL 28* 07/01/2012   LDLCALC 62 07/01/2012   TRIG 114 07/01/2012   CHOLHDL 4.0 07/01/2012   Lab Results  Component Value Date   HGBA1C 5.4 07/01/2012   No results found for this basename: VITAMINB12   Lab Results  Component Value Date   TSH 0.583  12/31/2006   08/04/12 MRI LUMBAR SPINE Wo Abnormal MRI scan of the lumbar spine showing mild spondylytic and moderate facet degenerative changes throughout resulting in moderate bilateral foramina narrowing at L3-4 and L4-5 along with mild central canal stenosis.  08/03/12 EEG Normal electroencephalogram, awake, asleep and with activation procedures. There are no focal lateralizing or epileptiform features.  ASSESSMENT AND PLAN 72 year old male with recurrent episodes of transient right body heaviness, weakness as well as funny sensation in the mouth followed by mild headache possible complicated migraines. Doubt TIAs given the stereotypical nature of the symptoms. Recent history of small brainstem infarct 3 weeks ago from small vessel disease with vascular risk factors of sleep apnea, hypertension, hyperlipidemia and coronary artery disease.  Chronic low back pain with mild degenerative lumbar disc and spine disease.   PLAN:  Continue aspirin and Plavix for stroke prevention given history of drug coated stents. Strict control of hypertension with blood pressure goal below 130/90 and lipids with LDL cholesterol goal below 100 mg percent.   Continue Topamax 50 mg daily for migraine prevention.  Return for followup in 6 months or call earlier if necessary.   Eugene Reen NP-C 11/15/2012, 2:24 PM [facility] [address] [place], [place] [address] [phone]

## 2012-11-21 ENCOUNTER — Telehealth: Payer: Self-pay | Admitting: Cardiovascular Disease

## 2012-11-21 NOTE — Telephone Encounter (Signed)
Pt needs a refill on his Lipitor, he stated that the pharmacy has sent in a request

## 2012-11-22 MED ORDER — ATORVASTATIN CALCIUM 40 MG PO TABS: 40.0000 mg | ORAL_TABLET | Freq: Every day | ORAL | Status: DC

## 2012-11-22 NOTE — Telephone Encounter (Signed)
No refill request received.  Call to pt and informed.  Pt stated he uses [facility] [address]  Pt also stated he has been taking atorvastatin 40 mg daily.  Records indicate 80 mg.  Refill(s) sent to pharmacy for 40 mg daily.  Pt verbalized understanding and agreed w/ plan.

## 2012-12-05 ENCOUNTER — Ambulatory Visit (INDEPENDENT_AMBULATORY_CARE_PROVIDER_SITE_OTHER): Payer: Medicare Other | Admitting: Internal Medicine

## 2012-12-05 ENCOUNTER — Encounter: Payer: Self-pay | Admitting: Internal Medicine

## 2012-12-05 VITALS — BP 128/76 | HR 57 | Ht 70.75 in | Wt 197.8 lb

## 2012-12-05 DIAGNOSIS — Z23 Encounter for immunization: Secondary | ICD-10-CM

## 2012-12-05 DIAGNOSIS — G4733 Obstructive sleep apnea (adult) (pediatric): Secondary | ICD-10-CM

## 2012-12-05 NOTE — Progress Notes (Signed)
Subjective:    Patient ID: Eugene Murray, male    DOB: 11-Oct-1940, 72 y.o.   MRN: [id number]  HPI 09/09/10- 53 yoM former smoker, coming to re-establish for OSA. Last here 05/17/06- note reviewed. NPSG 01/01/05- AHI 42.9/hr. Since then he has continued using CPAP 10 all night, every night ([facility]). Current machine works ok . He has a new full face mask. He feels he sleeps well, with little waking at night, no snore through. He and his wife are concerned that he still seems to fall asleep easily in the day time, especially if he sits. Stops to nap at rest stops on long drives. Seldom caffeine. He dozes off and on and stays up late to channel surf, sometimes 2AM, then up at 6 AM.  Hx CAD w/ stents- Dr Tresa Endo.   09/09/11- 40 yoM former smoker followed for OSA, complicated by hx HBP, Allergic rhinitis Wears CPAP 10 every night for approximately 6 hours and still gets sleepy during the day-can fall asleep if sitting down. Sleepy only if sitting quietly, and he doesn't mind. Denies snoring through his mask. Admits he often will stay up until 2:00 watching TV and recognizes that his part why he is sleepy. We discussed sleep hygiene and CPAP.  12/05/12- 72 yoM former smoker followed for OSA, complicated by hx HBP, Allergic rhinitis FOLLOWS FOR: Wears CPAP 10/ [facility] every night for about 6 hours; pressure okay for patient.  Wakes in the morning with left temple headache, possibly from strap. This clears quickly. Hospitalized with TIA May 7, history of migraine. Still some daytime sleepiness watching TV. Little caffeine  ROS-see HPI Constitutional:   No-   weight loss, night sweats, fevers, chills,+fatigue, lassitude. HEENT:   + headaches, no-difficulty swallowing, tooth/dental problems, sore throat,       No-  sneezing, itching, ear ache, nasal congestion, post nasal drip,  CV:  No-   chest pain, orthopnea, PND, swelling in lower extremities, anasarca, dizziness, palpitations Resp: No-   shortness of breath  with exertion or at rest.              No-   productive cough,  No non-productive cough,  No- coughing up of blood.              No-   change in color of mucus.  No- wheezing.   Skin: No-   rash or lesions. GI:  No-   heartburn, indigestion, abdominal pain, nausea, vomiting,  GU:  MS:  No-   joint pain or swelling.   Neuro-     nothing unusual Psych:  No- change in mood or affect. No depression or anxiety.  No memory loss.  Objective:   Physical Exam General- Alert, Oriented, Affect-appropriate, Distress- none acute,  Medium build, laconic affect Skin- rash-none, lesions- none, excoriation- none Lymphadenopathy- none Head- atraumatic            Eyes- Gross vision intact, PERRLA, conjunctivae clear secretions            Ears- Hearing, canals            Nose- Clear, Septal dev, mucus, polyps, erosion, perforation             Throat- Mallampati III , mucosa clear , drainage- none, tonsils- atrophic Neck- flexible , trachea midline, no stridor , thyroid nl, carotid no bruit Chest - symmetrical excursion , unlabored           Heart/CV- RRR , no murmur , no gallop  ,  no rub, nl s1 s2                           - JVD- none , edema- none, stasis changes- none, varices- none           Lung- clear to P&A, wheeze- none, cough- none , dullness-none, rub- none           Chest wall-  Abd-  Br/ Gen/ Rectal- Not done, not indicated Extrem- cyanosis- none, clubbing, none, atrophy- none, strength- nl Neuro- grossly intact to observation  Assessment & Plan:

## 2012-12-05 NOTE — Patient Instructions (Addendum)
Sample Nuvigil 150 mg- try 1/2 or 1 daily to try for alertness if needed  We can continue CPAP 14/ [facility]  Flu vax  Please call as needed

## 2012-12-20 NOTE — Assessment & Plan Note (Addendum)
Good compliance and control CPAP 14/[facility] Residual daytime sleepiness. Plan-educated on good sleep hygiene. Explore the sleepiness issue with sample Nuvigil 150 mg

## 2012-12-28 ENCOUNTER — Telehealth: Payer: Self-pay | Admitting: Cardiovascular Disease

## 2012-12-28 NOTE — Telephone Encounter (Signed)
Message forwarded to Dr. Kelly/Wanda, CMA.  

## 2012-12-28 NOTE — Telephone Encounter (Signed)
Is schedule to have a colonoscopy on 03/16/13, and he is on nplavix and aspirin and wants to know if its ok for him to stop that 5 days before.. Please Call    Thanks

## 2013-01-01 NOTE — Telephone Encounter (Signed)
Left message to send a clearance request and I will have Dr. Tresa Endo to sign it and return it to her.

## 2013-01-19 ENCOUNTER — Ambulatory Visit (INDEPENDENT_AMBULATORY_CARE_PROVIDER_SITE_OTHER): Payer: Medicare Other | Admitting: Cardiovascular Disease

## 2013-01-19 ENCOUNTER — Encounter: Payer: Self-pay | Admitting: Cardiovascular Disease

## 2013-01-19 VITALS — BP 140/80 | HR 57 | Ht 70.0 in | Wt 202.0 lb

## 2013-01-19 DIAGNOSIS — I10 Essential (primary) hypertension: Secondary | ICD-10-CM

## 2013-01-19 DIAGNOSIS — I498 Other specified cardiac arrhythmias: Secondary | ICD-10-CM

## 2013-01-19 DIAGNOSIS — E785 Hyperlipidemia, unspecified: Secondary | ICD-10-CM

## 2013-01-19 DIAGNOSIS — R001 Bradycardia, unspecified: Secondary | ICD-10-CM

## 2013-01-19 DIAGNOSIS — I2510 Atherosclerotic heart disease of native coronary artery without angina pectoris: Secondary | ICD-10-CM

## 2013-01-19 NOTE — Progress Notes (Signed)
Patient ID: Eugene Murray, male   DOB: 1940/03/07, 72 y.o.   MRN: [id number]      HPI: Eugene Murray, is a 72 y.o. male presents to the office today for cardiology evaluation  Eugene Murray is a 72 year old African American male who has a history of hypertension, hyperlipidemia, obstructive sleep apnea on CPAP therapy, history of prostate cancer, and established coronary artery disease. In November 2008, he underwent initial percutaneous coronary intervention to his diagonal vessel and right coronary arteries. In April 2011 he was found to have a new 95% stenosis beyond is widely patent RCA stent in the distal right coronary artery at which time a 3.5x12 mm premised DES stent was inserted. His diagonal stent was patent. He did have 20% mid LAD stenosis. In June 2013 and perfusion study was normal. Earlier this year he was after he experienced symptoms of weakness involving his right arm with slowness of speech and dysarthria. He was seen by the stroke team. A CT of his head did not show acute intracranial abnormality. MRI showed an equivocal tiny acute left pontomedullary junction stroke. He had mild atherosclerotic changes but otherwise unremarkable.  Recently, he had seen a neurologist and was started on topiramate. He has been stable neurologically.  From a cardiac standpoint, he has remained relatively stable without chest pain. He denies shortness of breath. He denies tachycardia palpitations. He denies presyncope or syncope. He does have hyperlipidemia and laboratory in May showed an LDL cholesterol of 62 with total cholesterol 113 on his current dose of atorvastatin 40 mg. He has been on aspirin and Plavix chronically. With reference to his blood pressure he has been on lisinopril 40 mg and metoprolol 50 mg twice a day as well as amlodipine 5 mg. He presents for evaluation.  Of note, the patient is tentatively scheduled to undergo colonoscopy by Dr. Loreta Ave in January.  Past Medical History  Diagnosis  Date  . Allergic rhinitis   . History of prostate cancer   . Hypertension   . OSA (obstructive sleep apnea)     cpap machine at home  . Coronary artery disease   . Hyperlipidemia   . Hypokalemia     Past Surgical History  Procedure Laterality Date  . Hernia repair      x 2   . Prostate surgery      had cancer; radical resection 1997   . Carotid stent  11-08 and 4-11    stent x 3 (12/2006 x 2; 2011 x 1 stent)    Allergies  Allergen Reactions  . Other Hives    SEAFOOD  . Penicillins Other (See Comments)    "caused me to pass out"  . Sulfa Antibiotics Hives    Current Outpatient Prescriptions  Medication Sig Dispense Refill  . amLODipine (NORVASC) 5 MG tablet TAKE 1 TABLET EVERY DAY  30 tablet  8  . Armodafinil (NUVIGIL) 150 MG tablet Take 150 mg by mouth as needed.      Marland Kitchen aspirin 81 MG tablet Take 81 mg by mouth daily.        Marland Kitchen atorvastatin (LIPITOR) 40 MG tablet Take 1 tablet (40 mg total) by mouth daily.  30 tablet  2  . clopidogrel (PLAVIX) 75 MG tablet TAKE 1 TABLET BY MOUTH EVERY DAY  30 tablet  8  . hydrochlorothiazide (HYDRODIURIL) 25 MG tablet TAKE 1 TABLET BY MOUTH DAILY  30 tablet  8  . lisinopril (PRINIVIL,ZESTRIL) 40 MG tablet TAKE 1 TABLET EVERY DAY  30 tablet  8  . metoprolol (LOPRESSOR) 50 MG tablet Take 50 mg by mouth 2 (two) times daily.       . nitroGLYCERIN (NITROSTAT) 0.4 MG SL tablet Place 0.4 mg under the tongue every 5 (five) minutes as needed.        . topiramate (TOPAMAX) 50 MG tablet Take 50 mg by mouth daily.       No current facility-administered medications for this visit.    Socially he is married has 3 children and 2 grandchildren. He does drink occasional alcohol. There is no tobacco use. He does exercise occasionally.  ROV is notable as above from a cardiac standpoint. He denies fever chills or night sweats. He denies skin lesions or rash.  He does wear glasses. He denies palpitations. There is no chest pain. There is no cough or sputum  production. He denies GERD symptoms He denies indigestion. He denies bleeding. He denies claudication or edema. He has been followed up with the neurologist. He denies headache or visual symptoms. He denies seizures. There is no diabetes. He denies whole cold or heat intolerance. She does note some fatigue while driving. He recently was given a prescription for Nuvigil but has not taken this. Other comprehensive point system review is negative.  PE BP 140/80  Pulse 57  Ht 5\' 10"  (1.778 m)  Wt 202 lb (91.627 kg)  BMI 28.98 kg/m2  General: Alert, oriented, no distress.  HEENT: Normocephalic, atraumatic. Pupils round and reactive; sclera anicteric;  Nose with nasal septal hypertrophy Mouth/Parynx benign; Mallinpatti scale 3 Neck: No JVD, no carotid briuts Lungs: clear to ausculatation and percussion; no wheezing or rales Heart: RRR, s1 s2 normal 1/6 systolic ejection murmur, unchanged Abdomen: soft, nontender; no hepatosplenomehaly, BS+; abdominal aorta nontender and not dilated by palpation. Pulses 2+ Extremities: no clubbinbg cyanosis or edema, Homan's sign negative  Neurologic: grossly nonfocal Ecological: Normal affect and mood   ECG sinus rhythm at 57 beats per minute. PR interval 190 ms QTc interval 377 ms  LABS:  BMET    Component Value Date/Time   NA 139 07/01/2012 0450   K 3.1* 07/01/2012 0450   CL 101 07/01/2012 0450   CO2 28 07/01/2012 0450   GLUCOSE 91 07/01/2012 0450   BUN 14 07/01/2012 0450   CREATININE 1.01 07/01/2012 0450   CALCIUM 8.5 07/01/2012 0450   GFRNONAA 72* 07/01/2012 0450   GFRAA 84* 07/01/2012 0450     Hepatic Function Panel     Component Value Date/Time   PROT 7.1 06/30/2012 1254   ALBUMIN 3.7 06/30/2012 1254   AST 19 06/30/2012 1254   ALT 14 06/30/2012 1254   ALKPHOS 69 06/30/2012 1254   BILITOT 0.5 06/30/2012 1254    CBC    Component Value Date/Time   WBC 6.1 07/01/2012 0450   RBC 4.41 07/01/2012 0450   HGB 13.0 07/01/2012 0450   HCT 36.8* 07/01/2012 0450   PLT 135*  07/01/2012 0450   MCV 83.4 07/01/2012 0450   MCH 29.5 07/01/2012 0450   MCHC 35.3 07/01/2012 0450   RDW 13.4 07/01/2012 0450   LYMPHSABS 2.1 06/30/2012 1254   MONOABS 0.4 06/30/2012 1254   EOSABS 0.1 06/30/2012 1254   BASOSABS 0.0 06/30/2012 1254     Lipid Panel     Component Value Date/Time   CHOL 113 07/01/2012 0450   TRIG 114 07/01/2012 0450   HDL 28* 07/01/2012 0450   CHOLHDL 4.0 07/01/2012 0450   VLDL 23 07/01/2012 0450  LDLCALC 62 07/01/2012 0450    RADIOLOGY: Ct Head (brain) Wo Contrast  06/30/2012   *RADIOLOGY REPORT*  Clinical Data: Acute onset right upper and lower extremity weakness.  Right-sided foot drop.  CT HEAD WITHOUT CONTRAST  Technique:  Contiguous axial images were obtained from the base of the skull through the vertex without contrast.  Comparison: None.  Findings: No evidence of acute infarct, acute hemorrhage, mass lesion, mass effect or hydrocephalus.  Minimal periventricular low attenuation.  Small retention cysts or polyps are seen in the left maxillary sinus.  IMPRESSION:  1.  No acute findings. 2.  Minimal chronic microvascular white matter ischemic changes.   Original Report Authenticated By: Leanna Battles, M.D.   Mr Angiogram Head Wo Contrast  06/30/2012   *RADIOLOGY REPORT*  Clinical Data:  Right-sided weakness.  Hypertension.  History prostate cancer.  MRI BRAIN WITHOUT CONTRAST MRA HEAD WITHOUT CONTRAST  Technique: Multiplanar, multiecho pulse sequences of the brain and surrounding structures were obtained according to standard protocol without intravenous contrast.  Angiographic images of the head were obtained using MRA technique without contrast.  Comparison: 06/30/2012 head CT.  No comparison brain MR.  MRI HEAD  Findings:  Question tiny acute infarct left pontomedullary junction.  Moderate white matter type changes most consistent with result of small vessel disease.  No intracranial hemorrhage.  No hydrocephalus.  No intracranial mass lesion detected on this unenhanced exam.   Major intracranial vascular structures are patent.  Upper cervical cord is of decreased caliber of questionable significance/etiology.  Cervical medullary junction, pituitary region and pineal region unremarkable.  Exophthalmos.  IMPRESSION: Question tiny acute infarct left pontomedullary junction.  Moderate white matter type changes most consistent with result of small vessel disease.  Please see above  MRA HEAD  Findings: Anterior circulation without medium or large size vessel significant stenosis or occlusion.  Middle cerebral artery mild branch vessel irregularity bilaterally.  Mild narrowing distal M1 segment left middle cerebral artery.  Ectatic vertebral arteries and basilar artery.  Nonvisualization left PICA and right AICA.  Mild branch vessel irregularity superior cerebellar artery and posterior cerebral artery bilaterally.  No aneurysm or vascular malformation noted.  IMPRESSION: Mild intracranial atherosclerotic type changes as detailed above the   Original Report Authenticated By: Lacy Duverney, M.D.   Mr Brain Wo Contrast  06/30/2012   *RADIOLOGY REPORT*  Clinical Data:  Right-sided weakness.  Hypertension.  History prostate cancer.  MRI BRAIN WITHOUT CONTRAST MRA HEAD WITHOUT CONTRAST  Technique: Multiplanar, multiecho pulse sequences of the brain and surrounding structures were obtained according to standard protocol without intravenous contrast.  Angiographic images of the head were obtained using MRA technique without contrast.  Comparison: 06/30/2012 head CT.  No comparison brain MR.  MRI HEAD  Findings:  Question tiny acute infarct left pontomedullary junction.  Moderate white matter type changes most consistent with result of small vessel disease.  No intracranial hemorrhage.  No hydrocephalus.  No intracranial mass lesion detected on this unenhanced exam.  Major intracranial vascular structures are patent.  Upper cervical cord is of decreased caliber of questionable significance/etiology.   Cervical medullary junction, pituitary region and pineal region unremarkable.  Exophthalmos.  IMPRESSION: Question tiny acute infarct left pontomedullary junction.  Moderate white matter type changes most consistent with result of small vessel disease.  Please see above  MRA HEAD  Findings: Anterior circulation without medium or large size vessel significant stenosis or occlusion.  Middle cerebral artery mild branch vessel irregularity bilaterally.  Mild narrowing distal M1  segment left middle cerebral artery.  Ectatic vertebral arteries and basilar artery.  Nonvisualization left PICA and right AICA.  Mild branch vessel irregularity superior cerebellar artery and posterior cerebral artery bilaterally.  No aneurysm or vascular malformation noted.  IMPRESSION: Mild intracranial atherosclerotic type changes as detailed above the   Original Report Authenticated By: Lacy Duverney, M.D.      ASSESSMENT AND PLAN: From a cardiac standpoint, Mr. Jaquon Gingerich continues to do well. He had initially undergone stenting to his diagonal vessel and right coronary artery in November 2008. In April 2011 he was found to have a new 95% stenosis beyond his widely patent RCA stent in the distal RCA which time a 3.5x12 mm posterior stent was inserted. His other stents were patent. Mild 20% LAD stenoses. He remains asymptomatic. He's not having chest pain. Is not having anginal symptoms. Blood pressure today is well controlled. Recent lipid studies are excellent on current therapy. He's not having any seizure activity. Neurologically he seems to have improved and normalized. He should be on dual antiplatelet therapy indefinitely based on current DAPT results. He is scheduled to undergo colonoscopy on 03/16/2013. He will hold his aspirin and Plavix for 5 days prior to the procedure. I will see him in 6 months for cardiology reassessment.    Cierra Rothgeb A 01/19/2013 10:21 AM

## 2013-01-19 NOTE — Patient Instructions (Addendum)
Your physician recommends that you schedule a follow-up appointment in: 6 MONTHS. No changes were made in your therapy today.  Dr. Tresa Endo has cleared you to have your colonoscopy. It is okay to hold your ASA and plavix ( clopidigrel) 5 days before the procedure.

## 2013-01-22 ENCOUNTER — Encounter: Payer: Self-pay | Admitting: Cardiovascular Disease

## 2013-02-02 ENCOUNTER — Other Ambulatory Visit: Payer: Self-pay | Admitting: Cardiovascular Disease

## 2013-02-02 NOTE — Telephone Encounter (Signed)
Rx was sent to pharmacy electronically. 

## 2013-03-01 HISTORY — PX: COLONOSCOPY: SHX174

## 2013-03-09 ENCOUNTER — Other Ambulatory Visit: Payer: Self-pay | Admitting: Cardiovascular Disease

## 2013-03-09 NOTE — Telephone Encounter (Signed)
Rx was sent to pharmacy electronically. 

## 2013-05-08 ENCOUNTER — Other Ambulatory Visit: Payer: Self-pay | Admitting: Neurology

## 2013-05-08 ENCOUNTER — Other Ambulatory Visit: Payer: Self-pay | Admitting: Cardiovascular Disease

## 2013-05-08 NOTE — Telephone Encounter (Signed)
Rx was sent to pharmacy electronically. 

## 2013-05-15 ENCOUNTER — Ambulatory Visit: Payer: Medicare Other | Admitting: Nurse Practitioner

## 2013-05-31 ENCOUNTER — Ambulatory Visit (INDEPENDENT_AMBULATORY_CARE_PROVIDER_SITE_OTHER): Payer: Medicare Other | Admitting: Nurse Practitioner

## 2013-05-31 ENCOUNTER — Encounter: Payer: Self-pay | Admitting: Nurse Practitioner

## 2013-05-31 VITALS — BP 128/77 | HR 51 | Ht 70.0 in | Wt 196.0 lb

## 2013-05-31 DIAGNOSIS — Z8673 Personal history of transient ischemic attack (TIA), and cerebral infarction without residual deficits: Secondary | ICD-10-CM

## 2013-05-31 DIAGNOSIS — G43809 Other migraine, not intractable, without status migrainosus: Secondary | ICD-10-CM

## 2013-05-31 NOTE — Progress Notes (Signed)
PATIENT: Eugene Murray DOB: [birth year]  REASON FOR VISIT: follow up HISTORY FROM: patient  HISTORY OF PRESENT ILLNESS: Eugene Murray is a 73 year old AA male who been having recurrent stereotypical episodes of right-sided weakness, heaviness and a strange feeling on the roof of the mouth on the right. These have been occurring for the last several weeks But have increased in last 1 week to 2-3 times a day He was admitted to [facility] on 06/30/12 with similar symptoms which had been ongoing off and on for one week. He had noticed some difficulty speaking as well. The symptoms lasted 10-15 minutes with a maximum episode lasting 20 minutes. CT scan was unremarkable. An MRI scan of the brain showed a equivocal tiny acute left pontomedullary junction infarct. MRA of the brain showed mild intracranial atherosclerotic changes. 2DEcho showed normal ejection fraction. Carotid Doppler showed no significant extracranial stenosis. Patient was brought to have a brainstem infarct due to small vessel disease and recurrent symptoms and was advised to add aspirin to his Plavix because of prior history of drug coated cardiac stents. Lipid profile was at goal. Patient states that his symptoms have persisted and despite adding aspirin and he's been having these now to 3 times a day. He does feel just symptoms are stereo typical and he gets an aura with a warning which is a strange feeling on the roof of his mouth on the inside of the right side. This is followed by a feeling of heaviness on the right side. He feels tired and needs to rest. This is followed by a mild headache which lasted for for A few hours. He denies known history of chronic migraines but does states that a few years ago he did have couple of episodes of transient visual distortions which were felt to be migraine variant. He does have some intermittent headaches off and on but he didn't feel he needed to go to the emergency room or take strong  medications for them. He does not describe specific triggers for these episodes. He does have history of sleep apnea and does use a CPAP which was last titrated a year ago and he feels it seems to be working fine. He does also complain of chronic low back pain with some recent increase in pain and some intermittent right leg weakness and ankle giving out. He did have epidural steroid injections in the past but and has no history of back surgery. He is not had any recent imaging studies of the back done.   UPDATE 11/15/12 (LL): Eugene Murray returns for follow up of TIA vs. Migraine variant symptoms and right leg weakness. After starting Topamax 50 mg at night, he noticed the right arm, right leg weakness, and the tingling sensation in the roof of his mouth disappeared after about 1 week on the medication. He tried going to twice a day but felt worse cognitively. He went back to once daily at night and is pleased. EEG was normal. MRI of the lumbar spine showed mild degenerative changes and mild central cord stenosis. He is taking Plavix and aspirin daily with no problems. Patient denies medication side effects, with no signs of bleeding or excessive bruising.   UPDATE 05/31/13 (LL):  Eugene Murray returns for follow up of TIA vs. Migraine variant symptoms and right leg weakness. BP is well controlled, it is 128/77 in the office today.  He is tolerating Plavix and aspirin daily (due to drug eluding stents)  with no signs  of bleeding or excessive bruising. He is having some mild headaches when he first gets up in the morning, across the left top side of his head, he thinks it is from his CPAP mask.  Chronic low back pain continues, but he stays active, goes to the gym regularly.  No new neurovascular symptoms.  ROS:  14 system review of systems is positive for headache and back pain.  ALLERGIES: Allergies  Allergen Reactions  . Other Hives    SEAFOOD  . Penicillins Other (See Comments)    "caused me to pass out"    . Sulfa Antibiotics Hives    HOME MEDICATIONS: Outpatient Prescriptions Prior to Visit  Medication Sig Dispense Refill  . amLODipine (NORVASC) 5 MG tablet TAKE 1 TABLET EVERY DAY  30 tablet  7  . Armodafinil (NUVIGIL) 150 MG tablet Take 150 mg by mouth as needed.      Marland Kitchen aspirin 81 MG tablet Take 81 mg by mouth daily.        Marland Kitchen atorvastatin (LIPITOR) 40 MG tablet TAKE 1 TABLET (40 MG TOTAL) BY MOUTH DAILY.  30 tablet  10  . clopidogrel (PLAVIX) 75 MG tablet TAKE 1 TABLET BY MOUTH EVERY DAY  30 tablet  7  . hydrochlorothiazide (HYDRODIURIL) 25 MG tablet TAKE 1 TABLET BY MOUTH DAILY  30 tablet  7  . lisinopril (PRINIVIL,ZESTRIL) 40 MG tablet TAKE 1 TABLET EVERY DAY  30 tablet  7  . metoprolol (LOPRESSOR) 50 MG tablet Take 1 tablet (50 mg total) by mouth 2 (two) times daily.  60 tablet  11  . nitroGLYCERIN (NITROSTAT) 0.4 MG SL tablet Place 0.4 mg under the tongue every 5 (five) minutes as needed.        . topiramate (TOPAMAX) 50 MG tablet Take 50 mg by mouth daily.      Marland Kitchen topiramate (TOPAMAX) 50 MG tablet Take 1 tablet (50 mg total) by mouth at bedtime.  30 tablet  6   No facility-administered medications prior to visit.     PHYSICAL EXAM  Filed Vitals:   05/31/13 0820  BP: 128/77  Pulse: 51  Height: 5\' 10"  (1.778 m)  Weight: 196 lb (88.905 kg)   Body mass index is 28.12 kg/(m^2).  Physical Exam  General: well developed, well nourished, seated, in no evident distress  Head: head normocephalic and atraumatic. Orohparynx benign  Neck: supple with no carotid or supraclavicular bruits  Cardiovascular: regular rate and rhythm, no murmurs  Musculoskeletal: no deformity. No back tenderness. Straight leg raising test is negative.  Skin: no rash/petichiae  Vascular: Normal pulses all extremities   Neurologic Exam  Mental Status: Awake and fully alert. Oriented to place and time. Recent and remote memory intact. Attention span, concentration and fund of knowledge appropriate. Mood and  affect appropriate.  Cranial Nerves: Fundoscopic exam reveals sharp disc margins. Pupils equal, briskly reactive to light. Extraocular movements full without nystagmus. Visual fields full to confrontation. Hearing intact. Facial sensation intact. Face, tongue, palate moves normally and symmetrically.  Motor: Normal bulk and tone. Normal strength in all tested extremity muscles.  Sensory.: intact to tough and pinprick and vibratory.  Coordination: Rapid alternating movements normal in all extremities. Finger-to-nose and heel-to-shin performed accurately bilaterally.  Gait and Station: Arises from chair without difficulty. Stance is normal. Gait demonstrates normal stride length and balance . Able to heel, toe and tandem walk without difficulty.  Reflexes: 1+ and symmetric except right knee jerk and ankle jerk are absent. Toes downgoing.  ASSESSMENT AND PLAN 73 year old male with recurrent episodes of transient right body heaviness, weakness as well as funny sensation in the mouth followed by mild headache possible complicated migraines. Doubt TIAs given the stereotypical nature of the symptoms. Recent history of small brainstem infarct 3 weeks ago from small vessel disease with vascular risk factors of sleep apnea, hypertension, hyperlipidemia and coronary artery disease. Chronic low back pain with mild degenerative lumbar disc and spine disease.   PLAN:  Continue aspirin and Plavix for stroke prevention given history of drug coated stents. Strict control of hypertension with blood pressure goal below 130/90 and lipids with LDL cholesterol goal below 100 mg percent.  Continue Topamax 50 mg daily for migraine prevention.  He wanted to hold off on carotid doppler study for 6 months. Return for followup in 6 months with Dr. Leonie Man or call earlier if necessary.   Philmore Pali, MSN, NP-C 05/31/2013, 8:22 AM [facility] [address], [place], [place] [address] [phone]  Note: This document was prepared with digital dictation and possible smart phrase technology. Any transcriptional errors that result from this process are unintentional.

## 2013-06-14 ENCOUNTER — Emergency Department (HOSPITAL_COMMUNITY)
Admission: EM | Admit: 2013-06-14 | Discharge: 2013-06-14 | Disposition: A | Discharge status: Home / Self Care | Payer: Medicare Other | Attending: Emergency Medicine | Admitting: Emergency Medicine

## 2013-06-14 ENCOUNTER — Encounter (HOSPITAL_COMMUNITY): Payer: Self-pay | Admitting: Emergency Medicine

## 2013-06-14 DIAGNOSIS — Z79899 Other long term (current) drug therapy: Secondary | ICD-10-CM | POA: Insufficient documentation

## 2013-06-14 DIAGNOSIS — R5383 Other fatigue: Secondary | ICD-10-CM

## 2013-06-14 DIAGNOSIS — Z880 Allergy status to penicillin: Secondary | ICD-10-CM | POA: Insufficient documentation

## 2013-06-14 DIAGNOSIS — Z7982 Long term (current) use of aspirin: Secondary | ICD-10-CM | POA: Insufficient documentation

## 2013-06-14 DIAGNOSIS — R21 Rash and other nonspecific skin eruption: Secondary | ICD-10-CM | POA: Insufficient documentation

## 2013-06-14 DIAGNOSIS — L539 Erythematous condition, unspecified: Secondary | ICD-10-CM | POA: Insufficient documentation

## 2013-06-14 DIAGNOSIS — Z87891 Personal history of nicotine dependence: Secondary | ICD-10-CM | POA: Insufficient documentation

## 2013-06-14 DIAGNOSIS — Z7901 Long term (current) use of anticoagulants: Secondary | ICD-10-CM | POA: Insufficient documentation

## 2013-06-14 DIAGNOSIS — Y92009 Unspecified place in unspecified non-institutional (private) residence as the place of occurrence of the external cause: Secondary | ICD-10-CM | POA: Insufficient documentation

## 2013-06-14 DIAGNOSIS — G4733 Obstructive sleep apnea (adult) (pediatric): Secondary | ICD-10-CM | POA: Insufficient documentation

## 2013-06-14 DIAGNOSIS — W57XXXA Bitten or stung by nonvenomous insect and other nonvenomous arthropods, initial encounter: Secondary | ICD-10-CM | POA: Insufficient documentation

## 2013-06-14 DIAGNOSIS — Z8546 Personal history of malignant neoplasm of prostate: Secondary | ICD-10-CM | POA: Insufficient documentation

## 2013-06-14 DIAGNOSIS — I2510 Atherosclerotic heart disease of native coronary artery without angina pectoris: Secondary | ICD-10-CM | POA: Insufficient documentation

## 2013-06-14 DIAGNOSIS — R51 Headache: Secondary | ICD-10-CM | POA: Insufficient documentation

## 2013-06-14 DIAGNOSIS — R5381 Other malaise: Secondary | ICD-10-CM | POA: Insufficient documentation

## 2013-06-14 DIAGNOSIS — I10 Essential (primary) hypertension: Secondary | ICD-10-CM | POA: Insufficient documentation

## 2013-06-14 DIAGNOSIS — E785 Hyperlipidemia, unspecified: Secondary | ICD-10-CM | POA: Insufficient documentation

## 2013-06-14 DIAGNOSIS — S30860A Insect bite (nonvenomous) of lower back and pelvis, initial encounter: Secondary | ICD-10-CM | POA: Insufficient documentation

## 2013-06-14 DIAGNOSIS — Y93H2 Activity, gardening and landscaping: Secondary | ICD-10-CM | POA: Insufficient documentation

## 2013-06-14 MED ORDER — DOXYCYCLINE HYCLATE 100 MG PO CAPS: 100.0000 mg | ORAL_CAPSULE | Freq: Two times a day (BID) | ORAL | Status: DC

## 2013-06-14 NOTE — Discharge Instructions (Signed)
Keep close eye on the wound. Apply bacitracin topically. Take doxycycline as prescribed until all gone. Follow up with primary care doctor for recheck.   Tick Bite Information Ticks are insects that attach themselves to the skin and draw blood for food. There are various types of ticks. Common types include wood ticks and deer ticks. Most ticks live in shrubs and grassy areas. Ticks can climb onto your body when you make contact with leaves or grass where the tick is waiting. The most common places on the body for ticks to attach themselves are the scalp, neck, armpits, waist, and groin. Most tick bites are harmless, but sometimes ticks carry germs that cause diseases. These germs can be spread to a person during the tick's feeding process. The chance of a disease spreading through a tick bite depends on:   The type of tick.  Time of year.   How long the tick is attached.   Geographic location.  HOW CAN YOU PREVENT TICK BITES? Take these steps to help prevent tick bites when you are outdoors:  Wear protective clothing. Long sleeves and long pants are best.   Wear white clothes so you can see ticks more easily.  Tuck your pant legs into your socks.   If walking on a trail, stay in the middle of the trail to avoid brushing against bushes.  Avoid walking through areas with long grass.  Put insect repellent on all exposed skin and along boot tops, pant legs, and sleeve cuffs.   Check clothing, hair, and skin repeatedly and before going inside.   Brush off any ticks that are not attached.  Take a shower or bath as soon as possible after being outdoors.  WHAT IS THE PROPER WAY TO REMOVE A TICK? Ticks should be removed as soon as possible to help prevent diseases caused by tick bites. 1. If latex gloves are available, put them on before trying to remove a tick.  2. Using fine-point tweezers, grasp the tick as close to the skin as possible. You may also use curved forceps or a  tick removal tool. Grasp the tick as close to its head as possible. Avoid grasping the tick on its body. 3. Pull gently with steady upward pressure until the tick lets go. Do not twist the tick or jerk it suddenly. This may break off the tick's head or mouth parts. 4. Do not squeeze or crush the tick's body. This could force disease-carrying fluids from the tick into your body.  5. After the tick is removed, wash the bite area and your hands with soap and water or other disinfectant such as alcohol. 6. Apply a small amount of antiseptic cream or ointment to the bite site.  7. Wash and disinfect any instruments that were used.  Do not try to remove a tick by applying a hot match, petroleum jelly, or fingernail polish to the tick. These methods do not work and may increase the chances of disease being spread from the tick bite.  WHEN SHOULD YOU SEEK MEDICAL CARE? Contact your health care provider if you are unable to remove a tick from your skin or if a part of the tick breaks off and is stuck in the skin.  After a tick bite, you need to be aware of signs and symptoms that could be related to diseases spread by ticks. Contact your health care provider if you develop any of the following in the days or weeks after the tick bite:  Unexplained  fever.  Rash. A circular rash that appears days or weeks after the tick bite may indicate the possibility of Lyme disease. The rash may resemble a target with a bull's-eye and may occur at a different part of your body than the tick bite.  Redness and swelling in the area of the tick bite.   Tender, swollen lymph glands.   Diarrhea.   Weight loss.   Cough.   Fatigue.   Muscle, joint, or bone pain.   Abdominal pain.   Headache.   Lethargy or a change in your level of consciousness.  Difficulty walking or moving your legs.   Numbness in the legs.   Paralysis.  Shortness of breath.   Confusion.   Repeated vomiting.   Document Released: 02/13/2000 Document Revised: 12/06/2012 Document Reviewed: 07/26/2012 [facility] Patient Information 2014 [place].

## 2013-06-14 NOTE — ED Notes (Signed)
Pt in stating he was bit by a tick approx a week ago, pulled it off of his lower back at that time and believed that the head was still attached, since that time a red area has remained with itching, also c/o generalized fatigue and intermittent headache, no distress noted.

## 2013-06-14 NOTE — ED Provider Notes (Signed)
CSN: [id number]     Arrival date & time 06/14/13  1649 History   None   This chart was scribed for non-physician practitioner, Jeannett Senior, PA, working with Virgel Manifold, MD by Terressa Koyanagi, ED Scribe. This patient was seen in room TR06C/TR06C and the patient's care was started at 5:43 PM.  PCP: Elizabeth Palau, MD  Chief Complaint  Patient presents with  . Tick bite     HPI HPI Comments: Eugene Murray is a 73 y.o. male, with a history of prostate CA, HTN, OSA, CAD, HLD and hypokalemia, who presents to the Emergency Department complaining of a tick bite onset 4 days ago. Pt reports his lower back was itching after doing some yard work on Sunday and he thought he was pulling off a scab, but it was a tick. Pt complains of associated intermittent continuing itching in lower back, fatigue and HA. Pt reports trying over the counter measures, including putting alcohol and witch hazel in the affected area, without relief.   Past Medical History  Diagnosis Date  . Allergic rhinitis   . History of prostate cancer   . Hypertension   . OSA (obstructive sleep apnea)     cpap machine at home  . Coronary artery disease   . Hyperlipidemia   . Hypokalemia    Past Surgical History  Procedure Laterality Date  . Hernia repair      x 2   . Prostate surgery      had cancer; radical resection 1997   . Carotid stent  11-08 and 4-11    stent x 3 (12/2006 x 2; 2011 x 1 stent)   Family History  Problem Relation Age of Onset  . Asthma Sister   . Colon cancer Father     possibly age 60 y.o diagnosed   . Stroke Mother    History  Substance Use Topics  . Smoking status: Former Smoker -- 15 years    Types: Cigarettes    Quit date: 09/08/1985  . Smokeless tobacco: Never Used  . Alcohol Use: 0.6 oz/week    1 Glasses of wine per week     Comment: one glass with dinner    Review of Systems  Constitutional: Positive for fatigue. Negative for fever.  Skin: Positive for color change  (erythema surrounding rash ) and rash.  Neurological: Positive for headaches.      Allergies  Other; Penicillins; and Sulfa antibiotics  Home Medications   Prior to Admission medications   Medication Sig Start Date End Date Taking? Authorizing Provider  amLODipine (NORVASC) 5 MG tablet TAKE 1 TABLET EVERY DAY 05/08/13   Troy Sine, MD  aspirin 81 MG tablet Take 81 mg by mouth daily.      Historical Provider, MD  atorvastatin (LIPITOR) 40 MG tablet TAKE 1 TABLET (40 MG TOTAL) BY MOUTH DAILY. 03/09/13   Troy Sine, MD  clopidogrel (PLAVIX) 75 MG tablet TAKE 1 TABLET BY MOUTH EVERY DAY 05/08/13   Troy Sine, MD  hydrochlorothiazide (HYDRODIURIL) 25 MG tablet TAKE 1 TABLET BY MOUTH DAILY 05/08/13   Troy Sine, MD  lisinopril (PRINIVIL,ZESTRIL) 40 MG tablet TAKE 1 TABLET EVERY DAY 05/08/13   Troy Sine, MD  metoprolol (LOPRESSOR) 50 MG tablet Take 1 tablet (50 mg total) by mouth 2 (two) times daily. 02/02/13   Troy Sine, MD  nitroGLYCERIN (NITROSTAT) 0.4 MG SL tablet Place 0.4 mg under the tongue every 5 (five) minutes as needed.  Historical Provider, MD  topiramate (TOPAMAX) 50 MG tablet Take 1 tablet (50 mg total) by mouth at bedtime. 05/08/13   Philmore Pali, NP   Triage Vitals: BP 128/75  Pulse 68  Temp(Src) 97.6 F (36.4 C) (Oral)  Resp 20  SpO2 99% Physical Exam  Nursing note and vitals reviewed. Constitutional: He is oriented to person, place, and time. He appears well-developed and well-nourished. No distress.  HENT:  Head: Normocephalic and atraumatic.  Eyes: EOM are normal.  Neck: Neck supple. No tracheal deviation present.  Cardiovascular: Normal rate.   Pulmonary/Chest: Effort normal. No respiratory distress.  Musculoskeletal: Normal range of motion.  Neurological: He is alert and oriented to person, place, and time.  Skin: Skin is warm and dry. Rash noted. There is erythema.  Papule with surrounding erythema of the small lower back. Part of the tick  still intact in the skin. No drainage.   Psychiatric: He has a normal mood and affect. His behavior is normal.    ED Course  FOREIGN BODY REMOVAL Date/Time: 06/14/2013 5:59 PM Performed by: Jeannett Senior A Authorized by: Jeannett Senior A Consent: Verbal consent obtained. written consent not obtained. Risks and benefits: risks, benefits and alternatives were discussed Consent given by: patient Patient understanding: patient states understanding of the procedure being performed Patient identity confirmed: verbally with patient Body area: skin General location: trunk Location details: back Patient sedated: no Patient restrained: no Patient cooperative: yes Dressing: antibiotic ointment and dressing applied Comments: 18guage needle used to remove tick parts.    (including critical care time) DIAGNOSTIC STUDIES: Oxygen Saturation is 99% on RA, normal by my interpretation.    COORDINATION OF CARE: 5:46 PM-Discussed treatment plan which includes removal of the head of the tick with pt at bedside and pt agreed to plan.   Labs Review Labs Reviewed - No data to display  Imaging Review No results found.   EKG Interpretation None      MDM   Final diagnoses:  None   Patient with a tick bite to the right lower back. It appears that parts of the take still in the skin, removed using an 18-gauge needle. Band-Aid and bacitracin applied. Given patient's complaint of a headache, generalized fatigue, and prolonged bite will start on doxycycline. Patient instructed to followup with a primary care Dr. Patient is afebrile here, no rash, other than the bite, no signs of acute illness.   Filed Vitals:   06/14/13 1719  BP: 128/75  Pulse: 68  Temp: 97.6 F (36.4 C)  TempSrc: Oral  Resp: 20  SpO2: 99%    I personally performed the services described in this documentation, which was scribed in my presence. The recorded information has been reviewed and is  accurate.    Renold Genta, PA-C 06/14/13 1800

## 2013-06-18 NOTE — ED Provider Notes (Signed)
Medical screening examination/treatment/procedure(s) were performed by non-physician practitioner and as supervising physician I was immediately available for consultation/collaboration.   EKG Interpretation None       Virgel Manifold, MD 06/18/13 [phone]

## 2013-08-09 ENCOUNTER — Encounter: Payer: Self-pay | Admitting: Neurology

## 2013-12-04 ENCOUNTER — Ambulatory Visit: Payer: Medicare Other | Admitting: Neurology

## 2013-12-06 ENCOUNTER — Other Ambulatory Visit: Payer: Self-pay | Admitting: Cardiovascular Disease

## 2013-12-06 ENCOUNTER — Ambulatory Visit: Payer: Medicare Other | Admitting: Internal Medicine

## 2013-12-06 ENCOUNTER — Encounter: Payer: Self-pay | Admitting: Internal Medicine

## 2013-12-06 VITALS — BP 118/84 | HR 64 | Ht 70.75 in | Wt 205.2 lb

## 2013-12-06 DIAGNOSIS — Z23 Encounter for immunization: Secondary | ICD-10-CM

## 2013-12-06 NOTE — Telephone Encounter (Signed)
Rx was sent to pharmacy electronically. 

## 2013-12-06 NOTE — Progress Notes (Signed)
Subjective:    Patient ID: Eugene Murray, male    DOB: 1940-07-03, 73 y.o.   MRN: [id number]  HPI 09/09/10- 31 yoM former smoker, coming to re-establish for OSA. Last here 05/17/06- note reviewed. NPSG 01/01/05- AHI 42.9/hr. Since then he has continued using CPAP 10 all night, every night ([facility]). Current machine works ok . He has a new full face mask. He feels he sleeps well, with little waking at night, no snore through. He and his wife are concerned that he still seems to fall asleep easily in the day time, especially if he sits. Stops to nap at rest stops on long drives. Seldom caffeine. He dozes off and on and stays up late to channel surf, sometimes 2AM, then up at 6 AM.  Hx CAD w/ stents- Dr Claiborne Billings.   09/09/11- 87 yoM former smoker followed for OSA, complicated by hx HBP, Allergic rhinitis Wears CPAP 10 every night for approximately 6 hours and still gets sleepy during the day-can fall asleep if sitting down. Sleepy only if sitting quietly, and he doesn't mind. Denies snoring through his mask. Admits he often will stay up until 2:00 watching TV and recognizes that his part why he is sleepy. We discussed sleep hygiene and CPAP.  12/05/12- 60 yoM former smoker followed for OSA, complicated by hx HBP, Allergic rhinitis FOLLOWS FOR: Wears CPAP 10/ [facility] every night for about 6 hours; pressure okay for patient.  Wakes in the morning with left temple headache, possibly from strap. This clears quickly. Hospitalized with TIA May 7, history of migraine. Still some daytime sleepiness watching TV. Little caffeine  12/06/13- 72 yoM former smoker followed for OSA, complicated by hx HBP, Allergic rhinitis, hx TIA FOLLOW FOR: Sleep Apnea, uses CPAP every night, P14; mask fits fine; no further complaints CPAP 14/ [facility]   ROS-see HPI Constitutional:   No-   weight loss, night sweats, fevers, chills,+fatigue, lassitude. HEENT:   + headaches, no-difficulty swallowing, tooth/dental problems, sore throat,    No-  sneezing, itching, ear ache, nasal congestion, post nasal drip,  CV:  No-   chest pain, orthopnea, PND, swelling in lower extremities, anasarca, dizziness, palpitations Resp: No-   shortness of breath with exertion or at rest.              No-   productive cough,  No non-productive cough,  No- coughing up of blood.              No-   change in color of mucus.  No- wheezing.   Skin: No-   rash or lesions. GI:  No-   heartburn, indigestion, abdominal pain, nausea, vomiting,  GU:  MS:  No-   joint pain or swelling.   Neuro-     nothing unusual Psych:  No- change in mood or affect. No depression or anxiety.  No memory loss.  Objective:   Physical Exam General- Alert, Oriented, Affect-appropriate, Distress- none acute,  Medium build, laconic affect Skin- rash-none, lesions- none, excoriation- none Lymphadenopathy- none Head- atraumatic            Eyes- Gross vision intact, PERRLA, conjunctivae clear secretions            Ears- Hearing, canals            Nose- Clear, Septal dev, mucus, polyps, erosion, perforation             Throat- Mallampati III , mucosa clear , drainage- none, tonsils- atrophic Neck- flexible , trachea midline,  no stridor , thyroid nl, carotid no bruit Chest - symmetrical excursion , unlabored           Heart/CV- RRR , no murmur , no gallop  , no rub, nl s1 s2                           - JVD- none , edema- none, stasis changes- none, varices- none           Lung- clear to P&A, wheeze- none, cough- none , dullness-none, rub- none           Chest wall-  Abd-  Br/ Gen/ Rectal- Not done, not indicated Extrem- cyanosis- none, clubbing, none, atrophy- none, strength- nl Neuro- grossly intact to observation  Assessment & Plan:

## 2013-12-06 NOTE — Patient Instructions (Signed)
Flu vax  You can talk with Eugene Murray about when you might be able to get a new CPAP machine  Please call as needed

## 2014-01-26 ENCOUNTER — Other Ambulatory Visit: Payer: Self-pay | Admitting: Nurse Practitioner

## 2014-01-28 ENCOUNTER — Other Ambulatory Visit: Payer: Self-pay | Admitting: *Deleted

## 2014-01-28 MED ORDER — HYDROCHLOROTHIAZIDE 25 MG PO TABS: 25.0000 mg | ORAL_TABLET | Freq: Every day | ORAL | Status: DC

## 2014-01-28 MED ORDER — CLOPIDOGREL BISULFATE 75 MG PO TABS: 75.0000 mg | ORAL_TABLET | Freq: Every day | ORAL | Status: DC

## 2014-01-28 MED ORDER — AMLODIPINE BESYLATE 5 MG PO TABS
5.0000 mg | ORAL_TABLET | Freq: Every day | ORAL | Status: DC
Start: 2014-01-28 — End: 2014-03-14

## 2014-01-28 MED ORDER — LISINOPRIL 40 MG PO TABS: 40.0000 mg | ORAL_TABLET | Freq: Every day | ORAL | Status: DC

## 2014-02-04 ENCOUNTER — Other Ambulatory Visit: Payer: Self-pay | Admitting: Cardiovascular Disease

## 2014-02-05 NOTE — Telephone Encounter (Signed)
Rx refill sent to patient pharmacy   

## 2014-03-04 ENCOUNTER — Other Ambulatory Visit: Payer: Self-pay | Admitting: Cardiovascular Disease

## 2014-03-04 NOTE — Telephone Encounter (Signed)
Rx(s) sent to pharmacy electronically. OV 03/07/14

## 2014-03-07 ENCOUNTER — Encounter: Payer: Self-pay | Admitting: Cardiovascular Disease

## 2014-03-07 ENCOUNTER — Ambulatory Visit (INDEPENDENT_AMBULATORY_CARE_PROVIDER_SITE_OTHER): Payer: Medicare Other | Admitting: Cardiovascular Disease

## 2014-03-07 VITALS — BP 128/66 | HR 57 | Ht 70.0 in | Wt 203.1 lb

## 2014-03-07 DIAGNOSIS — Z79899 Other long term (current) drug therapy: Secondary | ICD-10-CM

## 2014-03-07 DIAGNOSIS — R001 Bradycardia, unspecified: Secondary | ICD-10-CM | POA: Diagnosis not present

## 2014-03-07 DIAGNOSIS — R5381 Other malaise: Secondary | ICD-10-CM

## 2014-03-07 DIAGNOSIS — I2510 Atherosclerotic heart disease of native coronary artery without angina pectoris: Secondary | ICD-10-CM | POA: Diagnosis not present

## 2014-03-07 DIAGNOSIS — I10 Essential (primary) hypertension: Secondary | ICD-10-CM

## 2014-03-07 DIAGNOSIS — E785 Hyperlipidemia, unspecified: Secondary | ICD-10-CM

## 2014-03-07 NOTE — Progress Notes (Signed)
Patient ID: MARCELLES CLINARD, male   DOB: 1940/08/23, 74 y.o.   MRN: [id number]      HPI: PARKE JANDREAU  is a 74 y.o. male presents to the office today for cardiology evaluation who presents to the office for a one year cardiology evaluation.  I saw him in November 2014.  Mr. Poth  has a history of hypertension, hyperlipidemia, obstructive sleep apnea on CPAP therapy, history of prostate cancer, and established coronary artery disease. In November 2008, he underwent initial percutaneous coronary intervention to his diagonal vessel and right coronary arteries. In April 2011 he was found to have a new 95% stenosis beyond is widely patent RCA stent in the distal RCA at which time a 3.5x12 mm Promus DES stent was inserted. His diagonal stent was patent. He did have 20% mid LAD stenosis. In June 2013 and perfusion study was normal. In 2014 he experienced symptoms of weakness involving his right arm with slowness of speech and dysarthria. He was seen by the stroke team. A CT of his head did not show acute intracranial abnormality. MRI showed an equivocal tiny acute left pontomedullary junction stroke. He had mild atherosclerotic changes but otherwise unremarkable. He had seen a neurologist and was started on topiramate. He has been stable neurologically.  Over the past year from a cardiac standpoint, he has remained relatively stable without chest pain.  He goes to the gym approximately 2-3 times per week.  He denies any significant episodes of recurrent chest pain.  There have been some short fleeting episodes.  He denies shortness of breath. He denies tachycardia palpitations. He denies presyncope or syncope. He does have hyperlipidemia and has been tolerating atorvastatin 40 mg.  He denies bleeding with continuation of dual antiplatelet therapy.  He is unaware of any significant blood pressure elevation is current regimen consisting of amlodipine 5 mg, HCTZ 25 mg, lisinopril 40 mg, and metoprolol 50 mg.  Last  year he underwent colonoscopy and held his Plavix and aspirin for 5 days.   Past Medical History  Diagnosis Date  . Allergic rhinitis   . History of prostate cancer   . Hypertension   . OSA (obstructive sleep apnea)     cpap machine at home  . Coronary artery disease   . Hyperlipidemia   . Hypokalemia     Past Surgical History  Procedure Laterality Date  . Hernia repair      x 2   . Prostate surgery      had cancer; radical resection 1997   . Carotid stent  11-08 and 4-11    stent x 3 (12/2006 x 2; 2011 x 1 stent)    Allergies  Allergen Reactions  . Other Hives    SEAFOOD  . Penicillins Other (See Comments)    "caused me to pass out"  . Sulfa Antibiotics Hives    Current Outpatient Prescriptions  Medication Sig Dispense Refill  . amLODipine (NORVASC) 5 MG tablet Take 1 tablet (5 mg total) by mouth daily. Need appointment before further refills 30 tablet 0  . aspirin 81 MG tablet Take 81 mg by mouth daily.      Marland Kitchen atorvastatin (LIPITOR) 40 MG tablet Take 40 mg by mouth at bedtime.    . clopidogrel (PLAVIX) 75 MG tablet Take 1 tablet (75 mg total) by mouth daily with breakfast. Need appointment before further refills 30 tablet 0  . hydrochlorothiazide (HYDRODIURIL) 25 MG tablet Take 1 tablet (25 mg total) by mouth daily. Need  appointment before further refills 30 tablet 0  . lisinopril (PRINIVIL,ZESTRIL) 40 MG tablet Take 1 tablet (40 mg total) by mouth daily. Need appointment before further refills 30 tablet 0  . metoprolol (LOPRESSOR) 50 MG tablet Take 1 tablet (50 mg total) by mouth 2 (two) times daily. 60 tablet 0  . nitroGLYCERIN (NITROSTAT) 0.4 MG SL tablet Place 1 tablet (0.4 mg total) under the tongue every 5 (five) minutes as needed for chest pain (<please make appointment>). 25 tablet 0  . topiramate (TOPAMAX) 50 MG tablet TAKE 1 TABLET (50 MG TOTAL) BY MOUTH AT BEDTIME. 90 tablet 0   No current facility-administered medications for this visit.    Socially he is  married has 3 children and 2 grandchildren. He does drink occasional alcohol. There is no tobacco use. He does exercise occasionally.  ROS General: Negative; No fevers, chills, or night sweats;  HEENT: Negative; No changes in vision or hearing, sinus congestion, difficulty swallowing Pulmonary: Negative; No cough, wheezing, shortness of breath, hemoptysis Cardiovascular: Negative; No chest pain, presyncope, syncope, palpitations GI: Negative; No nausea, vomiting, diarrhea, or abdominal pain GU: History of prostate CA No dysuria, hematuria, or difficulty voiding Musculoskeletal: History of back discomfort intermittently; no myalgias, joint pain, or weakness Hematologic/Oncology: Negative; no easy bruising, bleeding Endocrine: Negative; no heat/cold intolerance; no diabetes Neuro: Negative; no changes in balance, headaches Skin: Negative; No rashes or skin lesions Psychiatric: Negative; No behavioral problems, depression Sleep: Negative; No snoring, daytime sleepiness, hypersomnolence, bruxism, restless legs, hypnogognic hallucinations, no cataplexy Other comprehensive 14 point system review is negative.  PE BP 128/66 mmHg  Pulse 57  Ht 5\' 10"  (1.778 m)  Wt 203 lb 1.6 oz (92.126 kg)  BMI 29.14 kg/m2  General: Alert, oriented, no distress.  HEENT: Normocephalic, atraumatic. Pupils round and reactive; sclera anicteric;  Nose with nasal septal hypertrophy Mouth/Parynx benign; Mallinpatti scale 3 Neck: No JVD, no carotid bruits with normal carotid upstroke Lungs: clear to ausculatation and percussion; no wheezing or rales Chest wall: Nontender to palpation Heart: RRR, s1 s2 normal 1/6 systolic ejection murmur, unchanged; no diastolic murmur.  No rubs thrills or heaves Abdomen: soft, nontender; no hepatosplenomehaly, BS+; abdominal aorta nontender and not dilated by palpation. Back: No CVA tenderness Pulses 2+ Extremities: no clubbinbg cyanosis or edema, Homan's sign negative    Neurologic: grossly nonfocal Psychologic: Normal affect and mood  ECG (independently read by me): Sinus bradycardia at 57 bpm.  No significant ST segment changes.  November 2014 ECG sinus rhythm at 57 beats per minute. PR interval 190 ms QTc interval 377 ms  LABS:  BMET    Component Value Date/Time   NA 139 07/01/2012 0450   K 3.1* 07/01/2012 0450   CL 101 07/01/2012 0450   CO2 28 07/01/2012 0450   GLUCOSE 91 07/01/2012 0450   BUN 14 07/01/2012 0450   CREATININE 1.01 07/01/2012 0450   CALCIUM 8.5 07/01/2012 0450   GFRNONAA 72* 07/01/2012 0450   GFRAA 84* 07/01/2012 0450     Hepatic Function Panel     Component Value Date/Time   PROT 7.1 06/30/2012 1254   ALBUMIN 3.7 06/30/2012 1254   AST 19 06/30/2012 1254   ALT 14 06/30/2012 1254   ALKPHOS 69 06/30/2012 1254   BILITOT 0.5 06/30/2012 1254    CBC    Component Value Date/Time   WBC 6.1 07/01/2012 0450   RBC 4.41 07/01/2012 0450   HGB 13.0 07/01/2012 0450   HCT 36.8* 07/01/2012 0450   PLT 135*  07/01/2012 0450   MCV 83.4 07/01/2012 0450   MCH 29.5 07/01/2012 0450   MCHC 35.3 07/01/2012 0450   RDW 13.4 07/01/2012 0450   LYMPHSABS 2.1 06/30/2012 1254   MONOABS 0.4 06/30/2012 1254   EOSABS 0.1 06/30/2012 1254   BASOSABS 0.0 06/30/2012 1254     Lipid Panel     Component Value Date/Time   CHOL 113 07/01/2012 0450   TRIG 114 07/01/2012 0450   HDL 28* 07/01/2012 0450   CHOLHDL 4.0 07/01/2012 0450   VLDL 23 07/01/2012 0450   LDLCALC 62 07/01/2012 0450    RADIOLOGY: Ct Head (brain) Wo Contrast  06/30/2012   *RADIOLOGY REPORT*  Clinical Data: Acute onset right upper and lower extremity weakness.  Right-sided foot drop.  CT HEAD WITHOUT CONTRAST  Technique:  Contiguous axial images were obtained from the base of the skull through the vertex without contrast.  Comparison: None.  Findings: No evidence of acute infarct, acute hemorrhage, mass lesion, mass effect or hydrocephalus.  Minimal periventricular low  attenuation.  Small retention cysts or polyps are seen in the left maxillary sinus.  IMPRESSION:  1.  No acute findings. 2.  Minimal chronic microvascular white matter ischemic changes.   Original Report Authenticated By: Lorin Picket, M.D.   Mr Angiogram Head Wo Contrast  06/30/2012   *RADIOLOGY REPORT*  Clinical Data:  Right-sided weakness.  Hypertension.  History prostate cancer.  MRI BRAIN WITHOUT CONTRAST MRA HEAD WITHOUT CONTRAST  Technique: Multiplanar, multiecho pulse sequences of the brain and surrounding structures were obtained according to standard protocol without intravenous contrast.  Angiographic images of the head were obtained using MRA technique without contrast.  Comparison: 06/30/2012 head CT.  No comparison brain MR.  MRI HEAD  Findings:  Question tiny acute infarct left pontomedullary junction.  Moderate white matter type changes most consistent with result of small vessel disease.  No intracranial hemorrhage.  No hydrocephalus.  No intracranial mass lesion detected on this unenhanced exam.  Major intracranial vascular structures are patent.  Upper cervical cord is of decreased caliber of questionable significance/etiology.  Cervical medullary junction, pituitary region and pineal region unremarkable.  Exophthalmos.  IMPRESSION: Question tiny acute infarct left pontomedullary junction.  Moderate white matter type changes most consistent with result of small vessel disease.  Please see above  MRA HEAD  Findings: Anterior circulation without medium or large size vessel significant stenosis or occlusion.  Middle cerebral artery mild branch vessel irregularity bilaterally.  Mild narrowing distal M1 segment left middle cerebral artery.  Ectatic vertebral arteries and basilar artery.  Nonvisualization left PICA and right AICA.  Mild branch vessel irregularity superior cerebellar artery and posterior cerebral artery bilaterally.  No aneurysm or vascular malformation noted.  IMPRESSION: Mild  intracranial atherosclerotic type changes as detailed above the   Original Report Authenticated By: Genia Del, M.D.   Mr Brain Wo Contrast  06/30/2012   *RADIOLOGY REPORT*  Clinical Data:  Right-sided weakness.  Hypertension.  History prostate cancer.  MRI BRAIN WITHOUT CONTRAST MRA HEAD WITHOUT CONTRAST  Technique: Multiplanar, multiecho pulse sequences of the brain and surrounding structures were obtained according to standard protocol without intravenous contrast.  Angiographic images of the head were obtained using MRA technique without contrast.  Comparison: 06/30/2012 head CT.  No comparison brain MR.  MRI HEAD  Findings:  Question tiny acute infarct left pontomedullary junction.  Moderate white matter type changes most consistent with result of small vessel disease.  No intracranial hemorrhage.  No hydrocephalus.  No intracranial  mass lesion detected on this unenhanced exam.  Major intracranial vascular structures are patent.  Upper cervical cord is of decreased caliber of questionable significance/etiology.  Cervical medullary junction, pituitary region and pineal region unremarkable.  Exophthalmos.  IMPRESSION: Question tiny acute infarct left pontomedullary junction.  Moderate white matter type changes most consistent with result of small vessel disease.  Please see above  MRA HEAD  Findings: Anterior circulation without medium or large size vessel significant stenosis or occlusion.  Middle cerebral artery mild branch vessel irregularity bilaterally.  Mild narrowing distal M1 segment left middle cerebral artery.  Ectatic vertebral arteries and basilar artery.  Nonvisualization left PICA and right AICA.  Mild branch vessel irregularity superior cerebellar artery and posterior cerebral artery bilaterally.  No aneurysm or vascular malformation noted.  IMPRESSION: Mild intracranial atherosclerotic type changes as detailed above the   Original Report Authenticated By: Genia Del, M.D.       ASSESSMENT AND PLAN: Mr. Amjad Fikes is a 74 year old African-American male with established CAD who underwent initial percutaneous coronary intervention in November 2008 to his diagonal and RCA in April 2011 was found to have a new 95% stenosis beyond his distal RCA stent for which a new DES stent was inserted.  His last stress test was in 2013.  He has been able to exercise approximately 2-3 days per week.  He states his heart rate typically only gets to 100-102 bpm.  He does not significant only push himself.  In the past, he recalls that he did experience chest discomfort.  If his heart rate would exceed 120.  He has not had recent laboratory.  I'm checking a complete set of blood work consisting of a comparable intensive metabolic panel, CBC, TSH, and lipid studies.  His ECG today is stable.  His blood pressure today is controlled on his regimen consisting of amlodipine, HCTZ, lisinopril, and metoprolol.  He is not having bleeding on dual antiplatelet therapy.  He denies myalgias on his atorvastatin.  He denies any recent neurologic symptoms.  He continues to use CPAP therapy with 100% compliance.  He denies any breakthrough snoring.  He denies symptoms of hypersomnolence.  In  6 months I am scheduling him for a 3 year follow-up exercise Myoview study.  I will see him back in the office for follow up evaluation.  Time spent: 25 minutes  KELLY,THOMAS A 03/07/2014 9:48 AM

## 2014-03-07 NOTE — Patient Instructions (Signed)
Dr Claiborne Billings has ordered the following test(s) to be done: 1. Blood work - this is to be done FASTING 2. Exercise Stress Myoview - this will be done in 6 months. For further information please visit [email or web address]. Please follow instruction sheet, as given.  Dr Claiborne Billings wants you to follow-up in 6 months after your stress test. You will receive a reminder letter in the mail one months in advance. If you don't receive a letter, please call our office to schedule the follow-up appointment.

## 2014-03-12 DIAGNOSIS — R5381 Other malaise: Secondary | ICD-10-CM | POA: Diagnosis not present

## 2014-03-12 DIAGNOSIS — Z79899 Other long term (current) drug therapy: Secondary | ICD-10-CM | POA: Diagnosis not present

## 2014-03-12 DIAGNOSIS — E785 Hyperlipidemia, unspecified: Secondary | ICD-10-CM | POA: Diagnosis not present

## 2014-03-13 LAB — CBC
HEMATOCRIT: 41.7 % (ref 39.0–52.0)
HEMOGLOBIN: 14.5 g/dL (ref 13.0–17.0)
MCH: 30.5 pg (ref 26.0–34.0)
MCHC: 34.8 g/dL (ref 30.0–36.0)
MCV: 87.8 fL (ref 78.0–100.0)
MPV: 9.1 fL (ref 8.6–12.4)
Platelets: 144 10*3/uL — ABNORMAL LOW (ref 150–400)
RBC: 4.75 MIL/uL (ref 4.22–5.81)
RDW: 14.1 % (ref 11.5–15.5)
WBC: 5.8 10*3/uL (ref 4.0–10.5)

## 2014-03-13 LAB — COMPREHENSIVE METABOLIC PANEL
ALBUMIN: 3.9 g/dL (ref 3.5–5.2)
ALK PHOS: 58 U/L (ref 39–117)
ALT: 12 U/L (ref 0–53)
AST: 15 U/L (ref 0–37)
BUN: 18 mg/dL (ref 6–23)
CALCIUM: 8.6 mg/dL (ref 8.4–10.5)
CHLORIDE: 104 meq/L (ref 96–112)
CO2: 31 mEq/L (ref 19–32)
Creat: 0.84 mg/dL (ref 0.50–1.35)
Glucose, Bld: 79 mg/dL (ref 70–99)
POTASSIUM: 3.7 meq/L (ref 3.5–5.3)
Sodium: 140 mEq/L (ref 135–145)
Total Bilirubin: 0.6 mg/dL (ref 0.2–1.2)
Total Protein: 6.7 g/dL (ref 6.0–8.3)

## 2014-03-13 LAB — LIPID PANEL
Cholesterol: 117 mg/dL (ref 0–200)
HDL: 33 mg/dL — ABNORMAL LOW (ref >39)
LDL CALC: 72 mg/dL (ref 0–99)
TRIGLYCERIDES: 61 mg/dL (ref <150)
Total CHOL/HDL Ratio: 3.5 Ratio
VLDL: 12 mg/dL (ref 0–40)

## 2014-03-13 LAB — TSH: TSH: 0.765 u[IU]/mL (ref 0.350–4.500)

## 2014-03-14 ENCOUNTER — Other Ambulatory Visit: Payer: Self-pay | Admitting: Cardiovascular Disease

## 2014-03-14 NOTE — Telephone Encounter (Signed)
Rx(s) sent to pharmacy electronically.  

## 2014-04-02 ENCOUNTER — Encounter: Payer: Self-pay | Admitting: Neurology

## 2014-04-02 ENCOUNTER — Telehealth: Payer: Self-pay | Admitting: *Deleted

## 2014-04-02 ENCOUNTER — Ambulatory Visit (INDEPENDENT_AMBULATORY_CARE_PROVIDER_SITE_OTHER): Payer: Medicare Other | Admitting: Neurology

## 2014-04-02 VITALS — BP 114/69 | HR 61 | Ht 70.75 in | Wt 204.0 lb

## 2014-04-02 DIAGNOSIS — R51 Headache: Secondary | ICD-10-CM | POA: Diagnosis not present

## 2014-04-02 DIAGNOSIS — R519 Headache, unspecified: Secondary | ICD-10-CM

## 2014-04-02 DIAGNOSIS — I2510 Atherosclerotic heart disease of native coronary artery without angina pectoris: Secondary | ICD-10-CM | POA: Diagnosis not present

## 2014-04-02 NOTE — Progress Notes (Signed)
PATIENT: Eugene Murray DOB: [birth year]  REASON FOR VISIT: follow up HISTORY FROM: patient  HISTORY OF PRESENT ILLNESS: [name] is a 74 year old AA male who been having recurrent stereotypical episodes of right-sided weakness, heaviness and a strange feeling on the roof of the mouth on the right. These have been occurring for the last several weeks But have increased in last 1 week to 2-3 times a day He was admitted to [facility] on 06/30/12 with similar symptoms which had been ongoing off and on for one week. He had noticed some difficulty speaking as well. The symptoms lasted 10-15 minutes with a maximum episode lasting 20 minutes. CT scan was unremarkable. An MRI scan of the brain showed a equivocal tiny acute left pontomedullary junction infarct. MRA of the brain showed mild intracranial atherosclerotic changes. 2DEcho showed normal ejection fraction. Carotid Doppler showed no significant extracranial stenosis. Patient was brought to have a brainstem infarct due to small vessel disease and recurrent symptoms and was advised to add aspirin to his Plavix because of prior history of drug coated cardiac stents. Lipid profile was at goal. Patient states that his symptoms have persisted and despite adding aspirin and he's been having these now to 3 times a day. He does feel just symptoms are stereo typical and he gets an aura with a warning which is a strange feeling on the roof of his mouth on the inside of the right side. This is followed by a feeling of heaviness on the right side. He feels tired and needs to rest. This is followed by a mild headache which lasted for for A few hours. He denies known history of chronic migraines but does states that a few years ago he did have couple of episodes of transient visual distortions which were felt to be migraine variant. He does have some intermittent headaches off and on but he didn't feel he needed to go to the emergency room or take strong  medications for them. He does not describe specific triggers for these episodes. He does have history of sleep apnea and does use a CPAP which was last titrated a year ago and he feels it seems to be working fine. He does also complain of chronic low back pain with some recent increase in pain and some intermittent right leg weakness and ankle giving out. He did have epidural steroid injections in the past but and has no history of back surgery. He is not had any recent imaging studies of the back done.   UPDATE 11/15/12 (LL): Eugene Murray returns for follow up of TIA vs. Migraine variant symptoms and right leg weakness. After starting Topamax 50 mg at night, he noticed the right arm, right leg weakness, and the tingling sensation in the roof of his mouth disappeared after about 1 week on the medication. He tried going to twice a day but felt worse cognitively. He went back to once daily at night and is pleased. EEG was normal. MRI of the lumbar spine showed mild degenerative changes and mild central cord stenosis. He is taking Plavix and aspirin daily with no problems. Patient denies medication side effects, with no signs of bleeding or excessive bruising.   UPDATE 05/31/13 (LL):  Eugene Murray returns for follow up of TIA vs. Migraine variant symptoms and right leg weakness. BP is well controlled, it is 128/77 in the office today.  He is tolerating Plavix and aspirin daily (due to drug eluding stents)  with no signs  of bleeding or excessive bruising. He is having some mild headaches when he first gets up in the morning, across the left top side of his head, he thinks it is from his CPAP mask.  Chronic low back pain continues, but he stays active, goes to the gym regularly.  No new neurovascular symptoms. Update 04/02/2014 ; she returns for follow-up after last visit in April 2015. He continues to do well without any episodes of recurrent paresthesias for completed migraine. He does have minor headaches once or twice a  week which are 5/10 in severity. Pressure-like in quality which she can tolerate without medications. There is no accompanying light or sound sensitivity or any clear triggers. He does have sleep apnea and uses CPAP every night regularly. He is currently taking Topamax 50 mg at night and tolerating it well without any side effects. He remains on aspirin and Plavix because of cardiac stents and is tolerating it well with only minor bruising and no significant bleeding episodes. He has no new complaints today.  ROS:  14 system review of systems is positive for apnoea , headaches and snoring only and all other systems negative.  ALLERGIES: Allergies  Allergen Reactions  . Other Hives    SEAFOOD  . Penicillins Other (See Comments)    "caused me to pass out"  . Sulfa Antibiotics Hives    HOME MEDICATIONS: Outpatient Prescriptions Prior to Visit  Medication Sig Dispense Refill  . amLODipine (NORVASC) 5 MG tablet Take 1 tablet (5 mg total) by mouth daily. 30 tablet 11  . aspirin 81 MG tablet Take 81 mg by mouth daily.      Marland Kitchen atorvastatin (LIPITOR) 40 MG tablet Take 40 mg by mouth at bedtime.    . clopidogrel (PLAVIX) 75 MG tablet Take 1 tablet (75 mg total) by mouth daily. 30 tablet 11  . hydrochlorothiazide (HYDRODIURIL) 25 MG tablet Take 1 tablet (25 mg total) by mouth daily. 30 tablet 11  . lisinopril (PRINIVIL,ZESTRIL) 40 MG tablet Take 1 tablet (40 mg total) by mouth daily. 30 tablet 11  . metoprolol (LOPRESSOR) 50 MG tablet Take 1 tablet (50 mg total) by mouth 2 (two) times daily. 60 tablet 0  . nitroGLYCERIN (NITROSTAT) 0.4 MG SL tablet Place 1 tablet (0.4 mg total) under the tongue every 5 (five) minutes as needed for chest pain (<please make appointment>). 25 tablet 0  . topiramate (TOPAMAX) 50 MG tablet TAKE 1 TABLET (50 MG TOTAL) BY MOUTH AT BEDTIME. 90 tablet 0  . atorvastatin (LIPITOR) 40 MG tablet Take 1 tablet (40 mg total) by mouth daily. 30 tablet 11   No facility-administered  medications prior to visit.     PHYSICAL EXAM  Filed Vitals:   04/02/14 1525  BP: 114/69  Pulse: 61  Height: 5' 10.75" (1.797 m)  Weight: 204 lb (92.534 kg)   Body mass index is 28.66 kg/(m^2).  Physical Exam  General: well developed, well nourished elderly african American male , seated, in no evident distress  Head: head normocephalic and atraumatic. Orohparynx benign  Neck: supple with no carotid or supraclavicular bruits  Cardiovascular: regular rate and rhythm, no murmurs  Musculoskeletal: no deformity. No back tenderness. Straight leg raising test is negative.  Skin: no rash/petichiae  Vascular: Normal pulses all extremities   Neurologic Exam  Mental Status: Awake and fully alert. Oriented to place and time. Recent and remote memory intact. Attention span, concentration and fund of knowledge appropriate. Mood and affect appropriate.  Cranial Nerves: Fundoscopic  exam not done . Pupils equal, briskly reactive to light. Extraocular movements full without nystagmus. Visual fields full to confrontation. Hearing intact. Facial sensation intact. Face, tongue, palate moves normally and symmetrically.  Motor: Normal bulk and tone. Normal strength in all tested extremity muscles.  Sensory.: intact to tough and pinprick and vibratory.  Coordination: Rapid alternating movements normal in all extremities. Finger-to-nose and heel-to-shin performed accurately bilaterally.  Gait and Station: Arises from chair without difficulty. Stance is normal. Gait demonstrates normal stride length and balance . Able to heel, toe and tandem walk without difficulty.  Reflexes: 1+ and symmetric except right knee jerk and ankle jerk are absent. Toes downgoing.   ASSESSMENT AND PLAN 74 year old male with recurrent episodes of transient right body heaviness, weakness as well as funny sensation in the mouth followed by mild headache possible complicated migraines. Doubt TIAs given the stereotypical nature of the  symptoms.  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 Chronic low back pain with mild degenerative lumbar disc and spine disease.   PLAN:  I had a long discussion with the patient with regards to his migraine headaches which seem like tension headaches as well as prior episodes of transient paresthesias which may represent complicated migraines. He seems to be doing quite well on Topamax 50 mg at night which I asked him to continue. If he remains episode free at next follow-up visit in 6 months may consider tapering and stopping it. Continue aspirin and Plavix for stroke prevention as he has drug-coated cardiac stents. Maintain strict control of hypertension with blood pressure goal below 130/90 and lipids with LDL cholesterol goal below 100 mg percent. Return for follow-up in 6 months or call earlier if necessary.  Antony Contras, MD  04/02/2014, 4:12 PM [facility] [address] [place], [place] [address] [phone]  Note: This document was prepared with digital dictation and possible smart phrase technology. Any transcriptional errors that result from this process are unintentional.

## 2014-04-02 NOTE — Telephone Encounter (Signed)
Pt came in for bloodwork results. These were released to Mychart, copy was printed, and results were discussed w/ patient. He voiced understanding.

## 2014-04-02 NOTE — Patient Instructions (Signed)
I had a long discussion with the patient with regards to his migraine headaches which seem like tension headaches as well as prior episodes of transient paresthesias which may represent complicated migraines. He seems to be doing quite well on Topamax 50 mg at night which I asked him to continue. If he remains episode free at next follow-up visit in 6 months may consider tapering and stopping it. Continue aspirin and Plavix for stroke prevention as he has drug-coated cardiac stents. Maintain strict control of hypertension with blood pressure goal below 130/90 and lipids with LDL cholesterol goal below 100 mg percent. Return for follow-up in 6 months or call earlier if necessary.

## 2014-04-16 ENCOUNTER — Other Ambulatory Visit: Payer: Self-pay | Admitting: Cardiovascular Disease

## 2014-04-16 NOTE — Telephone Encounter (Signed)
Rx(s) sent to pharmacy electronically.  

## 2014-05-23 ENCOUNTER — Other Ambulatory Visit: Payer: Self-pay | Admitting: Neurology

## 2014-07-23 DIAGNOSIS — H25813 Combined forms of age-related cataract, bilateral: Secondary | ICD-10-CM | POA: Diagnosis not present

## 2014-07-23 DIAGNOSIS — H43813 Vitreous degeneration, bilateral: Secondary | ICD-10-CM | POA: Diagnosis not present

## 2014-08-15 ENCOUNTER — Other Ambulatory Visit: Payer: Self-pay | Admitting: Ophthalmology

## 2014-08-15 MED ORDER — TETRACAINE HCL 0.5 % OP SOLN: 1.0000 [drp] | OPHTHALMIC | Status: AC

## 2014-08-15 NOTE — H&P (Signed)
History & Physical:   DATE:   07-23-14  NAME:  Eugene Murray, Eugene Murray      [id number]       HISTORY OF PRESENT ILLNESS: Chief Eye Complaints      patient  states that he gets an itch in his left eye. VA blurry in OU. he has trouble reading material at a distance.  patient  denies having any pain at the moment.    ACTIVE PROBLEMS: Combined forms of age-related cataract, bilateral   ICD10: H25.813  ICD9:   Onset: 07/23/2014 12:20  Initial Date:    Vitreous degeneration, bilateral   ICD10: H43.813  ICD9:   Onset: 07/23/2014 12:13  Initial Date:  SURGERIES: TIA MAY 2014 2 Hernia Sx    1990's 1997 Prostate Ca sx HEART STENT [id number] Pick List - Surgeries  MEDICATIONS: Topiramate: 100 mg tablet SIG-  Dose-  Freq-   Plavix (Clopidogrel):        75 mg tablet  SIG-  1 each   once a day  Metoprolol  (Lopressor):   50 mg tablet  SIG-  1 each    2 times a day   Amlodipine (Norvasc):   10 mg tablet  SIG-  1 each   once a day  Lisinopril (Prinivil, Zestril):   20 mg tablet  SIG-  1 each   once a day  Hydrochlorothiazide (HCTZ):   12.5 mg capsule  SIG-  1 each   once a day  Aspirin:  81 mg tablet  SIG-  1 each   once a day  Nitrostat: Strength-  SIG-   Lipitor (Atorvastatin):   40 mg tablet  SIG-  1 tab(s)   once a day (at bedtime)  Ilevro: 0.3% suspension SIG-  1 drop in OS QD  Ciloxan (Ciprofloxacin) Solution: 0.3% solution SIG-  1 drop in OS BID  REVIEW OF SYSTEMS: ROS:   GEN- Constitutional: HENT: GEN - Endocrine: Reports symptoms of LUNGS/Respiratory:   cpap HEART/Cardiovascular: Reports symptoms of hypertension.   3 stents ABD/Gastrointestinal:  Musculoskeletal (BJE): NEURO/Neurological: PSYCH/Psychiatric:    Is the pt oriented to time, place, person? yes  Mood  normal  _   TOBACCO: Former Smoker. ICD10: Z87.891 ICD9: V15.82   No exposure to tobacco.  SOCIAL HISTORY: Retired.  Married.    [employer]  FAMILY HISTORY: Family History - 1st Degree Relatives:  Son  alive and well.  ALLERGIES: PENICILLIN:  SULFA (e.g. BACTRIM, Sulmatrim, etc.):  Comment-  PHYSICAL EXAMINATION: VS: BMI: 28.0.  BP: 141/70.  H: 70.00 in.  P: 96 /min.  W: 195lbs 0oz.    Va    OD        cc 20/30-1    ph NI      GLARE  OD 20/40                                                                       OS 20/50-1         OS         cc 20/30-2   ph NI  EYEGLASSES: OD   +1.75 +1.50   x 049  OS   +1.25 +0.75 x 046                ADD:    +2.00  MR  07/23/2014 12:40  OD  +1.75 +1.50 X049 20/30  OS  +1.25 +0.75 X046 20/30 ADD +2.50  K'S OD: 41.00      AXIS 173            42.75       AXIS        083 OS:41.50       AXIS 003            42.75      AXIS         093  VF:  Full to confrontation testing   OU  Motility full  PUPILS: 84mm -MG   EYELIDS & OCULAR ADNEXA normal    SLE: Conjunctiva quiet OU  Cornea arcus OU   anterior chamber  deep and quiet  OU  Iris brown  Lens 1  nuclear  sclerosis  OD,  3+cortical spokes OU  SMALL PSC OS  Vitreous posterior vitreous detachment LARGE FLOATER  OS  Ta   in mmHg    OD13           OS 15,16 Time 07/23/2014 11:49    Dilation trop 1%   phenylephrine 2.5%  07/23/2014 11:49   Fundus:  optic nerve  OD    30% cup                                                                  OS 30% cup   Macula      OD              clear  OU   Vessels  normal     Periphery  normal     07/23/2014 11:43  Humphery visual field    normal OU    Exam: GENERAL: Appearance: HEAD, EARS, NOSE AND THROAT: Ears-Nose (external) Inspection: Externally, nose and ears are normal in appearance and without scars, lesions, or nodules.      Hearing assessment shows no problems with normal conversation.      LUNGS and RESPIRATORY: Lung auscultation elicits no wheezing, rhonci, rales or rubs and with equal breath sounds.    Respiratory effort described as breathing is unlabored and chest  movement is symmetrical.    HEART (Cardiovascular): Heart auscultation discovers regular rate and rhythm; no murmur, gallop or rub. Normal heart sounds.    ABDOMEN (Gastrointestinal): Mass/Tenderness Exam: Neither are present.     MUSCULOSKELETAL (BJE): Inspection-Palpation: No major bone, joint, tendon, or muscle changes.      NEUROLOGICAL: Alert and oriented. No major deficits of coordination or sensation.      PSYCHIATRIC: Insight and judgment appear  both to be intact and appropriate.    Mood and affect are described as normal mood and full affect.    SKIN: Skin Inspection: No rashes or lesions  ADMITTING DIAGNOSIS: Combined forms of age-related cataract, bilateral   ICD10: H25.813  ICD9:   Onset: 07/23/2014 12:20  Initial Date:    Vitreous degeneration, bilateral   ICD10: H43.813  ICD9:   Onset: 07/23/2014 12:13  Initial Date:  SURGICAL TREATMENT PLAN: phaco emulsion cataract extraction  intraocular lens implant  OS  Risk and benefits of surgery have been reviewed with the patient and the patient agrees to proceed with the surgical procedure.    Actions:     Handouts: New Handout, What is a cataract?.    ___________________________ Marylynn Pearson, Jr. Variants of migraine with intractable migraine so stated   ICD10:   ICD9: 346.21   sleep apnea Starter - Inactive Problems:

## 2014-08-20 ENCOUNTER — Encounter (HOSPITAL_COMMUNITY): Payer: Self-pay | Admitting: *Deleted

## 2014-08-21 ENCOUNTER — Encounter (HOSPITAL_COMMUNITY): Payer: Self-pay | Admitting: *Deleted

## 2014-08-21 ENCOUNTER — Ambulatory Visit (HOSPITAL_COMMUNITY): Payer: Medicare Other | Admitting: Critical Care Medicine

## 2014-08-21 ENCOUNTER — Ambulatory Visit (HOSPITAL_COMMUNITY)
Admission: RE | Admit: 2014-08-21 | Discharge: 2014-08-21 | Disposition: A | Discharge status: Home / Self Care | Payer: Medicare Other | Source: Ambulatory Visit | Attending: Ophthalmology | Admitting: Ophthalmology

## 2014-08-21 ENCOUNTER — Encounter (HOSPITAL_COMMUNITY)
Admission: RE | Disposition: A | Discharge status: Home / Self Care | Payer: Self-pay | Source: Ambulatory Visit | Attending: Ophthalmology

## 2014-08-21 DIAGNOSIS — I10 Essential (primary) hypertension: Secondary | ICD-10-CM | POA: Diagnosis not present

## 2014-08-21 DIAGNOSIS — H269 Unspecified cataract: Secondary | ICD-10-CM | POA: Diagnosis not present

## 2014-08-21 DIAGNOSIS — H25812 Combined forms of age-related cataract, left eye: Secondary | ICD-10-CM | POA: Diagnosis not present

## 2014-08-21 DIAGNOSIS — I2510 Atherosclerotic heart disease of native coronary artery without angina pectoris: Secondary | ICD-10-CM | POA: Diagnosis not present

## 2014-08-21 DIAGNOSIS — Z87891 Personal history of nicotine dependence: Secondary | ICD-10-CM | POA: Diagnosis not present

## 2014-08-21 DIAGNOSIS — H25813 Combined forms of age-related cataract, bilateral: Secondary | ICD-10-CM | POA: Diagnosis not present

## 2014-08-21 HISTORY — DX: Cerebral infarction, unspecified: I63.9

## 2014-08-21 HISTORY — DX: Unspecified nephritic syndrome with unspecified morphologic changes: N05.9

## 2014-08-21 HISTORY — DX: Malignant (primary) neoplasm, unspecified: C80.1

## 2014-08-21 HISTORY — PX: CATARACT EXTRACTION W/PHACO: SHX586

## 2014-08-21 LAB — CBC
HCT: 42.3 % (ref 39.0–52.0)
Hemoglobin: 15 g/dL (ref 13.0–17.0)
MCH: 30.2 pg (ref 26.0–34.0)
MCHC: 35.5 g/dL (ref 30.0–36.0)
MCV: 85.3 fL (ref 78.0–100.0)
Platelets: 145 10*3/uL — ABNORMAL LOW (ref 150–400)
RBC: 4.96 MIL/uL (ref 4.22–5.81)
RDW: 13.6 % (ref 11.5–15.5)
WBC: 5.8 10*3/uL (ref 4.0–10.5)

## 2014-08-21 LAB — BASIC METABOLIC PANEL
Anion gap: 8 (ref 5–15)
BUN: 11 mg/dL (ref 6–20)
CHLORIDE: 107 mmol/L (ref 101–111)
CO2: 26 mmol/L (ref 22–32)
Calcium: 9 mg/dL (ref 8.9–10.3)
Creatinine, Ser: 0.92 mg/dL (ref 0.61–1.24)
GFR calc non Af Amer: >60 mL/min (ref >60)
Glucose, Bld: 92 mg/dL (ref 65–99)
Potassium: 3.5 mmol/L (ref 3.5–5.1)
SODIUM: 141 mmol/L (ref 135–145)

## 2014-08-21 SURGERY — PHACOEMULSIFICATION, CATARACT, WITH IOL INSERTION
Anesthesia: Monitor Anesthesia Care | Site: Eye | Laterality: Left

## 2014-08-21 MED ORDER — LIDOCAINE-EPINEPHRINE 2 %-1:100000 IJ SOLN
INTRAMUSCULAR | Status: DC | PRN
  Administered 2014-08-21: 6 mL via RETROBULBAR

## 2014-08-21 MED ORDER — GATIFLOXACIN 0.5 % OP SOLN
1.0000 [drp] | OPHTHALMIC | Status: AC | PRN
  Administered 2014-08-21 (×3): 1 [drp] via OPHTHALMIC
  Filled 2014-08-21: qty 2.5

## 2014-08-21 MED ORDER — LIDOCAINE HCL (CARDIAC) 20 MG/ML IV SOLN
INTRAVENOUS | Status: AC
  Filled 2014-08-21: qty 5

## 2014-08-21 MED ORDER — MIDAZOLAM HCL 2 MG/2ML IJ SOLN
INTRAMUSCULAR | Status: AC
  Filled 2014-08-21: qty 2

## 2014-08-21 MED ORDER — PROPOFOL 10 MG/ML IV BOLUS
INTRAVENOUS | Status: DC | PRN
  Administered 2014-08-21 (×2): 30 mg via INTRAVENOUS

## 2014-08-21 MED ORDER — FENTANYL CITRATE (PF) 250 MCG/5ML IJ SOLN
INTRAMUSCULAR | Status: AC
  Filled 2014-08-21: qty 5

## 2014-08-21 MED ORDER — ONDANSETRON HCL 4 MG/2ML IJ SOLN
INTRAMUSCULAR | Status: DC | PRN
  Administered 2014-08-21: 4 mg via INTRAVENOUS

## 2014-08-21 MED ORDER — BSS IO SOLN
INTRAOCULAR | Status: DC | PRN
  Administered 2014-08-21: 15 mL

## 2014-08-21 MED ORDER — SODIUM CHLORIDE 0.9 % IV SOLN
Freq: Once | INTRAVENOUS | Status: AC
  Administered 2014-08-21: 09:00:00 via INTRAVENOUS

## 2014-08-21 MED ORDER — KETOROLAC TROMETHAMINE 0.5 % OP SOLN
1.0000 [drp] | OPHTHALMIC | Status: AC
  Administered 2014-08-21 (×3): 1 [drp] via OPHTHALMIC
  Filled 2014-08-21: qty 5

## 2014-08-21 MED ORDER — TOBRAMYCIN 0.3 % OP OINT
TOPICAL_OINTMENT | OPHTHALMIC | Status: DC | PRN
Start: 2014-08-21 — End: 2014-08-21
  Administered 2014-08-21: 1 via OPHTHALMIC

## 2014-08-21 MED ORDER — NA CHONDROIT SULF-NA HYALURON 40-30 MG/ML IO SOLN
INTRAOCULAR | Status: AC
Start: 2014-08-21 — End: 2014-08-21
  Filled 2014-08-21: qty 0.5

## 2014-08-21 MED ORDER — TROPICAMIDE 1 % OP SOLN
1.0000 [drp] | OPHTHALMIC | Status: AC
  Administered 2014-08-21 (×3): 1 [drp] via OPHTHALMIC
  Filled 2014-08-21: qty 3

## 2014-08-21 MED ORDER — PROPOFOL 10 MG/ML IV BOLUS
INTRAVENOUS | Status: AC
  Filled 2014-08-21: qty 20

## 2014-08-21 MED ORDER — NA CHONDROIT SULF-NA HYALURON 40-30 MG/ML IO SOLN
INTRAOCULAR | Status: DC | PRN
  Administered 2014-08-21 (×2): 0.5 mL via INTRAOCULAR

## 2014-08-21 MED ORDER — PHENYLEPHRINE HCL 2.5 % OP SOLN
1.0000 [drp] | OPHTHALMIC | Status: AC
  Administered 2014-08-21 (×3): 1 [drp] via OPHTHALMIC
  Filled 2014-08-21: qty 2

## 2014-08-21 MED ORDER — 0.9 % SODIUM CHLORIDE (POUR BTL) OPTIME
TOPICAL | Status: DC | PRN
Start: 2014-08-21 — End: 2014-08-21
  Administered 2014-08-21: 100 mL

## 2014-08-21 MED ORDER — MIDAZOLAM HCL 5 MG/5ML IJ SOLN
INTRAMUSCULAR | Status: DC | PRN
  Administered 2014-08-21 (×2): 1 mg via INTRAVENOUS

## 2014-08-21 MED ORDER — VANCOMYCIN HCL IN DEXTROSE 1-5 GM/200ML-% IV SOLN
INTRAVENOUS | Status: AC
Start: 2014-08-21 — End: 2014-08-21
  Filled 2014-08-21: qty 200

## 2014-08-21 MED ORDER — EPINEPHRINE HCL 1 MG/ML IJ SOLN
INTRAMUSCULAR | Status: DC | PRN
  Administered 2014-08-21: 11:00:00

## 2014-08-21 MED ORDER — SODIUM CHLORIDE 0.9 % IV SOLN
INTRAVENOUS | Status: DC | PRN
  Administered 2014-08-21 (×2): via INTRAVENOUS

## 2014-08-21 MED ORDER — BSS IO SOLN
INTRAOCULAR | Status: AC
  Filled 2014-08-21: qty 500

## 2014-08-21 MED ORDER — SODIUM HYALURONATE 10 MG/ML IO SOLN
INTRAOCULAR | Status: DC | PRN
  Administered 2014-08-21: 0.85 mL via INTRAOCULAR

## 2014-08-21 MED ORDER — TETRACAINE HCL 0.5 % OP SOLN
OPHTHALMIC | Status: DC | PRN
  Administered 2014-08-21: 1 [drp] via OPHTHALMIC

## 2014-08-21 MED ORDER — CYCLOPENTOLATE HCL 1 % OP SOLN
1.0000 [drp] | OPHTHALMIC | Status: AC
  Administered 2014-08-21 (×3): 1 [drp] via OPHTHALMIC
  Filled 2014-08-21: qty 2

## 2014-08-21 MED ORDER — VANCOMYCIN HCL 1000 MG IV SOLR
1000.0000 mg | INTRAVENOUS | Status: DC | PRN
  Administered 2014-08-21: 1000 mg via INTRAVENOUS

## 2014-08-21 MED ORDER — ACETYLCHOLINE CHLORIDE 20 MG IO SOLR
INTRAOCULAR | Status: DC | PRN
Start: 2014-08-21 — End: 2014-08-21
  Administered 2014-08-21: 20 mg via INTRAOCULAR

## 2014-08-21 SURGICAL SUPPLY — 36 items
APL SRG 3 HI ABS STRL LF PLS (MISCELLANEOUS) ×1
APPLICATOR COTTON TIP 6IN STRL (MISCELLANEOUS) ×2 IMPLANT
APPLICATOR DR MATTHEWS STRL (MISCELLANEOUS) ×2 IMPLANT
BLADE KERATOME 2.75 (BLADE) ×2 IMPLANT
CANNULA ANTERIOR CHAMBER 27GA (MISCELLANEOUS) ×2 IMPLANT
CORDS BIPOLAR (ELECTRODE) IMPLANT
COVER MAYO STAND STRL (DRAPES) ×2 IMPLANT
DRAPE OPHTHALMIC 40X48 W POUCH (DRAPES) ×2 IMPLANT
DRAPE RETRACTOR (MISCELLANEOUS) ×2 IMPLANT
GLOVE BIO SURGEON STRL SZ7 (GLOVE) ×1 IMPLANT
GLOVE BIO SURGEON STRL SZ8 (GLOVE) ×3 IMPLANT
GLOVE SURG SS PI 7.0 STRL IVOR (GLOVE) ×1 IMPLANT
GOWN STRL REUS W/ TWL LRG LVL3 (GOWN DISPOSABLE) ×2 IMPLANT
GOWN STRL REUS W/TWL LRG LVL3 (GOWN DISPOSABLE) ×4
KIT BASIN OR (CUSTOM PROCEDURE TRAY) ×2 IMPLANT
KIT ROOM TURNOVER OR (KITS) ×2 IMPLANT
LENS INTRAOCULAR MA50BM 20.0 (Intraocular Lens) ×1 IMPLANT
NDL 18GX1X1/2 (RX/OR ONLY) (NEEDLE) ×1 IMPLANT
NDL 25GX 5/8IN NON SAFETY (NEEDLE) ×1 IMPLANT
NDL FILTER BLUNT 18X1 1/2 (NEEDLE) ×1 IMPLANT
NEEDLE 18GX1X1/2 (RX/OR ONLY) (NEEDLE) ×2 IMPLANT
NEEDLE 25GX 5/8IN NON SAFETY (NEEDLE) ×2 IMPLANT
NEEDLE FILTER BLUNT 18X 1/2SAF (NEEDLE) ×1
NEEDLE FILTER BLUNT 18X1 1/2 (NEEDLE) ×1 IMPLANT
NS IRRIG 1000ML POUR BTL (IV SOLUTION) ×2 IMPLANT
PACK CATARACT CUSTOM (CUSTOM PROCEDURE TRAY) ×2 IMPLANT
PAD ARMBOARD 7.5X6 YLW CONV (MISCELLANEOUS) ×2 IMPLANT
PAK PIK CVS CATARACT (OPHTHALMIC) ×2 IMPLANT
PROBE ANTERIOR VITRECTOR (OPHTHALMIC) ×1 IMPLANT
SUT ETHILON 10 0 CS140 6 (SUTURE) ×1 IMPLANT
SUT SILK 6 0 G 6 (SUTURE) IMPLANT
SYR TB 1ML LUER SLIP (SYRINGE) ×2 IMPLANT
TIP ABS 45DEG FLARED 0.9MM (TIP) ×2 IMPLANT
TOWEL OR 17X26 10 PK STRL BLUE (TOWEL DISPOSABLE) ×2 IMPLANT
WATER STERILE IRR 1000ML POUR (IV SOLUTION) ×2 IMPLANT
WIPE INSTRUMENT VISIWIPE 73X73 (MISCELLANEOUS) ×2 IMPLANT

## 2014-08-21 NOTE — Anesthesia Procedure Notes (Signed)
Procedure Name: MAC Date/Time: 08/21/2014 10:27 AM Performed by: Merrilyn Puma B Pre-anesthesia Checklist: Patient identified, Timeout performed, Emergency Drugs available, Suction available and Patient being monitored Patient Re-evaluated:Patient Re-evaluated prior to inductionOxygen Delivery Method: Nasal cannula Intubation Type: IV induction Placement Confirmation: positive ETCO2 and breath sounds checked- equal and bilateral Dental Injury: Teeth and Oropharynx as per pre-operative assessment

## 2014-08-21 NOTE — H&P (View-Only) (Signed)
History & Physical:   DATE:   07-23-14  NAME:  Eugene Murray, Eugene Murray      [id number]       HISTORY OF PRESENT ILLNESS: Chief Eye Complaints      patient  states that he gets an itch in his left eye. VA blurry in OU. he has trouble reading material at a distance.  patient  denies having any pain at the moment.    ACTIVE PROBLEMS: Combined forms of age-related cataract, bilateral   ICD10: H25.813  ICD9:   Onset: 07/23/2014 12:20  Initial Date:    Vitreous degeneration, bilateral   ICD10: H43.813  ICD9:   Onset: 07/23/2014 12:13  Initial Date:  SURGERIES: TIA MAY 2014 2 Hernia Sx    1990's 1997 Prostate Ca sx HEART STENT [id number] Pick List - Surgeries  MEDICATIONS: Topiramate: 100 mg tablet SIG-  Dose-  Freq-   Plavix (Clopidogrel):        75 mg tablet  SIG-  1 each   once a day  Metoprolol  (Lopressor):   50 mg tablet  SIG-  1 each    2 times a day   Amlodipine (Norvasc):   10 mg tablet  SIG-  1 each   once a day  Lisinopril (Prinivil, Zestril):   20 mg tablet  SIG-  1 each   once a day  Hydrochlorothiazide (HCTZ):   12.5 mg capsule  SIG-  1 each   once a day  Aspirin:  81 mg tablet  SIG-  1 each   once a day  Nitrostat: Strength-  SIG-   Lipitor (Atorvastatin):   40 mg tablet  SIG-  1 tab(s)   once a day (at bedtime)  Ilevro: 0.3% suspension SIG-  1 drop in OS QD  Ciloxan (Ciprofloxacin) Solution: 0.3% solution SIG-  1 drop in OS BID  REVIEW OF SYSTEMS: ROS:   GEN- Constitutional: HENT: GEN - Endocrine: Reports symptoms of LUNGS/Respiratory:   cpap HEART/Cardiovascular: Reports symptoms of hypertension.   3 stents ABD/Gastrointestinal:  Musculoskeletal (BJE): NEURO/Neurological: PSYCH/Psychiatric:    Is the pt oriented to time, place, person? yes  Mood  normal  _   TOBACCO: Former Smoker. ICD10: Z87.891 ICD9: V15.82   No exposure to tobacco.  SOCIAL HISTORY: Retired.  Married.    [employer]  FAMILY HISTORY: Family History - 1st Degree Relatives:  Son  alive and well.  ALLERGIES: PENICILLIN:  SULFA (e.g. BACTRIM, Sulmatrim, etc.):  Comment-  PHYSICAL EXAMINATION: VS: BMI: 28.0.  BP: 141/70.  H: 70.00 in.  P: 96 /min.  W: 195lbs 0oz.    Va    OD        cc 20/30-1    ph NI      GLARE  OD 20/40                                                                       OS 20/50-1         OS         cc 20/30-2   ph NI  EYEGLASSES: OD   +1.75 +1.50   x 049  OS   +1.25 +0.75 x 046                ADD:    +2.00  MR  07/23/2014 12:40  OD  +1.75 +1.50 X049 20/30  OS  +1.25 +0.75 X046 20/30 ADD +2.50  K'S OD: 41.00      AXIS 173            42.75       AXIS        083 OS:41.50       AXIS 003            42.75      AXIS         093  VF:  Full to confrontation testing   OU  Motility full  PUPILS: 24mm -MG   EYELIDS & OCULAR ADNEXA normal    SLE: Conjunctiva quiet OU  Cornea arcus OU   anterior chamber  deep and quiet  OU  Iris brown  Lens 1  nuclear  sclerosis  OD,  3+cortical spokes OU  SMALL PSC OS  Vitreous posterior vitreous detachment LARGE FLOATER  OS  Ta   in mmHg    OD13           OS 15,16 Time 07/23/2014 11:49    Dilation trop 1%   phenylephrine 2.5%  07/23/2014 11:49   Fundus:  optic nerve  OD    30% cup                                                                  OS 30% cup   Macula      OD              clear  OU   Vessels  normal     Periphery  normal     07/23/2014 11:43  Humphery visual field    normal OU    Exam: GENERAL: Appearance: HEAD, EARS, NOSE AND THROAT: Ears-Nose (external) Inspection: Externally, nose and ears are normal in appearance and without scars, lesions, or nodules.      Hearing assessment shows no problems with normal conversation.      LUNGS and RESPIRATORY: Lung auscultation elicits no wheezing, rhonci, rales or rubs and with equal breath sounds.    Respiratory effort described as breathing is unlabored and chest  movement is symmetrical.    HEART (Cardiovascular): Heart auscultation discovers regular rate and rhythm; no murmur, gallop or rub. Normal heart sounds.    ABDOMEN (Gastrointestinal): Mass/Tenderness Exam: Neither are present.     MUSCULOSKELETAL (BJE): Inspection-Palpation: No major bone, joint, tendon, or muscle changes.      NEUROLOGICAL: Alert and oriented. No major deficits of coordination or sensation.      PSYCHIATRIC: Insight and judgment appear  both to be intact and appropriate.    Mood and affect are described as normal mood and full affect.    SKIN: Skin Inspection: No rashes or lesions  ADMITTING DIAGNOSIS: Combined forms of age-related cataract, bilateral   ICD10: H25.813  ICD9:   Onset: 07/23/2014 12:20  Initial Date:    Vitreous degeneration, bilateral   ICD10: H43.813  ICD9:   Onset: 07/23/2014 12:13  Initial Date:  SURGICAL TREATMENT PLAN: phaco emulsion cataract extraction  intraocular lens implant  OS  Risk and benefits of surgery have been reviewed with the patient and the patient agrees to proceed with the surgical procedure.    Actions:     Handouts: New Handout, What is a cataract?.    ___________________________ Marylynn Pearson, Jr. Variants of migraine with intractable migraine so stated   ICD10:   ICD9: 346.21   sleep apnea Starter - Inactive Problems:

## 2014-08-21 NOTE — Op Note (Signed)
Preoperative diagnosis: Visually significant cataract left eye Postop diagnosis: Same Procedure: Phacoemulsification with intraocular lens implant with anterior vitrectomy left eye Complications: Posterior capsule rupture Anesthesia: 2% Xylocaine with epinephrine in a 5050 mixture 0.75% Marcaine with an ampule of [place] Asst.: Mindy Procedure: The patient is transported to the operating room where he was given a peribulbar block with the aforementioned local and aesthetic agent. Pressure was applied to the eye the patient's face was prepped and draped in the usual sterile fashion. With the surgeon sitting temporally the operating microscope in position a Weck-Cel sponges used to fixate the globe and a 15 blade was used to enter through inferior clear cornea Viscoat was injected in the anterior chamber. Using an additional Weck-Cel sponge a 2.75 mm keratome blade was used in a stepwise fashion through temporal clear cornea to into the chamber. A bent 25-gauge needle was used to incise the anterior capsule and a continuous tear curvilinear capsulorrhexis was formed the Utrata forceps were used to remove the anterior capsule. BSS was used to hydrodissect the hydrodelineate the nucleus. Nucleus was sculpted centrally and the epinucleus was removed the snapper hook was used to separate the nucleus into quadrants at this point the nucleus was noted to float up superiorly therefore all nuclear fragments were aspirated into the phacoemulsification tip. At this point it was noted that the posterior capsule was opened and Viscoat was injected in the anterior chamber to deepen the chamber. Viscoat was also used to reexpand the capsular bag the irrigation-aspiration device was then used to remove areas of subincisional cortex. After withdrawn the phacotip strand of vitreous was noted to the incision therefore an anterior vitrectomy was performed until all vitreous had been removed from the anterior chamber and the  cortical debris had also been removed from the eye. The capsulorrhexis remained intact therefore the intraocular lens implant was chosen for sulcus implantation the lens was an Alcon MA 50 BM 20.0 dpt lens SN number [id number] the lens is placed in the lens injector the lens was injected in the anterior chamber and rotated to position the haptic in the ciliary sulcus the trailing haptic was slightly distorted using the angle Kelman forceps the haptic was repositioned then straightened and then the haptic was inserted in the ciliary sulcus the lens centered very well in good position. The eye was pressurized and a 10-0 nylon suture was placed to close the wound there being no leakage all instruments were removed from the eye topical Tobradex ointment was applied to the eye and the patient was given 1 g of vancomycin. A patch and Fox U were placed and the patient returned to recovery area in stable condition. Marylynn Pearson Junior M.D.

## 2014-08-21 NOTE — Anesthesia Postprocedure Evaluation (Signed)
  Anesthesia Post-op Note  Patient: Eugene Murray  Procedure(s) Performed: Procedure(s): PHACO EMULSION CATARACT EXTRACTION AND INTRAOCULAR LENS PLACEMENT (IOC) IMPLANT LEFT EYE (Left)  Patient Location: PACU  Anesthesia Type: MAC  Level of Consciousness: awake and alert   Airway and Oxygen Therapy: Patient Spontanous Breathing  Post-op Pain: none  Post-op Assessment: Post-op Vital signs reviewed, Patient's Cardiovascular Status Stable and Respiratory Function Stable  Post-op Vital Signs: Reviewed  Filed Vitals:   08/21/14 1211  BP:   Pulse: 47  Temp:   Resp:     Complications: No apparent anesthesia complications

## 2014-08-21 NOTE — Anesthesia Preprocedure Evaluation (Addendum)
Anesthesia Evaluation  Patient identified by MRN, date of birth, ID band Patient awake    Reviewed: Allergy & Precautions, H&P , NPO status , Patient's Chart, lab work & pertinent test results, reviewed documented beta blocker date and time   Airway Mallampati: II  TM Distance: >3 FB Neck ROM: Full    Dental no notable dental hx. (+) Dental Advisory Given, Teeth Intact   Pulmonary sleep apnea and Continuous Positive Airway Pressure Ventilation , former smoker,  breath sounds clear to auscultation  Pulmonary exam normal       Cardiovascular hypertension, Pt. on medications and Pt. on home beta blockers + CAD and + Peripheral Vascular Disease Rhythm:Regular Rate:Normal     Neuro/Psych  Headaches, TIAnegative psych ROS   GI/Hepatic negative GI ROS, Neg liver ROS,   Endo/Other  negative endocrine ROS  Renal/GU negative Renal ROS  negative genitourinary   Musculoskeletal   Abdominal   Peds  Hematology negative hematology ROS (+)   Anesthesia Other Findings   Reproductive/Obstetrics negative OB ROS                           Anesthesia Physical Anesthesia Plan  ASA: III  Anesthesia Plan: MAC   Post-op Pain Management:    Induction: Intravenous  Airway Management Planned: Nasal Cannula  Additional Equipment:   Intra-op Plan:   Post-operative Plan:   Informed Consent: I have reviewed the patients History and Physical, chart, labs and discussed the procedure including the risks, benefits and alternatives for the proposed anesthesia with the patient or authorized representative who has indicated his/her understanding and acceptance.   Dental advisory given  Plan Discussed with: Anesthesiologist and Surgeon  Anesthesia Plan Comments:         Anesthesia Quick Evaluation

## 2014-08-21 NOTE — Discharge Instructions (Signed)
The patient may remove the eye patch today at 3:30 PM and instilled the eyedrops that were given to him at the office he should avoid rubbing his eye he should sleep the eye shield the plastic shield over his eye. Sleep on his back or right side, avoid heavy lifting bending or straining.

## 2014-08-21 NOTE — Transfer of Care (Signed)
Immediate Anesthesia Transfer of Care Note  Patient: Eugene Murray  Procedure(s) Performed: Procedure(s): PHACO EMULSION CATARACT EXTRACTION AND INTRAOCULAR LENS PLACEMENT (IOC) IMPLANT LEFT EYE (Left)  Patient Location: PACU  Anesthesia Type:MAC  Level of Consciousness: awake, alert  and oriented  Airway & Oxygen Therapy: Patient Spontanous Breathing  Post-op Assessment: Report given to RN, Post -op Vital signs reviewed and stable and Patient moving all extremities X 4  Post vital signs: Reviewed and stable  Last Vitals:  Filed Vitals:   08/21/14 1145  BP: 126/67  Pulse: 54  Temp: 36.5 C  Resp:     Complications: No apparent anesthesia complications

## 2014-08-21 NOTE — Interval H&P Note (Signed)
History and Physical Interval Note:  08/21/2014 9:57 AM  Eugene Murray  has presented today for surgery, with the diagnosis of COMBINED FORM OF AGE RELATED CATARACT LEFT EYE  The various methods of treatment have been discussed with the patient and family. After consideration of risks, benefits and other options for treatment, the patient has consented to  Procedure(s): PHACO EMULSION CATARACT EXTRACTION AND INTRAOCULAR LENS PLACEMENT (IOC) IMPLANT LEFT EYE (Left) as a surgical intervention .  The patient's history has been reviewed, patient examined, no change in status, stable for surgery.  I have reviewed the patient's chart and labs.  Questions were answered to the patient's satisfaction.     Eugene Murray

## 2014-08-22 ENCOUNTER — Encounter (HOSPITAL_COMMUNITY): Payer: Self-pay | Admitting: Ophthalmology

## 2014-08-26 ENCOUNTER — Other Ambulatory Visit: Payer: Self-pay

## 2014-10-01 ENCOUNTER — Encounter: Payer: Self-pay | Admitting: Neurology

## 2014-10-01 ENCOUNTER — Ambulatory Visit (INDEPENDENT_AMBULATORY_CARE_PROVIDER_SITE_OTHER): Payer: Medicare Other | Admitting: Neurology

## 2014-10-01 VITALS — BP 113/73 | HR 56 | Ht 70.0 in | Wt 200.2 lb

## 2014-10-01 DIAGNOSIS — G441 Vascular headache, not elsewhere classified: Secondary | ICD-10-CM | POA: Diagnosis not present

## 2014-10-01 DIAGNOSIS — I2510 Atherosclerotic heart disease of native coronary artery without angina pectoris: Secondary | ICD-10-CM

## 2014-10-01 MED ORDER — TOPIRAMATE 50 MG PO TABS: 50.0000 mg | ORAL_TABLET | Freq: Two times a day (BID) | ORAL | Status: DC

## 2014-10-01 NOTE — Patient Instructions (Signed)
I had a long discussion with the patient with regards to his episodes of migraine variants which seem to be stable but he is now having new onset of daily frontal headaches which seem like tension headaches. I recommend he increase Topamax to 50 mg twice daily. I have discussed side effects of Topamax and advised him to call me if needed. Continue aspirin and Plavix for stroke prevention as well as strict control of hypertension with blood pressure goal below 130/90, lipids with LDL cholesterol goal below 70 mg percent. Greater than 50% of time during this 25 minute visit was spent on counseling and coordination of care. Return for follow-up in 6 months or call earlier if necessary.

## 2014-10-01 NOTE — Progress Notes (Signed)
PATIENT: Eugene Murray DOB: [birth year]  REASON FOR VISIT: follow up HISTORY FROM: patient  HISTORY OF PRESENT ILLNESS: Eugene Murray is a 74 year old AA male who been having recurrent stereotypical episodes of right-sided weakness, heaviness and a strange feeling on the roof of the mouth on the right. These have been occurring for the last several weeks But have increased in last 1 week to 2-3 times a day He was admitted to [facility] on 06/30/12 with similar symptoms which had been ongoing off and on for one week. He had noticed some difficulty speaking as well. The symptoms lasted 10-15 minutes with a maximum episode lasting 20 minutes. CT scan was unremarkable. An MRI scan of the brain showed a equivocal tiny acute left pontomedullary junction infarct. MRA of the brain showed mild intracranial atherosclerotic changes. 2DEcho showed normal ejection fraction. Carotid Doppler showed no significant extracranial stenosis. Patient was brought to have a brainstem infarct due to small vessel disease and recurrent symptoms and was advised to add aspirin to his Plavix because of prior history of drug coated cardiac stents. Lipid profile was at goal. Patient states that his symptoms have persisted and despite adding aspirin and he's been having these now to 3 times a day. He does feel just symptoms are stereo typical and he gets an aura with a warning which is a strange feeling on the roof of his mouth on the inside of the right side. This is followed by a feeling of heaviness on the right side. He feels tired and needs to rest. This is followed by a mild headache which lasted for for A few hours. He denies known history of chronic migraines but does states that a few years ago he did have couple of episodes of transient visual distortions which were felt to be migraine variant. He does have some intermittent headaches off and on but he didn't feel he needed to go to the emergency room or take strong  medications for them. He does not describe specific triggers for these episodes. He does have history of sleep apnea and does use a CPAP which was last titrated a year ago and he feels it seems to be working fine. He does also complain of chronic low back pain with some recent increase in pain and some intermittent right leg weakness and ankle giving out. He did have epidural steroid injections in the past but and has no history of back surgery. He is not had any recent imaging studies of the back done.   UPDATE 11/15/12 (LL): Eugene Murray returns for follow up of TIA vs. Migraine variant symptoms and right leg weakness. After starting Topamax 50 mg at night, he noticed the right arm, right leg weakness, and the tingling sensation in the roof of his mouth disappeared after about 1 week on the medication. He tried going to twice a day but felt worse cognitively. He went back to once daily at night and is pleased. EEG was normal. MRI of the lumbar spine showed mild degenerative changes and mild central cord stenosis. He is taking Plavix and aspirin daily with no problems. Patient denies medication side effects, with no signs of bleeding or excessive bruising.   UPDATE 05/31/13 (LL):  Eugene Murray returns for follow up of TIA vs. Migraine variant symptoms and right leg weakness. BP is well controlled, it is 128/77 in the office today.  He is tolerating Plavix and aspirin daily (due to drug eluding stents)  with no signs  of bleeding or excessive bruising. He is having some mild headaches when he first gets up in the morning, across the left top side of his head, he thinks it is from his CPAP mask.  Chronic low back pain continues, but he stays active, goes to the gym regularly.  No new neurovascular symptoms. Update 04/02/2014 ; she returns for follow-up after last visit in April 2015. He continues to do well without any episodes of recurrent paresthesias for completed migraine. He does have minor headaches once or twice a  week which are 5/10 in severity. Pressure-like in quality which she can tolerate without medications. There is no accompanying light or sound sensitivity or any clear triggers. He does have sleep apnea and uses CPAP every night regularly. He is currently taking Topamax 50 mg at night and tolerating it well without any side effects. He remains on aspirin and Plavix because of cardiac stents and is tolerating it well with only minor bruising and no significant bleeding episodes. He has no new complaints today.  Update 10/01/2014 : He returns for follow-up after last visit 6 months ago. He has had no further recurrent spells of weakness and numbness but states that his noticed increasing daily headaches for the last 4-5 months. He describes these as being mild mostly 5/10 in severity located over the left frontal and temporal regions. The headache is not a complaint bad nausea vomiting light or sound sensitivity. He had cataract surgery on 07/27/39 without complications and has noticed slight improvement in his vision. He denies any loss of vision, myalgia, jaw claudication or arthralgias. He remains on Topamax 50 mg at night which is tolerating well without side effects. He is also on aspirin and Plavix without significant bruising or bleeding episodes. He had lipid profile checked a few months ago by primary physician and it was fine. He states his blood pressure is good and today it is 113/73. ROS:  14 system review of systems is positive for   , headaches  only and all other systems negative.  ALLERGIES: Allergies  Allergen Reactions  . Other Hives    SEAFOOD  . Penicillins Other (See Comments)    "caused me to pass out"  . Sulfa Antibiotics Hives    HOME MEDICATIONS: Outpatient Prescriptions Prior to Visit  Medication Sig Dispense Refill  . amLODipine (NORVASC) 5 MG tablet Take 1 tablet (5 mg total) by mouth daily. 30 tablet 11  . aspirin 81 MG tablet Take 81 mg by mouth daily.      Marland Kitchen atorvastatin  (LIPITOR) 40 MG tablet Take 40 mg by mouth at bedtime.    . clopidogrel (PLAVIX) 75 MG tablet Take 1 tablet (75 mg total) by mouth daily. 30 tablet 11  . hydrochlorothiazide (HYDRODIURIL) 25 MG tablet Take 1 tablet (25 mg total) by mouth daily. 30 tablet 11  . lisinopril (PRINIVIL,ZESTRIL) 40 MG tablet Take 1 tablet (40 mg total) by mouth daily. 30 tablet 11  . metoprolol (LOPRESSOR) 50 MG tablet Take 1 tablet (50 mg total) by mouth 2 (two) times daily. 60 tablet 11  . nitroGLYCERIN (NITROSTAT) 0.4 MG SL tablet Place 1 tablet (0.4 mg total) under the tongue every 5 (five) minutes as needed for chest pain (<please make appointment>). 25 tablet 0  . topiramate (TOPAMAX) 50 MG tablet TAKE 1 TABLET (50 MG TOTAL) BY MOUTH AT BEDTIME. 90 tablet 1   No facility-administered medications prior to visit.     PHYSICAL EXAM  Filed Vitals:  10/01/14 1006  BP: 113/73  Pulse: 56  Height: 5\' 10"  (5.329 m)  Weight: 200 lb 3.2 oz (90.81 kg)   Body mass index is 28.73 kg/(m^2).  Physical Exam  General: well developed, well nourished elderly african American male , seated, in no evident distress  Head: head normocephalic and atraumatic.   Neck: supple with no carotid or supraclavicular bruits  Cardiovascular: regular rate and rhythm, no murmurs  Musculoskeletal: no deformity. No back tenderness. Straight leg raising test is negative.  Skin: no rash/petichiae  Vascular: Normal pulses all extremities   Neurologic Exam  Mental Status: Awake and fully alert. Oriented to place and time. Recent and remote memory intact. Attention span, concentration and fund of knowledge appropriate. Mood and affect appropriate.  Cranial Nerves: Fundoscopic exam not done . Pupils equal, briskly reactive to light. Extraocular movements full without nystagmus. Visual fields full to confrontation. Hearing intact. Facial sensation intact. Face, tongue, palate moves normally and symmetrically.  Motor: Normal bulk and tone.  Normal strength in all tested extremity muscles.  Sensory.: intact to tough and pinprick and vibratory.  Coordination: Rapid alternating movements normal in all extremities. Finger-to-nose and heel-to-shin performed accurately bilaterally.  Gait and Station: Arises from chair without difficulty. Stance is normal. Gait demonstrates normal stride length and balance . Able to heel, toe and tandem walk with slight difficulty.  Reflexes: 1+ and symmetric except right knee jerk and ankle jerk are absent. Toes downgoing.   ASSESSMENT AND PLAN 74 year old male with recurrent episodes of transient right body heaviness, weakness as well as funny sensation in the mouth followed by mild headache possible complicated migraines. Doubt TIAs given the stereotypical nature of the symptoms.  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  New frontal and temporal headache likely tension headache PLAN:  I had a long discussion with the patient with regards to his episodes of migraine variants which seem to be stable but he is now having new onset of daily frontal headaches which seem like tension headaches. I recommend he increase Topamax to 50 mg twice daily. I have discussed side effects of Topamax and advised him to call me if needed. Continue aspirin and Plavix for stroke prevention as well as strict control of hypertension with blood pressure goal below 130/90, lipids with LDL cholesterol goal below 70 mg percent. Greater than 50% of time during this 25 minute visit was spent on counseling and coordination of care. Return for follow-up in 6 months or call earlier if necessary.   Antony Contras, MD  10/01/2014, 1:15 PM [facility] [address], [place], [place] [address] [phone]  Note: This document was prepared with digital dictation and possible smart phrase  technology. Any transcriptional errors that result from this process are unintentional.

## 2014-12-09 ENCOUNTER — Ambulatory Visit: Payer: Medicare Other | Admitting: Internal Medicine

## 2015-01-19 ENCOUNTER — Other Ambulatory Visit: Payer: Self-pay | Admitting: Cardiovascular Disease

## 2015-01-29 DIAGNOSIS — H43813 Vitreous degeneration, bilateral: Secondary | ICD-10-CM | POA: Diagnosis not present

## 2015-01-29 DIAGNOSIS — H2513 Age-related nuclear cataract, bilateral: Secondary | ICD-10-CM | POA: Diagnosis not present

## 2015-02-10 ENCOUNTER — Encounter: Payer: Self-pay | Admitting: Internal Medicine

## 2015-02-10 ENCOUNTER — Ambulatory Visit: Payer: Medicare Other | Admitting: Internal Medicine

## 2015-02-10 VITALS — BP 116/70 | HR 57 | Ht 70.75 in | Wt 207.8 lb

## 2015-02-10 DIAGNOSIS — Z23 Encounter for immunization: Secondary | ICD-10-CM

## 2015-02-10 DIAGNOSIS — G4733 Obstructive sleep apnea (adult) (pediatric): Secondary | ICD-10-CM

## 2015-02-10 NOTE — Patient Instructions (Signed)
[facility]  [facility], continuing CPAP 14, mask of choice, humidifier,  AirView Dx OSA

## 2015-02-10 NOTE — Progress Notes (Signed)
Subjective:    Patient ID: Eugene Murray, male    DOB: [birth year], 74 y.o.   MRN: [id number]  HPI 09/09/10- 78 yoM former smoker, coming to re-establish for OSA. Last here 05/17/06- note reviewed. NPSG 01/01/05- AHI 42.9/hr. Since then he has continued using CPAP 10 all night, every night ([employer]). Current machine works ok . He has a new full face mask. He feels he sleeps well, with little waking at night, no snore through. He and his wife are concerned that he still seems to fall asleep easily in the day time, especially if he sits. Stops to nap at rest stops on long drives. Seldom caffeine. He dozes off and on and stays up late to channel surf, sometimes 2AM, then up at 6 AM.  Hx CAD w/ stents- Dr Claiborne Billings.   09/09/11- 57 yoM former smoker followed for OSA, complicated by hx HBP, Allergic rhinitis Wears CPAP 10 every night for approximately 6 hours and still gets sleepy during the day-can fall asleep if sitting down. Sleepy only if sitting quietly, and he doesn't mind. Denies snoring through his mask. Admits he often will stay up until 2:00 watching TV and recognizes that his part why he is sleepy. We discussed sleep hygiene and CPAP.  12/05/12- 46 yoM former smoker followed for OSA, complicated by hx HBP, Allergic rhinitis FOLLOWS FOR: Wears CPAP 10/ [employer] every night for about 6 hours; pressure okay for patient.  Wakes in the morning with left temple headache, possibly from strap. This clears quickly. Hospitalized with TIA May 7, history of migraine. Still some daytime sleepiness watching TV. Little caffeine  12/06/13- 72 yoM former smoker followed for OSA, complicated by hx HBP, Allergic rhinitis, hx TIA FOLLOW FOR: Sleep Apnea, uses CPAP every night, P14; mask fits fine; no further complaints CPAP 14/ [employer]  02/10/2015-74 year old male former smoker followed for OSA, complicated by HBP, allergic rhinitis, history TIA CPAP 14/ [employer] FOLLOWS FOR: Will need new DME (due to insurance) other than  [employer]. Wears CPAP every night for about 6 hours. Will need order for supplies as well.    ROS-see HPI Constitutional:   No-   weight loss, night sweats, fevers, chills,+fatigue, lassitude. HEENT:   + headaches, no-difficulty swallowing, tooth/dental problems, sore throat,       No-  sneezing, itching, ear ache, nasal congestion, post nasal drip,  CV:  No-   chest pain, orthopnea, PND, swelling in lower extremities, anasarca, dizziness, palpitations Resp: No-   shortness of breath with exertion or at rest.              No-   productive cough,  No non-productive cough,  No- coughing up of blood.              No-   change in color of mucus.  No- wheezing.   Skin: No-   rash or lesions. GI:  No-   heartburn, indigestion, abdominal pain, nausea, vomiting,  GU:  MS:  No-   joint pain or swelling.   Neuro-     nothing unusual Psych:  No- change in mood or affect. No depression or anxiety.  No memory loss.  Objective:   Physical Exam General- Alert, Oriented, Affect-appropriate, Distress- none acute,  Medium build, laconic affect Skin- rash-none, lesions- none, excoriation- none Lymphadenopathy- none Head- atraumatic            Eyes- Gross vision intact, PERRLA, conjunctivae clear secretions  Ears- Hearing, canals            Nose- Clear, Septal dev, mucus, polyps, erosion, perforation             Throat- Mallampati III , mucosa clear , drainage- none, tonsils- atrophic Neck- flexible , trachea midline, no stridor , thyroid nl, carotid no bruit Chest - symmetrical excursion , unlabored           Heart/CV- RRR , no murmur , no gallop  , no rub, nl s1 s2                           - JVD- none , edema- none, stasis changes- none, varices- none           Lung- clear to P&A, wheeze- none, cough- none , dullness-none, rub- none           Chest wall-  Abd-  Br/ Gen/ Rectal- Not done, not indicated Extrem- cyanosis- none, clubbing, none, atrophy- none, strength- nl Neuro- grossly intact  to observation  Assessment & Plan:

## 2015-02-23 IMAGING — CT CT HEAD W/O CM
1 of 2 series · 13 of 30 positions shown, 17 images · non-contrast
Comparison: None.

CLINICAL DATA: Acute onset right upper and lower extremity
weakness.  Right-sided foot drop.

CT HEAD WITHOUT CONTRAST
TECHNIQUE: Contiguous axial images were obtained from the base of
the skull through the vertex without contrast.

[Series 2: brain · axial · 0.49mm/px · z∈[+107,+261]mm · 13 of 36 slices shown, 17 images]
[im 3/36  brain]
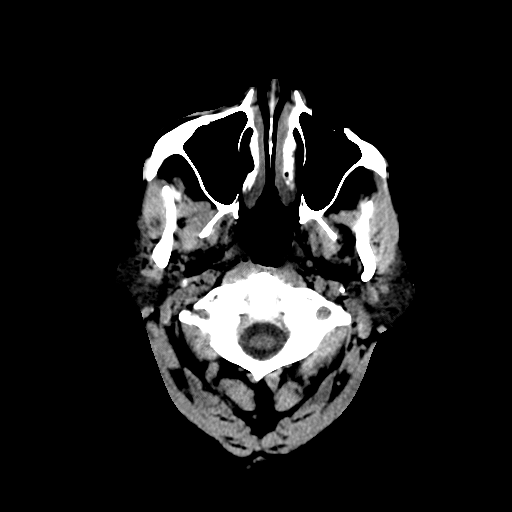
[im 3/36  bone]
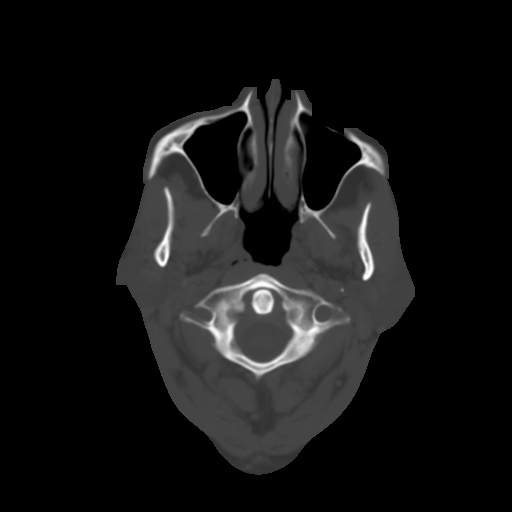
[im 6/36  brain]
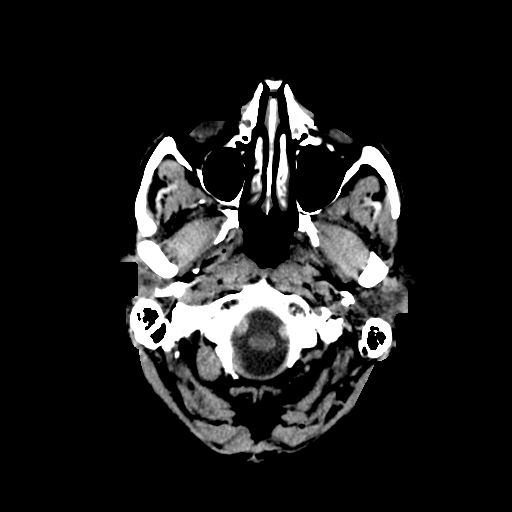
[im 8/36  brain]
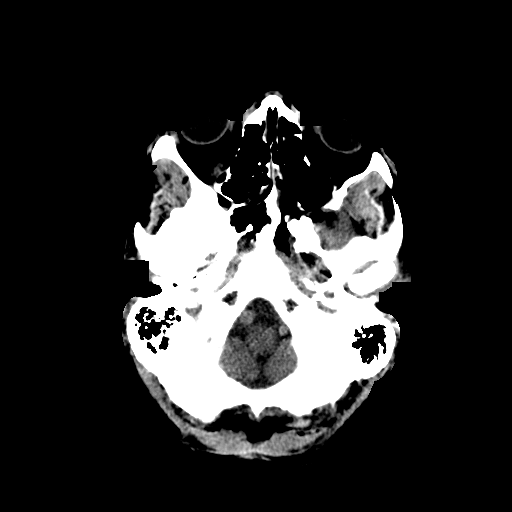
[im 11/36  brain]
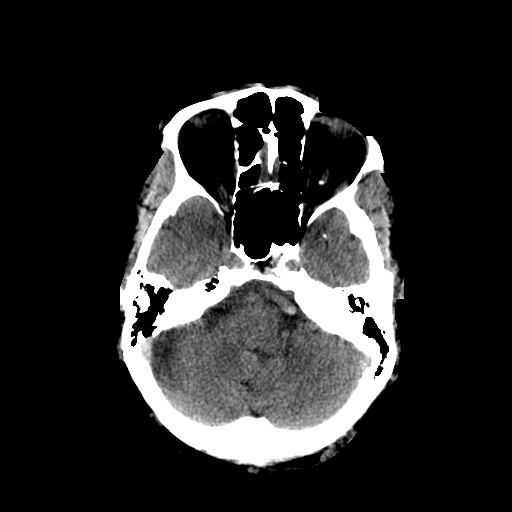
[im 13/36  brain]
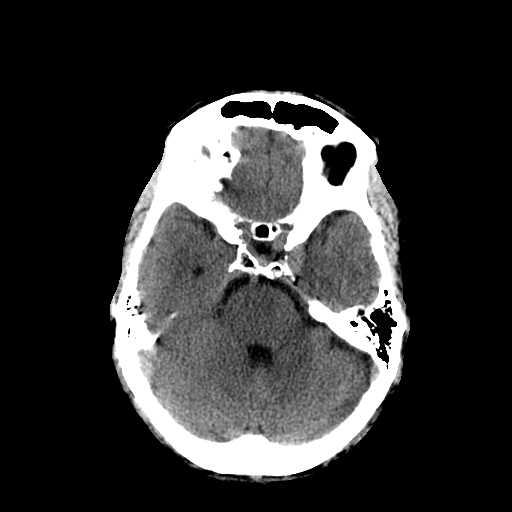
[im 13/36  bone]
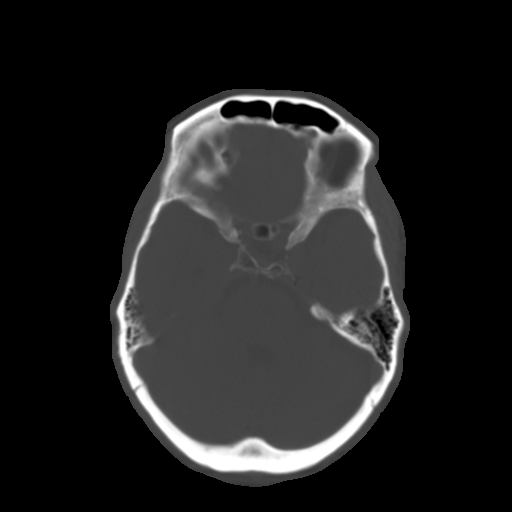
[im 16/36  brain]
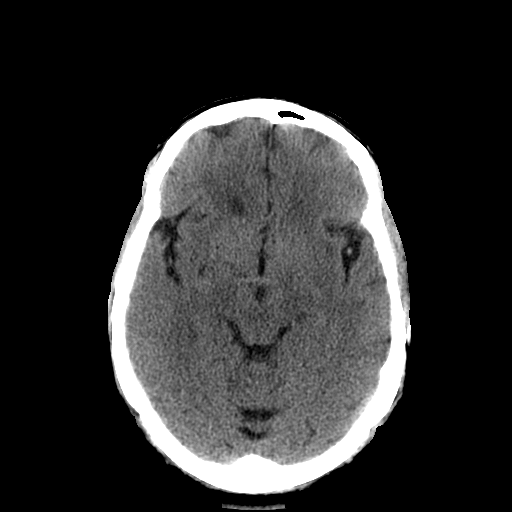
[im 18/36  brain]
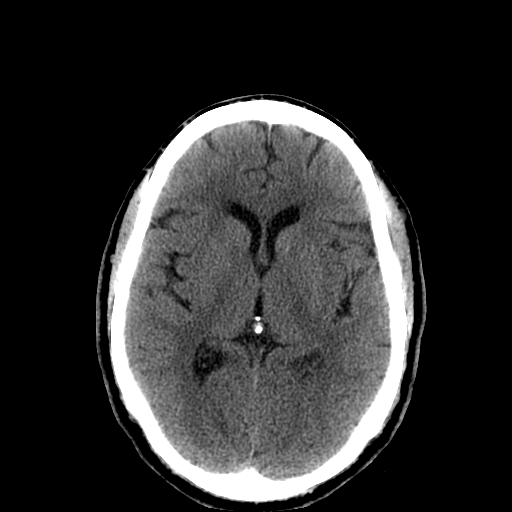
[im 21/36  brain]
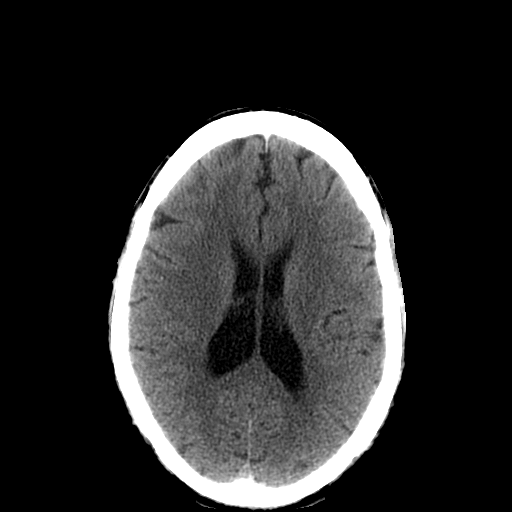
[im 23/36  brain]
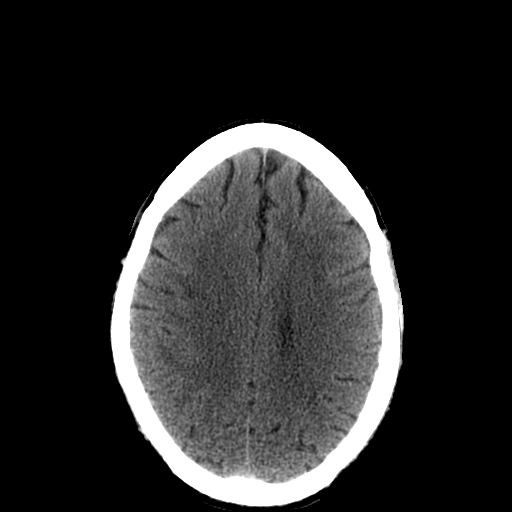
[im 23/36  bone]
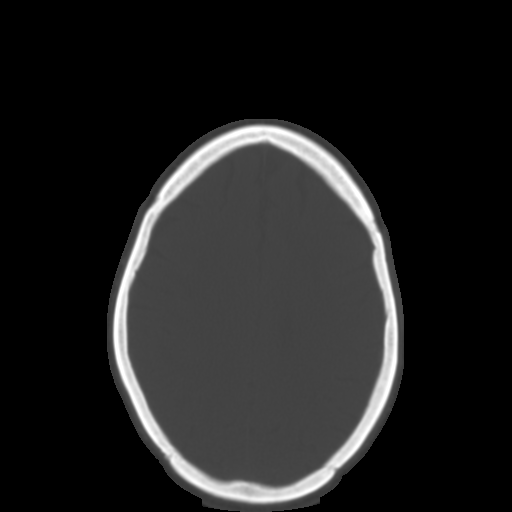
[im 26/36  brain]
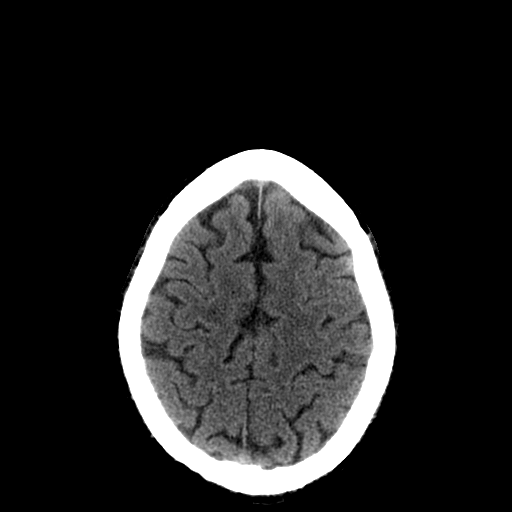
[im 28/36  brain]
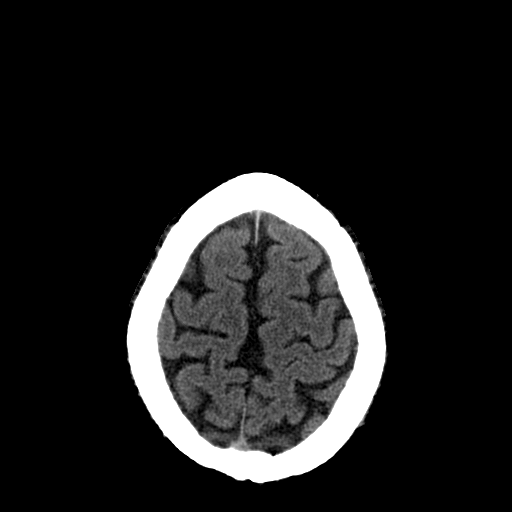
[im 31/36  brain]
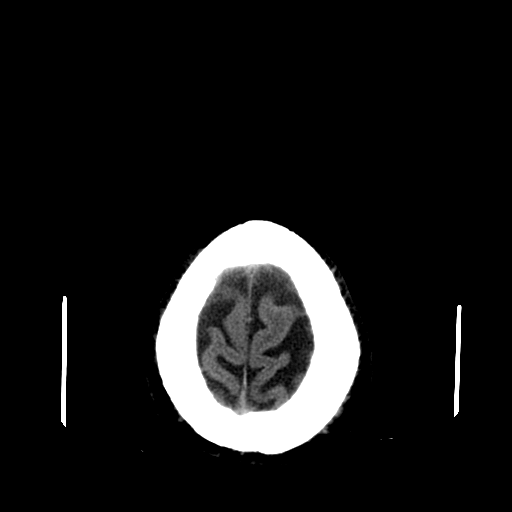
[im 33/36  brain]
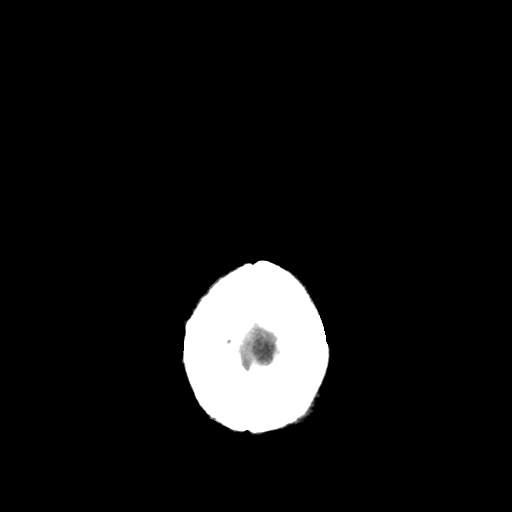
[im 33/36  bone]
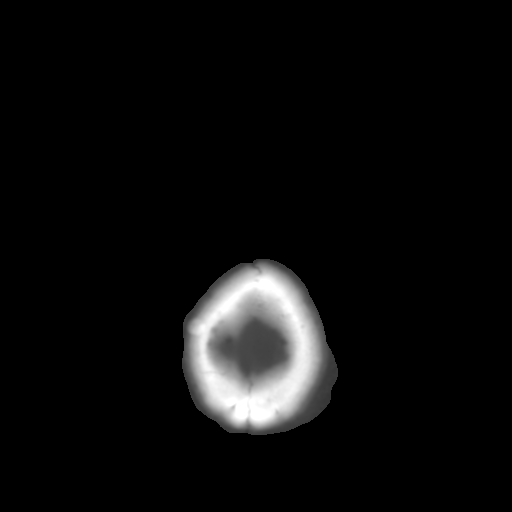

[13 of 30 positions shown; findings below may reference images not displayed]

FINDINGS: No evidence of acute infarct, acute hemorrhage, mass
lesion, mass effect or hydrocephalus.  Minimal periventricular low
attenuation.  Small retention cysts or polyps are seen in the left
maxillary sinus.
IMPRESSION: 1.  No acute findings.
2.  Minimal chronic microvascular white matter ischemic changes.

## 2015-03-19 ENCOUNTER — Emergency Department (HOSPITAL_COMMUNITY)
Admission: EM | Admit: 2015-03-19 | Discharge: 2015-03-19 | Disposition: A | Discharge status: Home / Self Care | Payer: Medicare Other | Attending: Emergency Medicine | Admitting: Emergency Medicine

## 2015-03-19 ENCOUNTER — Encounter (HOSPITAL_COMMUNITY): Payer: Self-pay | Admitting: Emergency Medicine

## 2015-03-19 DIAGNOSIS — Y998 Other external cause status: Secondary | ICD-10-CM | POA: Insufficient documentation

## 2015-03-19 DIAGNOSIS — S80861A Insect bite (nonvenomous), right lower leg, initial encounter: Secondary | ICD-10-CM | POA: Insufficient documentation

## 2015-03-19 DIAGNOSIS — Z7982 Long term (current) use of aspirin: Secondary | ICD-10-CM | POA: Diagnosis not present

## 2015-03-19 DIAGNOSIS — Y9389 Activity, other specified: Secondary | ICD-10-CM | POA: Insufficient documentation

## 2015-03-19 DIAGNOSIS — E785 Hyperlipidemia, unspecified: Secondary | ICD-10-CM | POA: Diagnosis not present

## 2015-03-19 DIAGNOSIS — Z79899 Other long term (current) drug therapy: Secondary | ICD-10-CM | POA: Insufficient documentation

## 2015-03-19 DIAGNOSIS — Z9861 Coronary angioplasty status: Secondary | ICD-10-CM | POA: Diagnosis not present

## 2015-03-19 DIAGNOSIS — I2510 Atherosclerotic heart disease of native coronary artery without angina pectoris: Secondary | ICD-10-CM | POA: Diagnosis not present

## 2015-03-19 DIAGNOSIS — Z8546 Personal history of malignant neoplasm of prostate: Secondary | ICD-10-CM | POA: Diagnosis not present

## 2015-03-19 DIAGNOSIS — Z7902 Long term (current) use of antithrombotics/antiplatelets: Secondary | ICD-10-CM | POA: Insufficient documentation

## 2015-03-19 DIAGNOSIS — W57XXXA Bitten or stung by nonvenomous insect and other nonvenomous arthropods, initial encounter: Secondary | ICD-10-CM | POA: Insufficient documentation

## 2015-03-19 DIAGNOSIS — I10 Essential (primary) hypertension: Secondary | ICD-10-CM | POA: Diagnosis not present

## 2015-03-19 DIAGNOSIS — Z880 Allergy status to penicillin: Secondary | ICD-10-CM | POA: Insufficient documentation

## 2015-03-19 DIAGNOSIS — Z8673 Personal history of transient ischemic attack (TIA), and cerebral infarction without residual deficits: Secondary | ICD-10-CM | POA: Diagnosis not present

## 2015-03-19 DIAGNOSIS — Z9981 Dependence on supplemental oxygen: Secondary | ICD-10-CM | POA: Insufficient documentation

## 2015-03-19 DIAGNOSIS — L02415 Cutaneous abscess of right lower limb: Secondary | ICD-10-CM | POA: Diagnosis not present

## 2015-03-19 DIAGNOSIS — G4733 Obstructive sleep apnea (adult) (pediatric): Secondary | ICD-10-CM | POA: Insufficient documentation

## 2015-03-19 DIAGNOSIS — L0291 Cutaneous abscess, unspecified: Secondary | ICD-10-CM

## 2015-03-19 DIAGNOSIS — Y9289 Other specified places as the place of occurrence of the external cause: Secondary | ICD-10-CM | POA: Insufficient documentation

## 2015-03-19 DIAGNOSIS — Z87891 Personal history of nicotine dependence: Secondary | ICD-10-CM | POA: Insufficient documentation

## 2015-03-19 DIAGNOSIS — Z9889 Other specified postprocedural states: Secondary | ICD-10-CM | POA: Insufficient documentation

## 2015-03-19 DIAGNOSIS — Z87448 Personal history of other diseases of urinary system: Secondary | ICD-10-CM | POA: Insufficient documentation

## 2015-03-19 NOTE — ED Provider Notes (Signed)
CSN: [id number]     Arrival date & time 03/19/15  0827 History   First MD Initiated Contact with Patient 03/19/15 [phone]     Chief Complaint  Patient presents with  . Insect Bite     (Consider location/radiation/quality/duration/timing/severity/associated sxs/prior Treatment) HPI   Eugene Murray Is a 75 year old gentleman who presents emergency Department with chief complaint of insect bite to his right lower leg. He has a past medical history of obstructive sleep apnea, hyperlipidemia, hyper tension, history of prostate cancer. Patient states that he developed a small wound on his right lower leg 3 days ago. He states that it was tender, swollen, with 2 small uterine lotions on the surface. He states that Sunday, it opened and drained purulent discharge. He Did a visual loss on his phone. He states that he has greatly improved. His wife pressured him to come in for evaluation today. He denies fevers, chills, history of boils, known bite from an insect, myalgias or other signs of systemic infection.  Past Medical History  Diagnosis Date  . Allergic rhinitis   . History of prostate cancer   . Hypertension   . OSA (obstructive sleep apnea)     cpap machine at home  . Coronary artery disease   . Hyperlipidemia   . Hypokalemia   . Stroke [facility])     "mini stroke" TIA  . Cancer [facility])     Prostate cancer  . Nephritis     as a child   Past Surgical History  Procedure Laterality Date  . Hernia repair Bilateral     x 2   different times  . Prostate surgery      had cancer; radical resection 1997   . Carotid stent  11-08 and 4-11    stent x 3 (12/2006 x 2; 2011 x 1 stent)  . Cardiac catheterization    . Coronary angioplasty    . Colonoscopy w/ polypectomy    . Colonoscopy  2015  . Cataract extraction w/phaco Left 08/21/2014    Procedure: PHACO EMULSION CATARACT EXTRACTION AND INTRAOCULAR LENS PLACEMENT ([place]) IMPLANT LEFT EYE;  Surgeon: Marylynn Pearson, MD;  Location: [place];  Service:  Ophthalmology;  Laterality: Left;   Family History  Problem Relation Age of Onset  . Asthma Sister   . Colon cancer Father     possibly age 104 y.o diagnosed   . Stroke Mother    Social History  Substance Use Topics  . Smoking status: Former Smoker -- 15 years    Types: Cigarettes    Quit date: 09/08/1985  . Smokeless tobacco: Never Used  . Alcohol Use: 1.8 oz/week    1 Glasses of wine, 2 Cans of beer per week     Comment: socially    Review of Systems  Ten systems reviewed and are negative for acute change, except as noted in the HPI.    Allergies  Other; Penicillins; and Sulfa antibiotics  Home Medications   Prior to Admission medications   Medication Sig Start Date End Date Taking? Authorizing Provider  amLODipine (NORVASC) 5 MG tablet Take 1 tablet (5 mg total) by mouth daily. 03/14/14  Yes Troy Sine, MD  aspirin 81 MG tablet Take 81 mg by mouth daily.     Yes Historical Provider, MD  atorvastatin (LIPITOR) 40 MG tablet Take 40 mg by mouth at bedtime.   Yes Historical Provider, MD  clopidogrel (PLAVIX) 75 MG tablet Take 1 tablet (75 mg total) by mouth daily. 03/14/14  Yes Troy Sine, MD  hydrochlorothiazide (HYDRODIURIL) 25 MG tablet Take 1 tablet (25 mg total) by mouth daily. 03/14/14  Yes Troy Sine, MD  lisinopril (PRINIVIL,ZESTRIL) 40 MG tablet Take 1 tablet (40 mg total) by mouth daily. 03/14/14  Yes Troy Sine, MD  metoprolol (LOPRESSOR) 50 MG tablet Take 1 tablet (50 mg total) by mouth 2 (two) times daily. 04/16/14  Yes Troy Sine, MD  topiramate (TOPAMAX) 50 MG tablet Take 1 tablet (50 mg total) by mouth 2 (two) times daily. Patient taking differently: Take 50 mg by mouth daily.  10/01/14  Yes Garvin Fila, MD  NITROSTAT 0.4 MG SL tablet PLACE 1 TABLET UNDER THE TONGUE EVERY 4 MINUTES AS NEEDED FOR CHEST PAIN. PLEASE MAKE APPOINTMENT 01/20/15   Carlena Bjornstad, MD   BP 126/80 mmHg  Pulse 58  Temp(Src) 97.4 F (36.3 C) (Oral)  Resp 20  Ht 5'  10" (1.778 m)  Wt 93.441 kg  BMI 29.56 kg/m2  SpO2 100% Physical Exam  Constitutional: He appears well-developed and well-nourished. No distress.  HENT:  Head: Normocephalic and atraumatic.  Eyes: Conjunctivae are normal. No scleral icterus.  Neck: Normal range of motion. Neck supple.  Cardiovascular: Normal rate, regular rhythm and normal heart sounds.   Pulmonary/Chest: Effort normal and breath sounds normal. No respiratory distress.  Abdominal: Soft. There is no tenderness.  Musculoskeletal: He exhibits no edema.  Neurological: He is alert.  Skin: Skin is warm and dry. He is not diaphoretic.  Right lower extremity with 2 cm circular region of erythema, nontender, 2 small areas of scabbing in the center. No fluctuance or induration. Appears well healing.  Psychiatric: His behavior is normal.  Nursing note and vitals reviewed.   ED Course  Procedures (including critical care time) Labs Review Labs Reviewed - No data to display  Imaging Review No results found. I have personally reviewed and evaluated these images and lab results as part of my medical decision-making.   EKG Interpretation None      MDM   Final diagnoses:  None   9:39 AM BP 126/80 mmHg  Pulse 58  Temp(Src) 97.4 F (36.3 C) (Oral)  Resp 20  Ht 5\' 10"  (1.778 m)  Wt 93.441 kg  BMI 29.56 kg/m2  SpO2 100%  Patient with what appears to be resolving skin infection or abscess of the right lower extremity. It appears very well healing without any signs of active infection. He has no other concerns at this time. Hemodynamically stable and afebrile. Feel he is appropriate for discharge at this time. He may follow-up with his primary care physician for further evaluation.   Margarita Mail, PA-C 03/19/15 [place], MD 03/20/15 [phone]

## 2015-03-19 NOTE — ED Notes (Signed)
Patient states he has "i think it's a spider bite" on his R leg.   Patient states that it swelled some and had redness, but he has been putting peroxide and campho phenique.   Patient states that it hasn't drained and appears to be drying up, but wanted to get checked.

## 2015-03-19 NOTE — Discharge Instructions (Signed)

## 2015-03-27 ENCOUNTER — Other Ambulatory Visit: Payer: Self-pay | Admitting: Cardiovascular Disease

## 2015-03-31 ENCOUNTER — Telehealth: Payer: Self-pay | Admitting: Cardiovascular Disease

## 2015-03-31 ENCOUNTER — Telehealth (HOSPITAL_COMMUNITY): Payer: Self-pay | Admitting: *Deleted

## 2015-03-31 NOTE — Telephone Encounter (Signed)
Patient came in stating he received a call that he needed to schedule a stress test.  There is no order for a stress test and no record of a call but he has the voicemail on his phone...does he need a stress test?

## 2015-04-01 ENCOUNTER — Other Ambulatory Visit: Payer: Self-pay | Admitting: *Deleted

## 2015-04-01 MED ORDER — AMLODIPINE BESYLATE 5 MG PO TABS: 5.0000 mg | ORAL_TABLET | Freq: Every day | ORAL | Status: DC

## 2015-04-01 MED ORDER — HYDROCHLOROTHIAZIDE 25 MG PO TABS: 25.0000 mg | ORAL_TABLET | Freq: Every day | ORAL | Status: DC

## 2015-04-01 MED ORDER — CLOPIDOGREL BISULFATE 75 MG PO TABS: 75.0000 mg | ORAL_TABLET | Freq: Every day | ORAL | Status: DC

## 2015-04-01 MED ORDER — LISINOPRIL 40 MG PO TABS: 40.0000 mg | ORAL_TABLET | Freq: Every day | ORAL | Status: DC

## 2015-04-02 ENCOUNTER — Other Ambulatory Visit: Payer: Self-pay | Admitting: Cardiovascular Disease

## 2015-04-02 NOTE — Telephone Encounter (Signed)
Eugene Cross do you know anything about this phone call?

## 2015-04-02 NOTE — Telephone Encounter (Signed)
Rx(s) sent to pharmacy electronically.  

## 2015-04-03 ENCOUNTER — Encounter: Payer: Self-pay | Admitting: Neurology

## 2015-04-03 ENCOUNTER — Ambulatory Visit (INDEPENDENT_AMBULATORY_CARE_PROVIDER_SITE_OTHER): Payer: Medicare Other | Admitting: Neurology

## 2015-04-03 VITALS — BP 129/77 | HR 48 | Ht 70.75 in | Wt 207.2 lb

## 2015-04-03 DIAGNOSIS — G44209 Tension-type headache, unspecified, not intractable: Secondary | ICD-10-CM | POA: Diagnosis not present

## 2015-04-03 MED ORDER — TOPIRAMATE 50 MG PO TABS: 50.0000 mg | ORAL_TABLET | Freq: Every day | ORAL | Status: DC

## 2015-04-03 NOTE — Patient Instructions (Signed)
I had a long discussion with the patient with regards to his episodes of numbness as well as headaches which appear to be stable. I recommend he discontinue aspirin 81 mg daily and stay on Plavix 75 mg daily alone for stroke prevention and maintain strict control of hypertension with blood pressure goal below 130/70 and lipids with LDL cholesterol goal below 70 mg percent. I also advised him to reduce Topamax to 50 mg daily since is having some cognitive side effects. He will return for follow-up in 6 months with Cecille Rubin, nurse practitioner or call earlier if necessary.

## 2015-04-03 NOTE — Progress Notes (Signed)
PATIENT: Eugene Murray DOB: [birth year]  REASON FOR VISIT: follow up HISTORY FROM: patient  HISTORY OF PRESENT ILLNESS: [name] is a 75 year old AA male who been having recurrent stereotypical episodes of right-sided weakness, heaviness and a strange feeling on the roof of the mouth on the right. These have been occurring for the last several weeks But have increased in last 1 week to 2-3 times a day He was admitted to [facility] on 06/30/12 with similar symptoms which had been ongoing off and on for one week. He had noticed some difficulty speaking as well. The symptoms lasted 10-15 minutes with a maximum episode lasting 20 minutes. CT scan was unremarkable. An MRI scan of the brain showed a equivocal tiny acute left pontomedullary junction infarct. MRA of the brain showed mild intracranial atherosclerotic changes. 2DEcho showed normal ejection fraction. Carotid Doppler showed no significant extracranial stenosis. Patient was brought to have a brainstem infarct due to small vessel disease and recurrent symptoms and was advised to add aspirin to his Plavix because of prior history of drug coated cardiac stents. Lipid profile was at goal. Patient states that his symptoms have persisted and despite adding aspirin and he's been having these now to 3 times a day. He does feel just symptoms are stereo typical and he gets an aura with a warning which is a strange feeling on the roof of his mouth on the inside of the right side. This is followed by a feeling of heaviness on the right side. He feels tired and needs to rest. This is followed by a mild headache which lasted for for A few hours. He denies known history of chronic migraines but does states that a few years ago he did have couple of episodes of transient visual distortions which were felt to be migraine variant. He does have some intermittent headaches off and on but he didn't feel he needed to go to the emergency room or take strong  medications for them. He does not describe specific triggers for these episodes. He does have history of sleep apnea and does use a CPAP which was last titrated a year ago and he feels it seems to be working fine. He does also complain of chronic low back pain with some recent increase in pain and some intermittent right leg weakness and ankle giving out. He did have epidural steroid injections in the past but and has no history of back surgery. He is not had any recent imaging studies of the back done.   UPDATE 11/15/12 (LL): Eugene Murray returns for follow up of TIA vs. Migraine variant symptoms and right leg weakness. After starting Topamax 50 mg at night, he noticed the right arm, right leg weakness, and the tingling sensation in the roof of his mouth disappeared after about 1 week on the medication. He tried going to twice a day but felt worse cognitively. He went back to once daily at night and is pleased. EEG was normal. MRI of the lumbar spine showed mild degenerative changes and mild central cord stenosis. He is taking Plavix and aspirin daily with no problems. Patient denies medication side effects, with no signs of bleeding or excessive bruising.   UPDATE 05/31/13 (LL):  Eugene Murray returns for follow up of TIA vs. Migraine variant symptoms and right leg weakness. BP is well controlled, it is 128/77 in the office today.  He is tolerating Plavix and aspirin daily (due to drug eluding stents)  with no signs  of bleeding or excessive bruising. He is having some mild headaches when he first gets up in the morning, across the left top side of his head, he thinks it is from his CPAP mask.  Chronic low back pain continues, but he stays active, goes to the gym regularly.  No new neurovascular symptoms. Update 04/02/2014 ; she returns for follow-up after last visit in April 2015. He continues to do well without any episodes of recurrent paresthesias for completed migraine. He does have minor headaches once or twice a  week which are 5/10 in severity. Pressure-like in quality which she can tolerate without medications. There is no accompanying light or sound sensitivity or any clear triggers. He does have sleep apnea and uses CPAP every night regularly. He is currently taking Topamax 50 mg at night and tolerating it well without any side effects. He remains on aspirin and Plavix because of cardiac stents and is tolerating it well with only minor bruising and no significant bleeding episodes. He has no new complaints today.  Update 10/01/2014 : He returns for follow-up after last visit 6 months ago. He has had no further recurrent spells of weakness and numbness but states that his noticed increasing daily headaches for the last 4-5 months. He describes these as being mild mostly 5/10 in severity located over the left frontal and temporal regions. The headache is not a complaint bad nausea vomiting light or sound sensitivity. He had cataract surgery on [id number] without complications and has noticed slight improvement in his vision. He denies any loss of vision, myalgia, jaw claudication or arthralgias. He remains on Topamax 50 mg at night which is tolerating well without side effects. He is also on aspirin and Plavix without significant bruising or bleeding episodes. He had lipid profile checked a few months ago by primary physician and it was fine. He states his blood pressure is good and today it is 113/73. Update 04/03/2015 : He returns for follow-up after last visit 6 months ago. He continues to do well without recurrent stroke or TIA symptoms. He feels his headaches are much better the milder they're less frequent and he rarely needs to take ibuprofen which works quite well. He is tolerating Topamax but feels that he is having some short-term memory difficulties which may be related to that. His blood pressure is well controlled and it is 129/70 today. Patient is quite active and walks a mile a day and goes to the gym for 1-2  hours 3 days a week. He has no other complaints today ROS:  14 system review of systems is positive for   , headaches, palpitations, cough, apnea, daytime sleepiness, skin moles  only and all other systems negative.  ALLERGIES: Allergies  Allergen Reactions  . Other Hives    SEAFOOD  . Penicillins Other (See Comments)    "caused me to pass out"  . Sulfa Antibiotics Hives    HOME MEDICATIONS: Outpatient Prescriptions Prior to Visit  Medication Sig Dispense Refill  . amLODipine (NORVASC) 5 MG tablet Take 1 tablet (5 mg total) by mouth daily. 30 tablet 6  . atorvastatin (LIPITOR) 40 MG tablet Take 1 tablet (40 mg total) by mouth daily. PATIENT NEEDS TO CONTACT OFFICE FOR ADDITIONAL REFILLS 15 tablet 0  . clopidogrel (PLAVIX) 75 MG tablet Take 1 tablet (75 mg total) by mouth daily. 30 tablet 6  . hydrochlorothiazide (HYDRODIURIL) 25 MG tablet Take 1 tablet (25 mg total) by mouth daily. 30 tablet 6  . lisinopril (PRINIVIL,ZESTRIL)  40 MG tablet Take 1 tablet (40 mg total) by mouth daily. 30 tablet 6  . metoprolol (LOPRESSOR) 50 MG tablet TAKE 1 TABLET BY MOUTH TWICE DAILY 180 tablet 0  . NITROSTAT 0.4 MG SL tablet PLACE 1 TABLET UNDER THE TONGUE EVERY 4 MINUTES AS NEEDED FOR CHEST PAIN. PLEASE MAKE APPOINTMENT 25 tablet 0  . aspirin 81 MG tablet Take 81 mg by mouth daily.      Marland Kitchen topiramate (TOPAMAX) 50 MG tablet Take 1 tablet (50 mg total) by mouth 2 (two) times daily. (Patient taking differently: Take 50 mg by mouth daily. ) 90 tablet 3   No facility-administered medications prior to visit.     PHYSICAL EXAM  Filed Vitals:   04/03/15 1001  BP: 129/77  Pulse: 48  Height: 5' 10.75" (1.797 m)  Weight: 207 lb 3.2 oz (93.985 kg)   Body mass index is 29.1 kg/(m^2).  Physical Exam  General: well developed, well nourished elderly african American male , seated, in no evident distress  Head: head normocephalic and atraumatic.   Neck: supple with no carotid or supraclavicular bruits    Cardiovascular: regular rate and rhythm, no murmurs  Musculoskeletal: no deformity. No back tenderness. Straight leg raising test is negative.  Skin: no rash/petichiae  Vascular: Normal pulses all extremities   Neurologic Exam  Mental Status: Awake and fully alert. Oriented to place and time. Recent and remote memory intact. Attention span, concentration and fund of knowledge appropriate. Mood and affect appropriate.  Cranial Nerves: Fundoscopic exam not done . Pupils equal, briskly reactive to light. Extraocular movements full without nystagmus. Visual fields full to confrontation. Hearing intact. Facial sensation intact. Face, tongue, palate moves normally and symmetrically.  Motor: Normal bulk and tone. Normal strength in all tested extremity muscles.  Sensory.: intact to tough and pinprick and vibratory.  Coordination: Rapid alternating movements normal in all extremities. Finger-to-nose and heel-to-shin performed accurately bilaterally.  Gait and Station: Arises from chair without difficulty. Stance is normal. Gait demonstrates normal stride length and balance . Able to heel, toe and tandem walk with slight difficulty.  Reflexes: 1+ and symmetric except right knee jerk and ankle jerk are absent. Toes downgoing.   ASSESSMENT AND PLAN 75 year old male with recurrent episodes of transient right body heaviness, weakness as well as funny sensation in the mouth followed by mild headache possible complicated migraines. Doubt TIAs given the stereotypical nature of the symptoms.  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  New frontal and temporal headache likely tension headache PLAN:   I had a long discussion with the patient with regards to his episodes of numbness as well as headaches which appear to be stable. I recommend he discontinue aspirin 81 mg daily and stay on  Plavix 75 mg daily alone for stroke prevention and maintain strict control of hypertension with blood pressure goal below 130/70 and lipids with LDL cholesterol goal below 70 mg percent. I also advised him to reduce Topamax to 50 mg daily since is having some cognitive side effects. He will return for follow-up in 6 months with Cecille Rubin, nurse practitioner or call earlier if necessary.   Antony Contras, MD  04/03/2015, 12:55 PM [facility] [address] [place], [place] [address] [phone]  Note: This document was prepared with digital dictation and possible smart phrase technology. Any transcriptional errors that result from this process  are unintentional.

## 2015-04-24 ENCOUNTER — Other Ambulatory Visit: Payer: Self-pay | Admitting: Cardiovascular Disease

## 2015-04-24 NOTE — Telephone Encounter (Signed)
Rx(s) sent to pharmacy electronically.  

## 2015-05-15 ENCOUNTER — Other Ambulatory Visit: Payer: Self-pay | Admitting: Cardiovascular Disease

## 2015-05-15 NOTE — Telephone Encounter (Signed)
REFILL 

## 2015-05-18 ENCOUNTER — Other Ambulatory Visit: Payer: Self-pay | Admitting: Cardiovascular Disease

## 2015-05-26 ENCOUNTER — Telehealth: Payer: Self-pay | Admitting: Cardiovascular Disease

## 2015-05-26 MED ORDER — ATORVASTATIN CALCIUM 40 MG PO TABS: 40.0000 mg | ORAL_TABLET | Freq: Every day | ORAL | Status: DC

## 2015-05-26 NOTE — Telephone Encounter (Signed)
E SENT TO PHARMACY 

## 2015-05-26 NOTE — Telephone Encounter (Signed)
°*  STAT* If patient is at the pharmacy, call can be transferred to refill team.   1. Which medications need to be refilled? (please list name of each medication and dose if known) Atorvastatin 40mg     2. Which pharmacy/location (including street and city if local pharmacy) is medication to be sent to?[facility] on [address]   3. Do they need a 30 day or 90 day supply?30  Needed to make an appt to get medication refill

## 2015-05-30 DIAGNOSIS — H25811 Combined forms of age-related cataract, right eye: Secondary | ICD-10-CM | POA: Diagnosis not present

## 2015-05-30 DIAGNOSIS — H43813 Vitreous degeneration, bilateral: Secondary | ICD-10-CM | POA: Diagnosis not present

## 2015-06-17 ENCOUNTER — Other Ambulatory Visit: Payer: Self-pay | Admitting: Cardiovascular Disease

## 2015-06-17 NOTE — Telephone Encounter (Signed)
Rx request sent to pharmacy.  

## 2015-08-01 ENCOUNTER — Encounter: Payer: Self-pay | Admitting: Cardiovascular Disease

## 2015-08-01 ENCOUNTER — Ambulatory Visit (INDEPENDENT_AMBULATORY_CARE_PROVIDER_SITE_OTHER): Payer: Medicare Other | Admitting: Cardiovascular Disease

## 2015-08-01 VITALS — BP 128/82 | HR 57 | Ht 70.0 in | Wt 206.4 lb

## 2015-08-01 DIAGNOSIS — E785 Hyperlipidemia, unspecified: Secondary | ICD-10-CM | POA: Diagnosis not present

## 2015-08-01 DIAGNOSIS — G4733 Obstructive sleep apnea (adult) (pediatric): Secondary | ICD-10-CM

## 2015-08-01 DIAGNOSIS — I2510 Atherosclerotic heart disease of native coronary artery without angina pectoris: Secondary | ICD-10-CM

## 2015-08-01 DIAGNOSIS — I10 Essential (primary) hypertension: Secondary | ICD-10-CM

## 2015-08-01 DIAGNOSIS — Z79899 Other long term (current) drug therapy: Secondary | ICD-10-CM | POA: Diagnosis not present

## 2015-08-01 DIAGNOSIS — R001 Bradycardia, unspecified: Secondary | ICD-10-CM

## 2015-08-01 NOTE — Patient Instructions (Signed)
Medication Instructions: Dr Claiborne Billings recommends that you continue on your current medications as directed. Please refer to the Current Medication list given to you today.  Labwork: Your physician recommends that you return for lab work at your earliest [place].  Testing/Procedures: NONE ORDERED  Follow-up: Dr Claiborne Billings recommends that you schedule a follow-up appointment in 1 year. You will receive a reminder letter in the mail two months in advance. If you don't receive a letter, please call our office to schedule the follow-up appointment.  If you need a refill on your cardiac medications before your next appointment, please call your pharmacy.

## 2015-08-02 ENCOUNTER — Encounter: Payer: Self-pay | Admitting: Cardiovascular Disease

## 2015-08-02 NOTE — Progress Notes (Signed)
Patient ID: Eugene Murray, male   DOB: 1941-01-11, 75 y.o.   MRN: [id number]      HPI: Eugene Murray  is a 75 y.o. male presents to the office today for a 17 month follow-up cardiology evaluation.   Eugene Murray  has a history of hypertension, hyperlipidemia, obstructive sleep apnea on CPAP therapy, history of prostate cancer, and CAD.  In November 2008, he underwent initial PCI to his diagonal vessel and RCA. In April 2011 he was found to have a new 95% stenosis beyond is widely patent RCA stent in the distal RCA at which time a 3.5x12 mm Promus DES stent was inserted. His diagonal stent was patent. He had 20% mid LAD stenosis. In June 2013 a perfusion study was normal. In 2014 he experienced symptoms of weakness involving his right arm with slowness of speech and dysarthria. He was seen by the stroke team. A CT of his head did not show acute intracranial abnormality. MRI showed an equivocal tiny acute left pontomedullary junction stroke. He had mild atherosclerotic changes but otherwise unremarkable. He had seen a neurologist and was started on topiramate. He has been stable neurologically.  Over the past year from a cardiac standpoint, he has remained relatively stable without chest pain.  He goes to the gym several days per week.  He denies any significant episodes of recurrent chest pain.  There have been some short fleeting episodes.  He denies shortness of breath. He denies palpitations. He denies presyncope or syncope. He  has been tolerating atorvastatin 40 mg for his hyperlipidemia.  He denies bleeding with continuation of dual antiplatelet therapy.  He is unaware of any significant blood pressure elevation is current regimen consisting of amlodipine 5 mg, HCTZ 25 mg, lisinopril 40 mg, and metoprolol 50 mg twice a day.    He has obstructive sleep apnea and continues to use CPAP with 100% compliance.  He is unaware of breakthrough snoring.  He denies daytime sleepiness.  He may qualify for new CPAP  machine.  He has noticed some mild right ankle edema and has been wearing a ankle brace for fallen arches.  He presents for follow-up evaluation.  Past Medical History  Diagnosis Date  . Allergic rhinitis   . History of prostate cancer   . Hypertension   . OSA (obstructive sleep apnea)     cpap machine at home  . Coronary artery disease   . Hyperlipidemia   . Hypokalemia   . Stroke [facility])     "mini stroke" TIA  . Cancer [facility])     Prostate cancer  . Nephritis     as a child    Past Surgical History  Procedure Laterality Date  . Hernia repair Bilateral     x 2   different times  . Prostate surgery      had cancer; radical resection 1997   . Carotid stent  11-08 and 4-11    stent x 3 (12/2006 x 2; 2011 x 1 stent)  . Cardiac catheterization    . Coronary angioplasty    . Colonoscopy w/ polypectomy    . Colonoscopy  2015  . Cataract extraction w/phaco Left 08/21/2014    Procedure: PHACO EMULSION CATARACT EXTRACTION AND INTRAOCULAR LENS PLACEMENT ([place]) IMPLANT LEFT EYE;  Surgeon: Marylynn Pearson, MD;  Location: [place];  Service: Ophthalmology;  Laterality: Left;    Allergies  Allergen Reactions  . Other Hives    SEAFOOD  . Penicillins Other (See Comments)    "  caused me to pass out"  . Sulfa Antibiotics Hives    Current Outpatient Prescriptions  Medication Sig Dispense Refill  . amLODipine (NORVASC) 5 MG tablet Take 1 tablet (5 mg total) by mouth daily. 30 tablet 6  . aspirin 81 MG tablet Take 81 mg by mouth as needed for pain.    Marland Kitchen atorvastatin (LIPITOR) 40 MG tablet Take 1 tablet (40 mg total) by mouth daily. <PLEASE MAKE APPOINTMENT FOR REFILLS> 30 tablet 3  . clopidogrel (PLAVIX) 75 MG tablet Take 1 tablet (75 mg total) by mouth daily. 30 tablet 6  . hydrochlorothiazide (HYDRODIURIL) 25 MG tablet Take 1 tablet (25 mg total) by mouth daily. 30 tablet 6  . lisinopril (PRINIVIL,ZESTRIL) 40 MG tablet Take 1 tablet (40 mg total) by mouth daily. 30 tablet 6  . metoprolol  (LOPRESSOR) 50 MG tablet Take 1 tablet (50 mg total) by mouth 2 (two) times daily. 60 tablet 2  . NITROSTAT 0.4 MG SL tablet PLACE 1 TABLET UNDER THE TONGUE EVERY 4 MINUTES AS NEEDED FOR CHEST PAIN. PLEASE MAKE APPOINTMENT 25 tablet 0  . topiramate (TOPAMAX) 50 MG tablet Take 1 tablet (50 mg total) by mouth daily. 90 tablet 3   No current facility-administered medications for this visit.    Socially he is married has 3 children and 2 grandchildren. He does drink occasional alcohol. There is no tobacco use. He does exercise occasionally.  ROS General: Negative; No fevers, chills, or night sweats;  HEENT: Negative; No changes in vision or hearing, sinus congestion, difficulty swallowing Pulmonary: Negative; No cough, wheezing, shortness of breath, hemoptysis Cardiovascular: Negative; No chest pain, presyncope, syncope, palpitations GI: Negative; No nausea, vomiting, diarrhea, or abdominal pain GU: History of prostate CA No dysuria, hematuria, or difficulty voiding Musculoskeletal: History of back discomfort intermittently; no myalgias, joint pain, or weakness Hematologic/Oncology: Negative; no easy bruising, bleeding Endocrine: Negative; no heat/cold intolerance; no diabetes Neuro: Negative; no changes in balance, headaches Skin: Negative; No rashes or skin lesions Psychiatric: Negative; No behavioral problems, depression Sleep: Objective sleep apnea on CPAP therapy.  He admits to 100% compliance.  No breakthrough snoring, daytime sleepiness, hypersomnolence, bruxism, restless legs, hypnogognic hallucinations, no cataplexy Other comprehensive 14 point system review is negative.  PE BP 128/82 mmHg  Pulse 57  Ht '5\' 10"'  (1.778 m)  Wt 206 lb 6.4 oz (93.622 kg)  BMI 29.62 kg/m2  SpO2 97%   Wt Readings from Last 3 Encounters:  08/01/15 206 lb 6.4 oz (93.622 kg)  04/03/15 207 lb 3.2 oz (93.985 kg)  03/19/15 206 lb (93.441 kg)   General: Alert, oriented, no distress.  HEENT:  Normocephalic, atraumatic. Pupils round and reactive; sclera anicteric;  Nose with nasal septal hypertrophy Mouth/Parynx benign; Mallinpatti scale 3 Neck: No JVD, no carotid bruits with normal carotid upstroke Lungs: clear to ausculatation and percussion; no wheezing or rales Chest wall: Nontender to palpation Heart: RRR, s1 s2 normal 1/6 systolic ejection murmur, unchanged; no diastolic murmur.  No rubs thrills or heaves Abdomen: soft, nontender; no hepatosplenomehaly, BS+; abdominal aorta nontender and not dilated by palpation. Back: No CVA tenderness Pulses 2+ Extremities: He is wearing a right ankle brace.  There is trace to 1+ edema above the brace.  no clubbing cyanosis, Homan's sign negative  Neurologic: grossly nonfocal Psychologic: Normal affect and mood  ECG (independently read by me): Sinus bradycardia 57 bpm.  Nonspecific T-wave in lead 03 March 2014 ECG (independently read by me): Sinus bradycardia at 57 bpm.  No  significant ST segment changes.  November 2014 ECG sinus rhythm at 57 beats per minute. PR interval 190 ms QTc interval 377 ms  LABS:  BMP Latest Ref Rng 08/21/2014 03/12/2014 07/01/2012  Glucose 65 - 99 mg/dL 92 79 91  BUN 6 - 20 mg/dL '11 18 14  ' Creatinine 0.61 - 1.24 mg/dL 0.92 0.84 1.01  Sodium 135 - 145 mmol/L 141 140 139  Potassium 3.5 - 5.1 mmol/L 3.5 3.7 3.1(L)  Chloride 101 - 111 mmol/L 107 104 101  CO2 22 - 32 mmol/L '26 31 28  ' Calcium 8.9 - 10.3 mg/dL 9.0 8.6 8.5    Hepatic Function Latest Ref Rng 03/12/2014 06/30/2012 12/31/2006  Total Protein 6.0 - 8.3 g/dL 6.7 7.1 6.5  Albumin 3.5 - 5.2 g/dL 3.9 3.7 3.4(L)  AST 0 - 37 U/L '15 19 20  ' ALT 0 - 53 U/L '12 14 19  ' Alk Phosphatase 39 - 117 U/L 58 69 58  Total Bilirubin 0.2 - 1.2 mg/dL 0.6 0.5 0.9    CBC Latest Ref Rng 08/21/2014 03/12/2014 07/01/2012  WBC 4.0 - 10.5 K/uL 5.8 5.8 6.1  Hemoglobin 13.0 - 17.0 g/dL 15.0 14.5 13.0  Hematocrit 39.0 - 52.0 % 42.3 41.7 36.8(L)  Platelets 150 - 400 K/uL 145(L)  144(L) 135(L)    Lab Results  Component Value Date   MCV 85.3 08/21/2014   MCV 87.8 03/12/2014   MCV 83.4 07/01/2012    Lab Results  Component Value Date   TSH 0.765 03/12/2014   Lab Results  Component Value Date   HGBA1C 5.4 07/01/2012   Lipid Panel     Component Value Date/Time   CHOL 117 03/12/2014 1009   TRIG 61 03/12/2014 1009   HDL 33* 03/12/2014 1009   CHOLHDL 3.5 03/12/2014 1009   VLDL 12 03/12/2014 1009   LDLCALC 72 03/12/2014 1009     RADIOLOGY: Ct Head (brain) Wo Murray  06/30/2012   *RADIOLOGY REPORT*  Clinical Data: Acute onset right upper and lower extremity weakness.  Right-sided foot drop.  CT HEAD WITHOUT Murray  Technique:  Contiguous axial images were obtained from the base of the skull through the vertex without Murray.  Comparison: None.  Findings: No evidence of acute infarct, acute hemorrhage, mass lesion, mass effect or hydrocephalus.  Minimal periventricular low attenuation.  Small retention cysts or polyps are seen in the left maxillary sinus.  IMPRESSION:  1.  No acute findings. 2.  Minimal chronic microvascular white matter ischemic changes.   Original Report Authenticated By: Lorin Picket, M.D.   Eugene Murray  06/30/2012   *RADIOLOGY REPORT*  Clinical Data:  Right-sided weakness.  Hypertension.  History prostate cancer.  MRI BRAIN WITHOUT Murray MRA HEAD WITHOUT Murray  Technique: Multiplanar, multiecho pulse sequences of the brain and surrounding structures were obtained according to standard protocol without intravenous Murray.  Angiographic images of the head were obtained using MRA technique without Murray.  Comparison: 06/30/2012 head CT.  No comparison brain Eugene.  MRI HEAD  Findings:  Question tiny acute infarct left pontomedullary junction.  Moderate white matter type changes most consistent with result of small vessel disease.  No intracranial hemorrhage.  No hydrocephalus.  No intracranial mass lesion detected on  this unenhanced exam.  Major intracranial vascular structures are patent.  Upper cervical cord is of decreased caliber of questionable significance/etiology.  Cervical medullary junction, pituitary region and pineal region unremarkable.  Exophthalmos.  IMPRESSION: Question tiny acute infarct left pontomedullary junction.  Moderate white  matter type changes most consistent with result of small vessel disease.  Please see above  MRA HEAD  Findings: Anterior circulation without medium or large size vessel significant stenosis or occlusion.  Middle cerebral artery mild branch vessel irregularity bilaterally.  Mild narrowing distal M1 segment left middle cerebral artery.  Ectatic vertebral arteries and basilar artery.  Nonvisualization left PICA and right AICA.  Mild branch vessel irregularity superior cerebellar artery and posterior cerebral artery bilaterally.  No aneurysm or vascular malformation noted.  IMPRESSION: Mild intracranial atherosclerotic type changes as detailed above the   Original Report Authenticated By: Genia Del, M.D.   Eugene Brain Wo Murray  06/30/2012   *RADIOLOGY REPORT*  Clinical Data:  Right-sided weakness.  Hypertension.  History prostate cancer.  MRI BRAIN WITHOUT Murray MRA HEAD WITHOUT Murray  Technique: Multiplanar, multiecho pulse sequences of the brain and surrounding structures were obtained according to standard protocol without intravenous Murray.  Angiographic images of the head were obtained using MRA technique without Murray.  Comparison: 06/30/2012 head CT.  No comparison brain Eugene.  MRI HEAD  Findings:  Question tiny acute infarct left pontomedullary junction.  Moderate white matter type changes most consistent with result of small vessel disease.  No intracranial hemorrhage.  No hydrocephalus.  No intracranial mass lesion detected on this unenhanced exam.  Major intracranial vascular structures are patent.  Upper cervical cord is of decreased caliber of questionable  significance/etiology.  Cervical medullary junction, pituitary region and pineal region unremarkable.  Exophthalmos.  IMPRESSION: Question tiny acute infarct left pontomedullary junction.  Moderate white matter type changes most consistent with result of small vessel disease.  Please see above  MRA HEAD  Findings: Anterior circulation without medium or large size vessel significant stenosis or occlusion.  Middle cerebral artery mild branch vessel irregularity bilaterally.  Mild narrowing distal M1 segment left middle cerebral artery.  Ectatic vertebral arteries and basilar artery.  Nonvisualization left PICA and right AICA.  Mild branch vessel irregularity superior cerebellar artery and posterior cerebral artery bilaterally.  No aneurysm or vascular malformation noted.  IMPRESSION: Mild intracranial atherosclerotic type changes as detailed above the   Original Report Authenticated By: Genia Del, M.D.      ASSESSMENT AND PLAN: Eugene. Shemuel Murray is a 75 year old African-American male who has CAD and underwent initial percutaneous coronary intervention in November 2008 to his diagonal and RCA.  In  April 2011 he was found to have a new 95% stenosis beyond his distal RCA stent for which a new DES stent was inserted.  His last stress test was in 2013.  He has been able to exercise several days per week.  He is not having any recurrent anginal symptoms and denies any change in exercise tolerance.  His ECG today is stable.  His blood pressure today is controlled on his regimen consisting of amlodipine, HCTZ, lisinopril, and metoprolol.  He has continued to take Plavix but apparently has stopped taking aspirin.  With his CAD I have suggested resumption of 81 mg aspirin.   He denies myalgias on his atorvastatin. Target LDL is less than 70. He denies any recent neurologic symptoms.  His CPAP machine is greater than 76 years old and in the future he may qualify for a new machine.  He continues to use CPAP therapy with  100% compliance.  He denies any breakthrough snoring.  He denies symptoms of hypersomnolence.  I am recommending fasting laboratory be obtained.  I will contact him regarding the results.  As long  as he remains stable I will see him in one year for reevaluation. Time spent: 25 minutes  Shelva Majestic 08/02/2015 3:08 PM

## 2015-08-05 DIAGNOSIS — E785 Hyperlipidemia, unspecified: Secondary | ICD-10-CM | POA: Diagnosis not present

## 2015-08-05 DIAGNOSIS — I2510 Atherosclerotic heart disease of native coronary artery without angina pectoris: Secondary | ICD-10-CM | POA: Diagnosis not present

## 2015-08-05 DIAGNOSIS — I10 Essential (primary) hypertension: Secondary | ICD-10-CM | POA: Diagnosis not present

## 2015-08-05 DIAGNOSIS — Z79899 Other long term (current) drug therapy: Secondary | ICD-10-CM | POA: Diagnosis not present

## 2015-08-05 LAB — CBC
HCT: 41.3 % (ref 38.5–50.0)
Hemoglobin: 14.1 g/dL (ref 13.2–17.1)
MCH: 30.1 pg (ref 27.0–33.0)
MCHC: 34.1 g/dL (ref 32.0–36.0)
MCV: 88.2 fL (ref 80.0–100.0)
MPV: 9.6 fL (ref 7.5–12.5)
PLATELETS: 147 10*3/uL (ref 140–400)
RBC: 4.68 MIL/uL (ref 4.20–5.80)
RDW: 14.2 % (ref 11.0–15.0)
WBC: 5.1 10*3/uL (ref 3.8–10.8)

## 2015-08-06 LAB — COMPREHENSIVE METABOLIC PANEL
ALT: 13 U/L (ref 9–46)
AST: 17 U/L (ref 10–35)
Albumin: 3.8 g/dL (ref 3.6–5.1)
Alkaline Phosphatase: 57 U/L (ref 40–115)
BUN: 14 mg/dL (ref 7–25)
CO2: 27 mmol/L (ref 20–31)
Calcium: 8.3 mg/dL — ABNORMAL LOW (ref 8.6–10.3)
Chloride: 107 mmol/L (ref 98–110)
Creat: 1.04 mg/dL (ref 0.70–1.18)
Glucose, Bld: 78 mg/dL (ref 65–99)
Potassium: 4.4 mmol/L (ref 3.5–5.3)
Sodium: 140 mmol/L (ref 135–146)
Total Bilirubin: 0.7 mg/dL (ref 0.2–1.2)
Total Protein: 6.3 g/dL (ref 6.1–8.1)

## 2015-08-06 LAB — LIPID PANEL
CHOL/HDL RATIO: 3.1 ratio (ref <=5.0)
CHOLESTEROL: 110 mg/dL — AB (ref 125–200)
HDL: 36 mg/dL — ABNORMAL LOW (ref >=40)
LDL Cholesterol: 60 mg/dL (ref <130)
TRIGLYCERIDES: 70 mg/dL (ref <150)
VLDL: 14 mg/dL (ref <30)

## 2015-08-06 LAB — TSH: TSH: 0.73 mIU/L (ref 0.40–4.50)

## 2015-09-15 DIAGNOSIS — E559 Vitamin D deficiency, unspecified: Secondary | ICD-10-CM | POA: Diagnosis not present

## 2015-09-15 DIAGNOSIS — I119 Hypertensive heart disease without heart failure: Secondary | ICD-10-CM | POA: Diagnosis not present

## 2015-09-15 DIAGNOSIS — I2510 Atherosclerotic heart disease of native coronary artery without angina pectoris: Secondary | ICD-10-CM | POA: Diagnosis not present

## 2015-09-15 DIAGNOSIS — E785 Hyperlipidemia, unspecified: Secondary | ICD-10-CM | POA: Diagnosis not present

## 2015-09-30 ENCOUNTER — Other Ambulatory Visit: Payer: Self-pay | Admitting: Cardiovascular Disease

## 2015-09-30 NOTE — Telephone Encounter (Signed)
Rx sent to pharmacy   

## 2015-10-01 ENCOUNTER — Encounter: Payer: Self-pay | Admitting: *Deleted

## 2015-10-01 ENCOUNTER — Encounter: Payer: Self-pay | Admitting: Nurse Practitioner

## 2015-10-01 ENCOUNTER — Ambulatory Visit (INDEPENDENT_AMBULATORY_CARE_PROVIDER_SITE_OTHER): Payer: Medicare Other | Admitting: Nurse Practitioner

## 2015-10-01 VITALS — BP 138/82 | HR 68 | Resp 20 | Ht 70.0 in | Wt 208.0 lb

## 2015-10-01 DIAGNOSIS — I10 Essential (primary) hypertension: Secondary | ICD-10-CM | POA: Diagnosis not present

## 2015-10-01 DIAGNOSIS — I6330 Cerebral infarction due to thrombosis of unspecified cerebral artery: Secondary | ICD-10-CM | POA: Diagnosis not present

## 2015-10-01 DIAGNOSIS — I639 Cerebral infarction, unspecified: Secondary | ICD-10-CM

## 2015-10-01 DIAGNOSIS — G43809 Other migraine, not intractable, without status migrainosus: Secondary | ICD-10-CM | POA: Diagnosis not present

## 2015-10-01 NOTE — Patient Instructions (Addendum)
Continue Plavix for secondary stroke prevention Continue  Aspirin per Dr. Claiborne Billings Maintain strict control of hypertension with blood pressure goal below 130/70 Lipids with LDL cholesterol below 70 Continue Topamax 50 mg daily Continue to exercise Continue to eat healthy diet with fresh fruits and vegetables Follow-up in 6 months

## 2015-10-01 NOTE — Progress Notes (Signed)
[facility]  PATIENT: Eugene Murray DOB: Jul 16, 1940   REASON FOR VISIT: History of stroke in 2014, tension headaches HISTORY FROM: Patient    HISTORY OF PRESENT ILLNESS:PSMr. Inlow is a 75 year old AA male who been having recurrent stereotypical episodes of right-sided weakness, heaviness and a strange feeling on the roof of the mouth on the right. These have been occurring for the last several weeks But have increased in last 1 week to 2-3 times a day He was admitted to [facility] on 06/30/12 with similar symptoms which had been ongoing off and on for one week. He had noticed some difficulty speaking as well. The symptoms lasted 10-15 minutes with a maximum episode lasting 20 minutes. CT scan was unremarkable. An MRI scan of the brain showed a equivocal tiny acute left pontomedullary junction infarct. MRA of the brain showed mild intracranial atherosclerotic changes. 2DEcho showed normal ejection fraction. Carotid Doppler showed no significant extracranial stenosis. Patient was brought to have a brainstem infarct due to small vessel disease and recurrent symptoms and was advised to add aspirin to his Plavix because of prior history of drug coated cardiac stents. Lipid profile was at goal. Patient states that his symptoms have persisted and despite adding aspirin and he's been having these now to 3 times a day. He does feel just symptoms are stereo typical and he gets an aura with a warning which is a strange feeling on the roof of his mouth on the inside of the right side. This is followed by a feeling of heaviness on the right side. He feels tired and needs to rest. This is followed by a mild headache which lasted for for A few hours. He denies known history of chronic migraines but does states that a few years ago he did have couple of episodes of transient visual distortions which were felt to be migraine variant. He does have some intermittent headaches off and on but he  didn't feel he needed to go to the emergency room or take strong medications for them. He does not describe specific triggers for these episodes. He does have history of sleep apnea and does use a CPAP which was last titrated a year ago and he feels it seems to be working fine. He does also complain of chronic low back pain with some recent increase in pain and some intermittent right leg weakness and ankle giving out. He did have epidural steroid injections in the past but and has no history of back surgery. He is not had any recent imaging studies of the back done.  UPDATE 05/31/13 (LL):  Eugene Murray returns for follow up of TIA vs. Migraine variant symptoms and right leg weakness. BP is well controlled, it is 128/77 in the office today.  He is tolerating Plavix and aspirin daily (due to drug eluding stents)  with no signs of bleeding or excessive bruising. He is having some mild headaches when he first gets up in the morning, across the left top side of his head, he thinks it is from his CPAP mask.  Chronic low back pain continues, but he stays active, goes to the gym regularly.  No new neurovascular symptoms. Update 04/02/2014 PS; he returns for follow-up after last visit in April 2015. He continues to do well without any episodes of recurrent paresthesias for completed migraine. He does have minor headaches once or twice a week which are 5/10 in severity. Pressure-like in quality which she can tolerate without medications. There  is no accompanying light or sound sensitivity or any clear triggers. He does have sleep apnea and uses CPAP every night regularly. He is currently taking Topamax 50 mg at night and tolerating it well without any side effects. He remains on aspirin and Plavix because of cardiac stents and is tolerating it well with only minor bruising and no significant bleeding episodes. He has no new complaints today.  Update 10/01/2014 PS: He returns for follow-up after last visit 6 months ago. He has  had no further recurrent spells of weakness and numbness but states that his noticed increasing daily headaches for the last 4-5 months. He describes these as being mild mostly 5/10 in severity located over the left frontal and temporal regions. The headache is not a complaint bad nausea vomiting light or sound sensitivity. He had cataract surgery on [id number] without complications and has noticed slight improvement in his vision. He denies any loss of vision, myalgia, jaw claudication or arthralgias. He remains on Topamax 50 mg at night which is tolerating well without side effects. He is also on aspirin and Plavix without significant bruising or bleeding episodes. He had lipid profile checked a few months ago by primary physician and it was fine. He states his blood pressure is good and today it is 113/73. Update 8/2//2017 CM: Eugene Murray, 75 year old male returns for follow-up after last visit 6 months ago. He continues to do well without recurrent stroke or TIA symptoms. He feels his headaches are about the same  they're less frequent and he rarely needs to take ibuprofen which works quite well. He is tolerating Topamax and denies any short-term memory difficulties. He is currently taking 50 mg daily. He does not wish to taper the medication. His blood pressure in the office today is 138/82. He had recent changes to his HCTZ  due to complaints of dizziness. Patient is quite active and walks a mile a day and goes to the gym for 1-2 hours 3 days a week. He has no other complaints today   REVIEW OF SYSTEMS: Full 14 system review of systems performed and notable only for those listed, all others are neg:  Constitutional: neg  Cardiovascular: neg Ear/Nose/Throat: neg  Skin: neg Eyes: neg Respiratory: neg Gastroitestinal: neg  Hematology/Lymphatic: neg  Endocrine: neg Musculoskeletal:neg Allergy/Immunology: neg Neurological: neg Psychiatric: neg Sleep : neg   ALLERGIES: Allergies  Allergen Reactions    . Other Hives    SEAFOOD  . Penicillins Other (See Comments)    "caused me to pass out"  . Sulfa Antibiotics Hives    HOME MEDICATIONS: Outpatient Medications Prior to Visit  Medication Sig Dispense Refill  . amLODipine (NORVASC) 5 MG tablet Take 1 tablet (5 mg total) by mouth daily. 30 tablet 6  . aspirin 81 MG tablet Take 81 mg by mouth every other day.     Marland Kitchen atorvastatin (LIPITOR) 40 MG tablet Take 1 tablet (40 mg total) by mouth daily. <PLEASE MAKE APPOINTMENT FOR REFILLS> 30 tablet 3  . clopidogrel (PLAVIX) 75 MG tablet Take 1 tablet (75 mg total) by mouth daily. 30 tablet 6  . hydrochlorothiazide (HYDRODIURIL) 25 MG tablet Take 1 tablet (25 mg total) by mouth daily. (Patient taking differently: Take 12.5 mg by mouth daily. ) 30 tablet 6  . lisinopril (PRINIVIL,ZESTRIL) 40 MG tablet Take 1 tablet (40 mg total) by mouth daily. 30 tablet 6  . metoprolol (LOPRESSOR) 50 MG tablet TAKE 1 TABLET(50 MG) BY MOUTH TWICE DAILY 60 tablet 11  .  NITROSTAT 0.4 MG SL tablet PLACE 1 TABLET UNDER THE TONGUE EVERY 4 MINUTES AS NEEDED FOR CHEST PAIN. PLEASE MAKE APPOINTMENT 25 tablet 0  . topiramate (TOPAMAX) 50 MG tablet Take 1 tablet (50 mg total) by mouth daily. 90 tablet 3   No facility-administered medications prior to visit.     PAST MEDICAL HISTORY: Past Medical History:  Diagnosis Date  . Allergic rhinitis   . Cancer [facility])    Prostate cancer  . Coronary artery disease   . History of prostate cancer   . Hyperlipidemia   . Hypertension   . Hypokalemia   . Nephritis    as a child  . OSA (obstructive sleep apnea)    cpap machine at home  . Stroke [facility])    "mini stroke" TIA    PAST SURGICAL HISTORY: Past Surgical History:  Procedure Laterality Date  . CARDIAC CATHETERIZATION    . CAROTID STENT  11-08 and 4-11   stent x 3 (12/2006 x 2; 2011 x 1 stent)  . CATARACT EXTRACTION W/PHACO Left 08/21/2014   Procedure: PHACO EMULSION CATARACT EXTRACTION AND INTRAOCULAR LENS PLACEMENT  ([place]) IMPLANT LEFT EYE;  Surgeon: Marylynn Pearson, MD;  Location: [place];  Service: Ophthalmology;  Laterality: Left;  . COLONOSCOPY  2015  . COLONOSCOPY W/ POLYPECTOMY    . CORONARY ANGIOPLASTY    . HERNIA REPAIR Bilateral    x 2   different times  . PROSTATE SURGERY     had cancer; radical resection 1997     FAMILY HISTORY: Family History  Problem Relation Age of Onset  . Colon cancer Father     possibly age 53 y.o diagnosed   . Stroke Mother   . Asthma Sister     SOCIAL HISTORY: Social History   Social History  . Marital status: Married    Spouse name: Hart Robinsons  . Number of children: 3  . Years of education: College   Occupational History  . retired    Social History Main Topics  . Smoking status: Former Smoker    Years: 15.00    Types: Cigarettes    Quit date: 09/08/1985  . Smokeless tobacco: Never Used  . Alcohol use 1.8 oz/week    1 Glasses of wine, 2 Cans of beer per week     Comment: socially  . Drug use: No  . Sexual activity: Not on file   Other Topics Concern  . Not on file   Social History Narrative   3 kids    Retired Arboriculturist    Former smoker quit 1980s. Denies etOH, other drugs    Patient lives at home with family. Patient lives at home with his wife Hart Robinsons)   Caffeine Use: 1 glass of tea every other day     PHYSICAL EXAM  Vitals:   10/01/15 1119  BP: 138/82  Pulse: 68  Resp: 20  Weight: 208 lb (94.3 kg)  Height: 5\' 10"  (1.778 m)   Body mass index is 29.84 kg/m.  Generalized: Well developed, in no acute distress  Head: normocephalic and atraumatic,. Oropharynx benign  Neck: Supple, no carotid bruits  Cardiac: Regular rate rhythm, no murmur  Musculoskeletal: No deformity   Neurological examination   Mentation: Alert oriented to time, place, history taking. Attention span and concentration appropriate. Recent and remote memory intact.  Follows all commands speech and language fluent.   Cranial nerve II-XII: Fundoscopic exam not  done.Pupils were equal round reactive to light extraocular movements were full, visual field  were full on confrontational test. Facial sensation and strength were normal. hearing was intact to finger rubbing bilaterally. Uvula tongue midline. head turning and shoulder shrug were normal and symmetric.Tongue protrusion into cheek strength was normal. Motor: normal bulk and tone, full strength in the BUE, BLE, fine finger movements normal, no pronator drift. No focal weakness Sensory: normal and symmetric to light touch, pinprick, and  Vibration, in the upper and lower extremities Coordination: finger-nose-finger, heel-to-shin bilaterally, no dysmetria Reflexes: 1+ upper lower and symmetric, plantar responses were flexor bilaterally. Gait and Station: Rising up from seated position without assistance, normal stance,  moderate stride, good arm swing, smooth turning, able to perform tiptoe, and heel walking without difficulty. Tandem gait is steady  DIAGNOSTIC DATA (LABS, IMAGING, TESTING) - I reviewed patient records, labs, notes, testing and imaging myself where available.  Lab Results  Component Value Date   WBC 5.1 08/05/2015   HGB 14.1 08/05/2015   HCT 41.3 08/05/2015   MCV 88.2 08/05/2015   PLT 147 08/05/2015      Component Value Date/Time   NA 140 08/05/2015 1050   K 4.4 08/05/2015 1050   CL 107 08/05/2015 1050   CO2 27 08/05/2015 1050   GLUCOSE 78 08/05/2015 1050   BUN 14 08/05/2015 1050   CREATININE 1.04 08/05/2015 1050   CALCIUM 8.3 (L) 08/05/2015 1050   PROT 6.3 08/05/2015 1050   ALBUMIN 3.8 08/05/2015 1050   AST 17 08/05/2015 1050   ALT 13 08/05/2015 1050   ALKPHOS 57 08/05/2015 1050   BILITOT 0.7 08/05/2015 1050   GFRNONAA >60 08/21/2014 0831   GFRAA >60 08/21/2014 0831   Lab Results  Component Value Date   CHOL 110 (L) 08/05/2015   HDL 36 (L) 08/05/2015   LDLCALC 60 08/05/2015   TRIG 70 08/05/2015   CHOLHDL 3.1 08/05/2015    No results found for: VITAMINB12 Lab  Results  Component Value Date   TSH 0.73 08/05/2015      ASSESSMENT AND PLAN 75 year old male with recurrent episodes of transient right body heaviness, weakness as well as funny sensation in the mouth followed by mild headache possible complicated migraines. Doubt TIAs given the stereotypical nature of the symptoms.  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  Frontal and temporal headache likely tension headache. Records reviewed  PLAN:  Continue Plavix for secondary stroke prevention Continue aspirin per Dr. Georgina Peer cardiologist Maintain strict control of hypertension with blood pressure goal below 130/70  Lipids with LDL cholesterol below 70 most recent 60 in June 2017 Continue Topamax 50 mg daily Continue to exercise Continue to eat healthy diet with fresh fruits and vegetables Follow-up in 6 months  Dennie Bible, [facility], [facility], [facility] [address], [place], [place] [address] [phone]

## 2015-10-07 NOTE — Progress Notes (Signed)
I agree with the above plan 

## 2015-10-15 DIAGNOSIS — E559 Vitamin D deficiency, unspecified: Secondary | ICD-10-CM | POA: Diagnosis not present

## 2015-10-15 DIAGNOSIS — I119 Hypertensive heart disease without heart failure: Secondary | ICD-10-CM | POA: Diagnosis not present

## 2015-10-15 DIAGNOSIS — I2510 Atherosclerotic heart disease of native coronary artery without angina pectoris: Secondary | ICD-10-CM | POA: Diagnosis not present

## 2015-10-15 DIAGNOSIS — R609 Edema, unspecified: Secondary | ICD-10-CM | POA: Diagnosis not present

## 2015-10-22 ENCOUNTER — Other Ambulatory Visit: Payer: Self-pay | Admitting: Cardiovascular Disease

## 2015-10-22 NOTE — Telephone Encounter (Signed)
Rx(s) sent to pharmacy electronically.  

## 2015-10-28 ENCOUNTER — Other Ambulatory Visit: Payer: Self-pay | Admitting: Cardiovascular Disease

## 2015-10-28 NOTE — Telephone Encounter (Signed)
Rx request sent to pharmacy.  

## 2016-01-16 DIAGNOSIS — H25811 Combined forms of age-related cataract, right eye: Secondary | ICD-10-CM | POA: Diagnosis not present

## 2016-01-16 DIAGNOSIS — H43813 Vitreous degeneration, bilateral: Secondary | ICD-10-CM | POA: Diagnosis not present

## 2016-02-07 ENCOUNTER — Other Ambulatory Visit: Payer: Self-pay | Admitting: Cardiovascular Disease

## 2016-02-09 NOTE — Telephone Encounter (Signed)
Rx(s) sent to pharmacy electronically.  

## 2016-02-10 ENCOUNTER — Ambulatory Visit (INDEPENDENT_AMBULATORY_CARE_PROVIDER_SITE_OTHER): Payer: Medicare Other | Admitting: Internal Medicine

## 2016-02-10 ENCOUNTER — Ambulatory Visit (INDEPENDENT_AMBULATORY_CARE_PROVIDER_SITE_OTHER): Payer: Medicare Other

## 2016-02-10 ENCOUNTER — Encounter: Payer: Self-pay | Admitting: Internal Medicine

## 2016-02-10 VITALS — BP 128/80 | HR 69 | Ht 70.75 in | Wt 206.0 lb

## 2016-02-10 DIAGNOSIS — Z23 Encounter for immunization: Secondary | ICD-10-CM | POA: Diagnosis not present

## 2016-02-10 DIAGNOSIS — G4733 Obstructive sleep apnea (adult) (pediatric): Secondary | ICD-10-CM

## 2016-02-10 DIAGNOSIS — I2510 Atherosclerotic heart disease of native coronary artery without angina pectoris: Secondary | ICD-10-CM

## 2016-02-10 DIAGNOSIS — R2990 Unspecified symptoms and signs involving the nervous system: Secondary | ICD-10-CM

## 2016-02-10 NOTE — Assessment & Plan Note (Signed)
Describes a fall striking his head one month ago. Mild residual dizziness. Recommended he discuss this with his PCP.

## 2016-02-10 NOTE — Progress Notes (Signed)
Subjective:    Patient ID: Eugene Murray, male    DOB: 1940/10/19, 75 y.o.   MRN: [id number]  HPI M former smoker, Followed for OSA, complicated by HBP, allergic rhinitis, history TIA NPSG 01/01/05- AHI 42.9/hr.    ------------------------------------------------------------------  02/10/2015-75 year old male former smoker followed for OSA, complicated by HBP, allergic rhinitis, history TIA CPAP 14/ [employer] FOLLOWS FOR: Will need new DME (due to insurance) other than [employer]. Wears CPAP every night for about 6 hours. Will need order for supplies as well.  02/10/2016-75 year old male former smoker followed for OSA, complicated by HBP, allergic rhinitis, history TIA CPAP 14/[employer] FOLLOWS FOR: Pt wears CPAP nightly. Pt states that his mask seems to leak and he has to decrease the ramp setting. He thinks he is eligible for new machine and wants mask with better fit. Incidental upper respiratory infection today. In addition to old TIA, he slipped in his back yard and fell flat on his back striking the back of his head one month ago. Somewhat dizzy since then without headache.  ROS-see HPI Constitutional:   No-   weight loss, night sweats, fevers, chills,+fatigue, lassitude. HEENT:   + headaches, no-difficulty swallowing, tooth/dental problems, sore throat,       No-  sneezing, itching, ear ache, nasal congestion, post nasal drip,  CV:  No-   chest pain, orthopnea, PND, swelling in lower extremities, anasarca, dizziness, palpitations Resp: No-   shortness of breath with exertion or at rest.              No-   productive cough,  No non-productive cough,  No- coughing up of blood.              No-   change in color of mucus.  No- wheezing.   Skin: No-   rash or lesions. GI:  No-   heartburn, indigestion, abdominal pain, nausea, vomiting,  GU:  MS:  No-   joint pain or swelling.   Neuro-     "dizzy" Psych:  No- change in mood or affect. No depression or anxiety.  No memory loss.  Objective:   Physical Exam General- Alert, Oriented, Affect-appropriate, Distress- none acute,  Medium build, laconic affect Skin- rash-none, lesions- none, excoriation- none Lymphadenopathy- none Head- atraumatic            Eyes- Gross vision intact, PERRLA, conjunctivae clear secretions            Ears- Hearing, canals            Nose- Clear, Septal dev, mucus, polyps, erosion, perforation             Throat- Mallampati III , mucosa clear , drainage- none, tonsils- atrophic Neck- flexible , trachea midline, no stridor , thyroid nl, carotid no bruit Chest - symmetrical excursion , unlabored           Heart/CV- RRR , no murmur , no gallop  , no rub, nl s1 s2                           - JVD- none , edema- none, stasis changes- none, varices- none           Lung- clear to P&A, wheeze- none, cough- none , dullness-none, rub- none           Chest wall-  Abd-  Br/ Gen/ Rectal- Not done, not indicated Extrem- cyanosis- none, clubbing, none, atrophy- none, strength- nl Neuro- grossly intact  to observation-alert, no nystagmus  Assessment & Plan:

## 2016-02-10 NOTE — Patient Instructions (Signed)
Order- DME [facility] Romans- please evaluate eligibility for replacement of old CPAP machine:  Change to auto 5-15, mask of choice, humidifier, supplies, AirView     Dx OSA  If the dizzy discomfort doesn't clear soon, suggest you ask your primary physician about seeing a Neurologist  Please call if we can help  I do recommend the senior flu shot for you, when ever you make up your mind.

## 2016-02-10 NOTE — Assessment & Plan Note (Signed)
He reports DME indicated eligibility for new CPAP machine. We will take the opportunity to change to AutoPap 5-15 and he can get a new mask. Comfort and compliance goals discussed.

## 2016-03-24 ENCOUNTER — Telehealth: Payer: Self-pay | Admitting: *Deleted

## 2016-03-24 NOTE — Telephone Encounter (Deleted)
r 

## 2016-03-24 NOTE — Telephone Encounter (Signed)
Rn call patient back concerning his dizzy spells. Pt reported he fell out of bed. Pt has call his PCP and has a appt for the dizzy spells on 03/30/2016. He tried to get an appt this week with his PCP but [place]. Pt wanted to know could he get a sooner appt than 04/02/2016 with Carolyn(NP) or Dr. Leonie Man. Rn stated both providers have no availability this week. Pt stated the dizzy spells are not chronic and are not bad. He is able to function daily but gets dizzy at times. Rn stated to patient if they severe he needs to seek the nearest ED or urgent care. Pt verbalized understanding.

## 2016-03-24 NOTE — Telephone Encounter (Deleted)
RN call patient back about his dizzy spells and that he fallen. Pt stated he fell out of bed but did not injured himself. Rn stated did he call his PCP for an evaluation on his dizzy spells. Pt stated he has an appt with his PCP on January 30.Rn stated he also has a 6 month follow up with Carolyn(NP) on 04/02/2016 for stroke and headache follow up. Pt sta

## 2016-03-30 DIAGNOSIS — Z0000 Encounter for general adult medical examination without abnormal findings: Secondary | ICD-10-CM | POA: Diagnosis not present

## 2016-03-30 DIAGNOSIS — Z79899 Other long term (current) drug therapy: Secondary | ICD-10-CM | POA: Diagnosis not present

## 2016-03-30 DIAGNOSIS — R351 Nocturia: Secondary | ICD-10-CM | POA: Diagnosis not present

## 2016-03-30 DIAGNOSIS — I119 Hypertensive heart disease without heart failure: Secondary | ICD-10-CM | POA: Diagnosis not present

## 2016-03-30 DIAGNOSIS — G4733 Obstructive sleep apnea (adult) (pediatric): Secondary | ICD-10-CM | POA: Diagnosis not present

## 2016-03-30 DIAGNOSIS — E559 Vitamin D deficiency, unspecified: Secondary | ICD-10-CM | POA: Diagnosis not present

## 2016-03-30 DIAGNOSIS — I10 Essential (primary) hypertension: Secondary | ICD-10-CM | POA: Diagnosis not present

## 2016-03-30 DIAGNOSIS — I2510 Atherosclerotic heart disease of native coronary artery without angina pectoris: Secondary | ICD-10-CM | POA: Diagnosis not present

## 2016-04-02 ENCOUNTER — Encounter: Payer: Self-pay | Admitting: Nurse Practitioner

## 2016-04-02 ENCOUNTER — Ambulatory Visit (INDEPENDENT_AMBULATORY_CARE_PROVIDER_SITE_OTHER): Payer: Medicare Other | Admitting: Nurse Practitioner

## 2016-04-02 VITALS — BP 120/70 | HR 54 | Ht 70.75 in | Wt 208.8 lb

## 2016-04-02 DIAGNOSIS — E785 Hyperlipidemia, unspecified: Secondary | ICD-10-CM | POA: Diagnosis not present

## 2016-04-02 DIAGNOSIS — Z8673 Personal history of transient ischemic attack (TIA), and cerebral infarction without residual deficits: Secondary | ICD-10-CM | POA: Insufficient documentation

## 2016-04-02 DIAGNOSIS — I10 Essential (primary) hypertension: Secondary | ICD-10-CM | POA: Diagnosis not present

## 2016-04-02 DIAGNOSIS — G4733 Obstructive sleep apnea (adult) (pediatric): Secondary | ICD-10-CM

## 2016-04-02 NOTE — Progress Notes (Signed)
I agree with the above plan 

## 2016-04-02 NOTE — Progress Notes (Signed)
[facility]  PATIENT: Eugene Murray DOB: 1940-10-20   REASON FOR VISIT: History of stroke in 2014, tension headaches HISTORY FROM: Patient    HISTORY OF PRESENT ILLNESS:PSMr. Murray is a 76 year old AA male who been having recurrent stereotypical episodes of right-sided weakness, heaviness and a strange feeling on the roof of the mouth on the right. These have been occurring for the last several weeks But have increased in last 1 week to 2-3 times a day He was admitted to [facility] on 06/30/12 with similar symptoms which had been ongoing off and on for one week. He had noticed some difficulty speaking as well. The symptoms lasted 10-15 minutes with a maximum episode lasting 20 minutes. CT scan was unremarkable. An MRI scan of the brain showed a equivocal tiny acute left pontomedullary junction infarct. MRA of the brain showed mild intracranial atherosclerotic changes. 2DEcho showed normal ejection fraction. Carotid Doppler showed no significant extracranial stenosis. Patient was brought to have a brainstem infarct due to small vessel disease and recurrent symptoms and was advised to add aspirin to his Plavix because of prior history of drug coated cardiac stents. Lipid profile was at goal. Patient states that his symptoms have persisted and despite adding aspirin and he's been having these now to 3 times a day. He does feel just symptoms are stereo typical and he gets an aura with a warning which is a strange feeling on the roof of his mouth on the inside of the right side. This is followed by a feeling of heaviness on the right side. He feels tired and needs to rest. This is followed by a mild headache which lasted for for A few hours. He denies known history of chronic migraines but does states that a few years ago he did have couple of episodes of transient visual distortions which were felt to be migraine variant. He does have some intermittent headaches off and on but he  didn't feel he needed to go to the emergency room or take strong medications for them. He does not describe specific triggers for these episodes. He does have history of sleep apnea and does use a CPAP which was last titrated a year ago and he feels it seems to be working fine. He does also complain of chronic low back pain with some recent increase in pain and some intermittent right leg weakness and ankle giving out. He did have epidural steroid injections in the past but and has no history of back surgery. He is not had any recent imaging studies of the back done.  Update 8/2//2017 CM: Eugene Murray, 76 year old male returns for follow-up after last visit 6 months ago. He continues to do well without recurrent stroke or TIA symptoms. He feels his headaches are about the same  they're less frequent and he rarely needs to take ibuprofen which works quite well. He is tolerating Topamax and denies any short-term memory difficulties. He is currently taking 50 mg daily. He does not wish to taper the medication. His blood pressure in the office today is 138/82. He had recent changes to his HCTZ  due to complaints of dizziness. Patient is quite active and walks a mile a day and goes to the gym for 1-2 hours 3 days a week. He has no other complaints today UPDATE 02/02/2018CM Eugene Murray, 76 year old male returns for follow-up. He has a history of stroke event 2014. He is currently on Plavix and aspirin with no bruising and no bleeding.  He is also on Lipitor without complaints of myalgias. He has a history of obstructive sleep apnea recently got a new machine, he claims he is compliant He also has a history of headaches which are rare and less frequent. He is currently on Topamax. Recent labs done his primary care this week he was told were all normal. He remains active goes to the gym at least 3 days a week. He has not had her stroke TIA events since 2014 he returns for reevaluation  REVIEW OF SYSTEMS: Full 14 system review  of systems performed and notable only for those listed, all others are neg:  Constitutional: neg  Cardiovascular: neg Ear/Nose/Throat: neg  Skin: neg Eyes: neg Respiratory: neg Gastroitestinal: neg  Hematology/Lymphatic: neg  Endocrine: neg Musculoskeletal:neg Allergy/Immunology: neg Neurological: Occasional headache Psychiatric: neg Sleep : Obstructive sleep apnea with CPAP   ALLERGIES: Allergies  Allergen Reactions  . Other Hives    SEAFOOD  . Penicillins Other (See Comments)    "caused me to pass out"  . Sulfa Antibiotics Hives    HOME MEDICATIONS: Outpatient Medications Prior to Visit  Medication Sig Dispense Refill  . amLODipine (NORVASC) 5 MG tablet TAKE 1 TABLET(5 MG) BY MOUTH DAILY 30 tablet 11  . aspirin 81 MG tablet Take 81 mg by mouth every other day.     Marland Kitchen atorvastatin (LIPITOR) 40 MG tablet Take 1 tablet (40 mg total) by mouth daily. 30 tablet 10  . Cholecalciferol (VITAMIN D) 2000 units CAPS Take 2,000 Units by mouth daily.    . clopidogrel (PLAVIX) 75 MG tablet TAKE 1 TABLET(75 MG) BY MOUTH DAILY 30 tablet 11  . hydrochlorothiazide (HYDRODIURIL) 25 MG tablet TAKE 1 TABLET(25 MG) BY MOUTH DAILY (Patient taking differently: TAKE 1/2 TABLET BY MOUTH DAILY) 30 tablet 11  . lisinopril (PRINIVIL,ZESTRIL) 40 MG tablet TAKE 1 TABLET(40 MG) BY MOUTH DAILY 30 tablet 11  . metoprolol (LOPRESSOR) 50 MG tablet TAKE 1 TABLET BY MOUTH TWICE DAILY 180 tablet 2  . NITROSTAT 0.4 MG SL tablet PLACE 1 TABLET UNDER THE TONGUE EVERY 4 MINUTES AS NEEDED FOR CHEST PAIN. PLEASE MAKE APPOINTMENT 25 tablet 0  . topiramate (TOPAMAX) 50 MG tablet Take 1 tablet (50 mg total) by mouth daily. 90 tablet 3   No facility-administered medications prior to visit.     PAST MEDICAL HISTORY: Past Medical History:  Diagnosis Date  . Allergic rhinitis   . Cancer [facility])    Prostate cancer  . Coronary artery disease   . History of prostate cancer   . Hyperlipidemia   . Hypertension   .  Hypokalemia   . Nephritis    as a child  . OSA (obstructive sleep apnea)    cpap machine at home  . Stroke [facility])    "mini stroke" TIA    PAST SURGICAL HISTORY: Past Surgical History:  Procedure Laterality Date  . CARDIAC CATHETERIZATION    . CAROTID STENT  11-08 and 4-11   stent x 3 (12/2006 x 2; 2011 x 1 stent)  . CATARACT EXTRACTION W/PHACO Left 08/21/2014   Procedure: PHACO EMULSION CATARACT EXTRACTION AND INTRAOCULAR LENS PLACEMENT ([place]) IMPLANT LEFT EYE;  Surgeon: Marylynn Pearson, MD;  Location: [place];  Service: Ophthalmology;  Laterality: Left;  . COLONOSCOPY  2015  . COLONOSCOPY W/ POLYPECTOMY    . CORONARY ANGIOPLASTY    . HERNIA REPAIR Bilateral    x 2   different times  . PROSTATE SURGERY     had cancer; radical resection  1997     FAMILY HISTORY: Family History  Problem Relation Age of Onset  . Colon cancer Father     possibly age 38 y.o diagnosed   . Stroke Mother   . Asthma Sister     SOCIAL HISTORY: Social History   Social History  . Marital status: Married    Spouse name: Hart Robinsons  . Number of children: 3  . Years of education: College   Occupational History  . retired    Social History Main Topics  . Smoking status: Former Smoker    Years: 15.00    Types: Cigarettes    Quit date: 09/08/1985  . Smokeless tobacco: Never Used  . Alcohol use 1.8 oz/week    1 Glasses of wine, 2 Cans of beer per week     Comment: socially  . Drug use: No  . Sexual activity: Not on file   Other Topics Concern  . Not on file   Social History Narrative   3 kids    Retired Arboriculturist    Former smoker quit 1980s. Denies etOH, other drugs    Patient lives at home with family. Patient lives at home with his wife Hart Robinsons)   Caffeine Use: 1 glass of tea every other day     PHYSICAL EXAM  Vitals:   04/02/16 1052  BP: 120/70  Pulse: (!) 54  Weight: 208 lb 12.8 oz (94.7 kg)  Height: 5' 10.75" (1.797 m)   Body mass index is 29.33 kg/m.  Generalized: Well developed,  in no acute distress  Head: normocephalic and atraumatic,. Oropharynx benign  Neck: Supple, no carotid bruits  Cardiac: Regular rate rhythm, no murmur  Musculoskeletal: No deformity   Neurological examination   Mentation: Alert oriented to time, place, history taking. Attention span and concentration appropriate. Recent and remote memory intact.  Follows all commands speech and language fluent.   Cranial nerve II-XII: Pupils were equal round reactive to light extraocular movements were full, visual field were full on confrontational test. Facial sensation and strength were normal. hearing was intact to finger rubbing bilaterally. Uvula tongue midline. head turning and shoulder shrug were normal and symmetric.Tongue protrusion into cheek strength was normal. Motor: normal bulk and tone, full strength in the BUE, BLE, fine finger movements normal, no pronator drift. No focal weakness Sensory: normal and symmetric to light touch, pinprick, and  Vibration, in the upper and lower extremities Coordination: finger-nose-finger, heel-to-shin bilaterally, no dysmetria Reflexes: 1+ upper lower and symmetric, plantar responses were flexor bilaterally. Gait and Station: Rising up from seated position without assistance, normal stance,  moderate stride, good arm swing, smooth turning, able to perform tiptoe, and heel walking without difficulty. Tandem gait is steady, no assistive device  DIAGNOSTIC DATA (LABS, IMAGING, TESTING) - I reviewed patient records, labs, notes, testing and imaging myself where available.  Lab Results  Component Value Date   WBC 5.1 08/05/2015   HGB 14.1 08/05/2015   HCT 41.3 08/05/2015   MCV 88.2 08/05/2015   PLT 147 08/05/2015      Component Value Date/Time   NA 140 08/05/2015 1050   K 4.4 08/05/2015 1050   CL 107 08/05/2015 1050   CO2 27 08/05/2015 1050   GLUCOSE 78 08/05/2015 1050   BUN 14 08/05/2015 1050   CREATININE 1.04 08/05/2015 1050   CALCIUM 8.3 (L)  08/05/2015 1050   PROT 6.3 08/05/2015 1050   ALBUMIN 3.8 08/05/2015 1050   AST 17 08/05/2015 1050   ALT 13 08/05/2015 1050  ALKPHOS 57 08/05/2015 1050   BILITOT 0.7 08/05/2015 1050   GFRNONAA >60 08/21/2014 0831   GFRAA >60 08/21/2014 0831   Lab Results  Component Value Date   CHOL 110 (L) 08/05/2015   HDL 36 (L) 08/05/2015   LDLCALC 60 08/05/2015   TRIG 70 08/05/2015   CHOLHDL 3.1 08/05/2015    No results found for: VITAMINB12 Lab Results  Component Value Date   TSH 0.73 08/05/2015      ASSESSMENT AND PLAN 76 year old male with  History of small brainstem infarct May 2014  from small vessel disease with vascular risk factors of sleep apnea, hypertension, hyperlipidemia and coronary artery disease.  Frontal and temporal headache likely tension headache.   PLAN:  Continue Plavix for secondary stroke prevention Continue aspirin per Dr. Georgina Peer cardiologist Maintain strict control of hypertension with blood pressure goal below 130/70 , today's reading 126/70 Lipids with LDL cholesterol below 70 continue Lipitor Decrease  Topamax  to one half tab daily for several weeks then discontinue  Continue to exercise 5 days a week Continue to eat healthy diet with fresh fruits and vegetables Will discharge from neurology service, no further stroke symptoms since 2014 I spent 49min in total face to face time with the patient more than 50% of which was spent counseling and coordination of care, reviewing test results reviewing medications and discussing and reviewing the diagnosis of stroke, neurologically he is stable  Dennie Bible, [facility], [facility], APRN  [facility] [address] [place], [place] [address] [phone]

## 2016-04-02 NOTE — Patient Instructions (Signed)
Continue Plavix for secondary stroke prevention Continue aspirin per Dr. Georgina Peer cardiologist Maintain strict control of hypertension with blood pressure goal below 130/70 , today's reading 126/70 Lipids with LDL cholesterol below 70 continue Lipitor Decrease  Topamax  to one half tab daily for several weeks then discontinue  Continue to exercise 5 days a week Continue to eat healthy diet with fresh fruits and vegetables Will discharge from neurology service

## 2016-07-15 DIAGNOSIS — H43813 Vitreous degeneration, bilateral: Secondary | ICD-10-CM | POA: Diagnosis not present

## 2016-07-15 DIAGNOSIS — H25811 Combined forms of age-related cataract, right eye: Secondary | ICD-10-CM | POA: Diagnosis not present

## 2016-07-15 DIAGNOSIS — H04123 Dry eye syndrome of bilateral lacrimal glands: Secondary | ICD-10-CM | POA: Diagnosis not present

## 2016-09-16 DIAGNOSIS — R6884 Jaw pain: Secondary | ICD-10-CM | POA: Diagnosis not present

## 2016-09-16 DIAGNOSIS — I119 Hypertensive heart disease without heart failure: Secondary | ICD-10-CM | POA: Diagnosis not present

## 2016-09-16 DIAGNOSIS — E785 Hyperlipidemia, unspecified: Secondary | ICD-10-CM | POA: Diagnosis not present

## 2016-09-16 DIAGNOSIS — Z79899 Other long term (current) drug therapy: Secondary | ICD-10-CM | POA: Diagnosis not present

## 2016-09-16 DIAGNOSIS — I2510 Atherosclerotic heart disease of native coronary artery without angina pectoris: Secondary | ICD-10-CM | POA: Diagnosis not present

## 2016-10-09 ENCOUNTER — Other Ambulatory Visit: Payer: Self-pay | Admitting: Cardiovascular Disease

## 2016-11-17 ENCOUNTER — Other Ambulatory Visit: Payer: Self-pay | Admitting: Cardiovascular Disease

## 2016-12-07 ENCOUNTER — Other Ambulatory Visit: Payer: Self-pay | Admitting: Cardiovascular Disease

## 2017-02-10 DIAGNOSIS — H2513 Age-related nuclear cataract, bilateral: Secondary | ICD-10-CM | POA: Diagnosis not present

## 2017-02-10 DIAGNOSIS — H04123 Dry eye syndrome of bilateral lacrimal glands: Secondary | ICD-10-CM | POA: Diagnosis not present

## 2017-03-15 ENCOUNTER — Other Ambulatory Visit: Payer: Self-pay | Admitting: Cardiovascular Disease

## 2017-03-16 NOTE — Telephone Encounter (Signed)
REFILL 

## 2017-04-15 ENCOUNTER — Other Ambulatory Visit: Payer: Self-pay | Admitting: Cardiovascular Disease

## 2017-04-15 NOTE — Telephone Encounter (Signed)
REFILL 

## 2017-05-05 DIAGNOSIS — E785 Hyperlipidemia, unspecified: Secondary | ICD-10-CM | POA: Diagnosis not present

## 2017-05-05 DIAGNOSIS — Z79899 Other long term (current) drug therapy: Secondary | ICD-10-CM | POA: Diagnosis not present

## 2017-05-05 DIAGNOSIS — Z23 Encounter for immunization: Secondary | ICD-10-CM | POA: Diagnosis not present

## 2017-05-05 DIAGNOSIS — E559 Vitamin D deficiency, unspecified: Secondary | ICD-10-CM | POA: Diagnosis not present

## 2017-05-05 DIAGNOSIS — I2510 Atherosclerotic heart disease of native coronary artery without angina pectoris: Secondary | ICD-10-CM | POA: Diagnosis not present

## 2017-05-05 DIAGNOSIS — I119 Hypertensive heart disease without heart failure: Secondary | ICD-10-CM | POA: Diagnosis not present

## 2017-06-10 DIAGNOSIS — R21 Rash and other nonspecific skin eruption: Secondary | ICD-10-CM | POA: Diagnosis not present

## 2017-06-10 DIAGNOSIS — S70262S Insect bite (nonvenomous), left hip, sequela: Secondary | ICD-10-CM | POA: Diagnosis not present

## 2017-07-13 ENCOUNTER — Other Ambulatory Visit: Payer: Self-pay | Admitting: Cardiovascular Disease

## 2017-07-13 NOTE — Telephone Encounter (Signed)
refill 

## 2017-07-13 NOTE — Telephone Encounter (Signed)
Rx(s) sent to pharmacy electronically.  

## 2017-07-22 ENCOUNTER — Other Ambulatory Visit: Payer: Self-pay | Admitting: Cardiovascular Disease

## 2017-07-22 NOTE — Telephone Encounter (Signed)
Rx request sent to pharmacy.  

## 2017-08-10 ENCOUNTER — Ambulatory Visit (INDEPENDENT_AMBULATORY_CARE_PROVIDER_SITE_OTHER): Payer: Medicare Other | Admitting: Cardiovascular Disease

## 2017-08-10 VITALS — BP 132/84 | HR 50 | Ht 70.0 in | Wt 206.0 lb

## 2017-08-10 DIAGNOSIS — G4733 Obstructive sleep apnea (adult) (pediatric): Secondary | ICD-10-CM

## 2017-08-10 DIAGNOSIS — I10 Essential (primary) hypertension: Secondary | ICD-10-CM

## 2017-08-10 DIAGNOSIS — I2510 Atherosclerotic heart disease of native coronary artery without angina pectoris: Secondary | ICD-10-CM

## 2017-08-10 DIAGNOSIS — E785 Hyperlipidemia, unspecified: Secondary | ICD-10-CM

## 2017-08-10 NOTE — Patient Instructions (Signed)

## 2017-08-10 NOTE — Progress Notes (Signed)
Patient ID: Eugene Murray, male   DOB: 1940/06/05, 77 y.o.   MRN: [id number]      HPI: Eugene Murray  is a 77 y.o. male presents to the office today for a 24 month follow-up cardiology evaluation.   Eugene Murray  has a history of hypertension, hyperlipidemia, obstructive sleep apnea on CPAP therapy, history of prostate cancer, and CAD.  In November 2008, he underwent initial PCI to his diagonal vessel and RCA. In April 2011 he was found to have a new 95% stenosis beyond is widely patent RCA stent in the distal RCA at which time a 3.5x12 mm Promus DES stent was inserted. His diagonal stent was patent. He had 20% mid LAD stenosis. In June 2013 a perfusion study was normal. In 2014 he experienced symptoms of weakness involving his right arm with slowness of speech and dysarthria. He was seen by the stroke team. A CT of his head did not show acute intracranial abnormality. MRI showed an equivocal tiny acute left pontomedullary junction stroke. He had mild atherosclerotic changes but otherwise unremarkable. He had seen a neurologist and was started on topiramate. He has been stable neurologically.  Over the past year from a cardiac standpoint, he has remained relatively stable without chest pain.  He goes to the gym several days per week.  He denies any significant episodes of recurrent chest pain.  There have been some short fleeting episodes.  He denies shortness of breath. He denies palpitations. He denies presyncope or syncope. He  has been tolerating atorvastatin 40 mg for his hyperlipidemia.  He denies bleeding with continuation of dual antiplatelet therapy.  He is unaware of any significant blood pressure elevation is current regimen consisting of amlodipine 5 mg, HCTZ 25 mg, lisinopril 40 mg, and metoprolol 50 mg twice a day.    He has obstructive sleep apnea and continues to use CPAP with 100% compliance.  He is unaware of breakthrough snoring.  He denies daytime sleepiness.    Since I last saw him in  June 2017, he denies any recurrent episodes of chest pain, PND orthopnea.  He exercises at least 3 days/week at the [facility] and typically walks 1-1/2 to 2 miles at a time.  He admits to 100% CPAP use.  2 months ago he was bitten by a tick and was treated with doxycycline.  He is seeing Dr. Bryon Lions for primary care.  He had recent laboratory from May 05, 2017.  He presents for evaluation  Past Medical History:  Diagnosis Date  . Allergic rhinitis   . Cancer [facility])    Prostate cancer  . Coronary artery disease   . History of prostate cancer   . Hyperlipidemia   . Hypertension   . Hypokalemia   . Nephritis    as a child  . OSA (obstructive sleep apnea)    cpap machine at home  . Stroke [facility])    "mini stroke" TIA    Past Surgical History:  Procedure Laterality Date  . CARDIAC CATHETERIZATION    . CAROTID STENT  11-08 and 4-11   stent x 3 (12/2006 x 2; 2011 x 1 stent)  . CATARACT EXTRACTION W/PHACO Left 08/21/2014   Procedure: PHACO EMULSION CATARACT EXTRACTION AND INTRAOCULAR LENS PLACEMENT ([place]) IMPLANT LEFT EYE;  Surgeon: Marylynn Pearson, MD;  Location: [place];  Service: Ophthalmology;  Laterality: Left;  . COLONOSCOPY  2015  . COLONOSCOPY W/ POLYPECTOMY    . CORONARY ANGIOPLASTY    . HERNIA REPAIR  Bilateral    x 2   different times  . PROSTATE SURGERY     had cancer; radical resection 1997     Allergies  Allergen Reactions  . Other Hives    SEAFOOD  . Penicillins Other (See Comments)    "caused me to pass out"  . Sulfa Antibiotics Hives    Current Outpatient Medications  Medication Sig Dispense Refill  . amLODipine (NORVASC) 5 MG tablet TAKE 1 TABLET BY MOUTH DAILY 90 tablet 1  . Cholecalciferol (VITAMIN D) 2000 units CAPS Take 2,000 Units by mouth daily.    . hydrochlorothiazide (HYDRODIURIL) 25 MG tablet TAKE 1 TABLET BY MOUTH DAILY 90 tablet 0  . lisinopril (PRINIVIL,ZESTRIL) 40 MG tablet TAKE 1 TABLET BY MOUTH DAILY 90 tablet 2  . metoprolol tartrate (LOPRESSOR) 50  MG tablet Take 1 tablet (50 mg total) by mouth 2 (two) times daily. <PLEASE MAKE APPOINTMENT FOR REFILLS> 60 tablet 0  . NITROSTAT 0.4 MG SL tablet PLACE 1 TABLET UNDER THE TONGUE EVERY 4 MINUTES AS NEEDED FOR CHEST PAIN. PLEASE MAKE APPOINTMENT 25 tablet 0  . VITAMIN D, ERGOCALCIFEROL, PO Take 1 tablet by mouth daily.    Marland Kitchen atorvastatin (LIPITOR) 40 MG tablet TAKE 1 TABLET BY MOUTH DAILY PLEASE MAKE APPOINTMENT FOR REFILLS 30 tablet 11  . clopidogrel (PLAVIX) 75 MG tablet TAKE 1 TABLET BY MOUTH EVERY DAY, MAKE APPT 30 tablet 11   No current facility-administered medications for this visit.     Socially he is married has 3 children and 2 grandchildren. He does drink occasional alcohol. There is no tobacco use. He does exercise occasionally.  ROS General: Negative; No fevers, chills, or night sweats;  HEENT: Negative; No changes in vision or hearing, sinus congestion, difficulty swallowing Pulmonary: Negative; No cough, wheezing, shortness of breath, hemoptysis Cardiovascular: Negative; No chest pain, presyncope, syncope, palpitations GI: Negative; No nausea, vomiting, diarrhea, or abdominal pain GU: History of prostate CA No dysuria, hematuria, or difficulty voiding Musculoskeletal: History of back discomfort intermittently; no myalgias, joint pain, or weakness Hematologic/Oncology: Negative; no easy bruising, bleeding Endocrine: Negative; no heat/cold intolerance; no diabetes Neuro: Negative; no changes in balance, headaches Skin: Negative; No rashes or skin lesions Psychiatric: Negative; No behavioral problems, depression Sleep: Obstructive sleep apnea on CPAP therapy.  He admits to 100% compliance.  No breakthrough snoring, daytime sleepiness, hypersomnolence, bruxism, restless legs, hypnogognic hallucinations, no cataplexy Other comprehensive 14 point system review is negative.  PE BP 132/84 (BP Location: Left Arm, Patient Position: Sitting, Cuff Size: Normal)   Pulse (!) 50   Ht 5'  10" (1.778 m)   Wt 206 lb (93.4 kg)   BMI 29.56 kg/m    Repeat blood pressure today was 122/78  Wt Readings from Last 3 Encounters:  08/10/17 206 lb (93.4 kg)  04/02/16 208 lb 12.8 oz (94.7 kg)  02/10/16 206 lb (93.4 kg)    General: Alert, oriented, no distress.  Skin: normal turgor, no rashes, warm and dry HEENT: Normocephalic, atraumatic. Pupils equal round and reactive to light; sclera anicteric; extraocular muscles intact; Fundi without hemorrhages or exudates.  Discs flat Nose without nasal septal hypertrophy Mouth/Parynx benign; Mallinpatti scale 3 Neck: No JVD, no carotid bruits; normal carotid upstroke Lungs: clear to ausculatation and percussion; no wheezing or rales Chest wall: without tenderness to palpitation Heart: PMI not displaced, RRR, s1 s2 normal, 1/6 systolic murmur, no diastolic murmur, no rubs, gallops, thrills, or heaves Abdomen: soft, nontender; no hepatosplenomehaly, BS+; abdominal aorta nontender and  not dilated by palpation. Back: no CVA tenderness Pulses 2+ Musculoskeletal: full range of motion, normal strength, no joint deformities Extremities: no clubbing cyanosis or edema, Homan's sign negative  Neurologic: grossly nonfocal; Cranial nerves grossly wnl Psychologic: Normal mood and affect   ECG (independently read by me): Sinus Bradycardia 50 bpm.  Borderline criteria for LVH.  Normal intervals.  No ectopy.  June 2017 ECG (independently read by me): Sinus bradycardia 57 bpm.  Nonspecific T-wave in lead 03 March 2014 ECG (independently read by me): Sinus bradycardia at 57 bpm.  No significant ST segment changes.  November 2014 ECG sinus rhythm at 57 beats per minute. PR interval 190 ms QTc interval 377 ms  LABS:  I reviewed recent laboratory from Dr. Bryon Lions from May 05, 2017.  Vitamin D level 55.  Hemoglobin A1c 5.5.  Total cholesterol 118, HDL 37, LDL 70, triglycerides 53 and VLDL 11.  Hemoglobin hematocrit 15.0 and 43.4.  Creatinine  1.07.  Normal LFTs.  BMP Latest Ref Rng & Units 08/05/2015 08/21/2014 03/12/2014  Glucose 65 - 99 mg/dL 78 92 79  BUN 7 - 25 mg/dL '14 11 18  ' Creatinine 0.70 - 1.18 mg/dL 1.04 0.92 0.84  Sodium 135 - 146 mmol/L 140 141 140  Potassium 3.5 - 5.3 mmol/L 4.4 3.5 3.7  Chloride 98 - 110 mmol/L 107 107 104  CO2 20 - 31 mmol/L '27 26 31  ' Calcium 8.6 - 10.3 mg/dL 8.3(L) 9.0 8.6    Hepatic Function Latest Ref Rng & Units 08/05/2015 03/12/2014 06/30/2012  Total Protein 6.1 - 8.1 g/dL 6.3 6.7 7.1  Albumin 3.6 - 5.1 g/dL 3.8 3.9 3.7  AST 10 - 35 U/L '17 15 19  ' ALT 9 - 46 U/L '13 12 14  ' Alk Phosphatase 40 - 115 U/L 57 58 69  Total Bilirubin 0.2 - 1.2 mg/dL 0.7 0.6 0.5    CBC Latest Ref Rng & Units 08/05/2015 08/21/2014 03/12/2014  WBC 3.8 - 10.8 K/uL 5.1 5.8 5.8  Hemoglobin 13.2 - 17.1 g/dL 14.1 15.0 14.5  Hematocrit 38.5 - 50.0 % 41.3 42.3 41.7  Platelets 140 - 400 K/uL 147 145(L) 144(L)    Lab Results  Component Value Date   MCV 88.2 08/05/2015   MCV 85.3 08/21/2014   MCV 87.8 03/12/2014    Lab Results  Component Value Date   TSH 0.73 08/05/2015   Lab Results  Component Value Date   HGBA1C 5.4 07/01/2012   Lipid Panel     Component Value Date/Time   CHOL 110 (L) 08/05/2015 1050   TRIG 70 08/05/2015 1050   HDL 36 (L) 08/05/2015 1050   CHOLHDL 3.1 08/05/2015 1050   VLDL 14 08/05/2015 1050   LDLCALC 60 08/05/2015 1050     RADIOLOGY: Ct Head (brain) Wo Contrast  06/30/2012   *RADIOLOGY REPORT*  Clinical Data: Acute onset right upper and lower extremity weakness.  Right-sided foot drop.  CT HEAD WITHOUT CONTRAST  Technique:  Contiguous axial images were obtained from the base of the skull through the vertex without contrast.  Comparison: None.  Findings: No evidence of acute infarct, acute hemorrhage, mass lesion, mass effect or hydrocephalus.  Minimal periventricular low attenuation.  Small retention cysts or polyps are seen in the left maxillary sinus.  IMPRESSION:  1.  No acute findings. 2.   Minimal chronic microvascular white matter ischemic changes.   Original Report Authenticated By: Lorin Picket, M.D.   Mr Angiogram Head Wo Contrast  06/30/2012   *RADIOLOGY  REPORT*  Clinical Data:  Right-sided weakness.  Hypertension.  History prostate cancer.  MRI BRAIN WITHOUT CONTRAST MRA HEAD WITHOUT CONTRAST  Technique: Multiplanar, multiecho pulse sequences of the brain and surrounding structures were obtained according to standard protocol without intravenous contrast.  Angiographic images of the head were obtained using MRA technique without contrast.  Comparison: 06/30/2012 head CT.  No comparison brain MR.  MRI HEAD  Findings:  Question tiny acute infarct left pontomedullary junction.  Moderate white matter type changes most consistent with result of small vessel disease.  No intracranial hemorrhage.  No hydrocephalus.  No intracranial mass lesion detected on this unenhanced exam.  Major intracranial vascular structures are patent.  Upper cervical cord is of decreased caliber of questionable significance/etiology.  Cervical medullary junction, pituitary region and pineal region unremarkable.  Exophthalmos.  IMPRESSION: Question tiny acute infarct left pontomedullary junction.  Moderate white matter type changes most consistent with result of small vessel disease.  Please see above  MRA HEAD  Findings: Anterior circulation without medium or large size vessel significant stenosis or occlusion.  Middle cerebral artery mild branch vessel irregularity bilaterally.  Mild narrowing distal M1 segment left middle cerebral artery.  Ectatic vertebral arteries and basilar artery.  Nonvisualization left PICA and right AICA.  Mild branch vessel irregularity superior cerebellar artery and posterior cerebral artery bilaterally.  No aneurysm or vascular malformation noted.  IMPRESSION: Mild intracranial atherosclerotic type changes as detailed above the   Original Report Authenticated By: Genia Del, M.D.   Mr  Brain Wo Contrast  06/30/2012   *RADIOLOGY REPORT*  Clinical Data:  Right-sided weakness.  Hypertension.  History prostate cancer.  MRI BRAIN WITHOUT CONTRAST MRA HEAD WITHOUT CONTRAST  Technique: Multiplanar, multiecho pulse sequences of the brain and surrounding structures were obtained according to standard protocol without intravenous contrast.  Angiographic images of the head were obtained using MRA technique without contrast.  Comparison: 06/30/2012 head CT.  No comparison brain MR.  MRI HEAD  Findings:  Question tiny acute infarct left pontomedullary junction.  Moderate white matter type changes most consistent with result of small vessel disease.  No intracranial hemorrhage.  No hydrocephalus.  No intracranial mass lesion detected on this unenhanced exam.  Major intracranial vascular structures are patent.  Upper cervical cord is of decreased caliber of questionable significance/etiology.  Cervical medullary junction, pituitary region and pineal region unremarkable.  Exophthalmos.  IMPRESSION: Question tiny acute infarct left pontomedullary junction.  Moderate white matter type changes most consistent with result of small vessel disease.  Please see above  MRA HEAD  Findings: Anterior circulation without medium or large size vessel significant stenosis or occlusion.  Middle cerebral artery mild branch vessel irregularity bilaterally.  Mild narrowing distal M1 segment left middle cerebral artery.  Ectatic vertebral arteries and basilar artery.  Nonvisualization left PICA and right AICA.  Mild branch vessel irregularity superior cerebellar artery and posterior cerebral artery bilaterally.  No aneurysm or vascular malformation noted.  IMPRESSION: Mild intracranial atherosclerotic type changes as detailed above the   Original Report Authenticated By: Genia Del, M.D.    IMPRESSION: Encounter Diagnoses  Name Primary?  . CAD in native artery Yes  . Essential hypertension   . OSA (obstructive sleep apnea)    . Hyperlipidemia with target LDL less than 70     ASSESSMENT AND PLAN: Mr. Thadeus Gandolfi is a 77 year old African-American male who has CAD and underwent initial percutaneous coronary intervention in November 2008 to his diagonal and RCA.  In  April  2011 he was found to have a new 95% stenosis beyond his distal RCA stent for which a new DES stent was inserted.  His last stress test was in 2013.  Since I last saw him in 2017 he is remained cardiovascularly stable.  He is exercising at least 3 days/week at the [facility] and does both aerobic as well as resistance training.  His blood pressure today is stable on his current regimen consisting of amlodipine 5 mg, HCTZ 12.5 mg, lisinopril 40 mg, and metoprolol 50 mg twice a day.  He continues to be on clopidogrel.  His lipid studies are excellent on atorvastatin 40 mg.  He had a tick bite earlier this year treated with doxycycline.  He is not having anginal symptoms.  He continues to use CPAP with 100% compliance.  He is unaware of breakthrough snoring.  Sleep is restorative.  There is no daytime sleepiness.  I will see him in 1 year for reevaluation.  Time spent: 25 minutes Shelva Majestic 08/11/2017 6:44 PM

## 2017-08-11 ENCOUNTER — Other Ambulatory Visit: Payer: Self-pay | Admitting: Cardiovascular Disease

## 2017-08-11 ENCOUNTER — Encounter: Payer: Self-pay | Admitting: Cardiovascular Disease

## 2017-08-11 NOTE — Telephone Encounter (Signed)
REFILL 

## 2017-08-13 ENCOUNTER — Other Ambulatory Visit: Payer: Self-pay | Admitting: Cardiovascular Disease

## 2017-09-09 ENCOUNTER — Other Ambulatory Visit: Payer: Self-pay | Admitting: Cardiovascular Disease

## 2017-09-15 DIAGNOSIS — H43813 Vitreous degeneration, bilateral: Secondary | ICD-10-CM | POA: Diagnosis not present

## 2017-09-15 DIAGNOSIS — H25811 Combined forms of age-related cataract, right eye: Secondary | ICD-10-CM | POA: Diagnosis not present

## 2017-09-15 DIAGNOSIS — H04123 Dry eye syndrome of bilateral lacrimal glands: Secondary | ICD-10-CM | POA: Diagnosis not present

## 2017-09-23 ENCOUNTER — Other Ambulatory Visit: Payer: Self-pay | Admitting: Cardiovascular Disease

## 2017-09-23 NOTE — Telephone Encounter (Signed)
Rx request sent to pharmacy.  

## 2017-11-21 ENCOUNTER — Other Ambulatory Visit: Payer: Self-pay | Admitting: Cardiovascular Disease

## 2017-11-23 DIAGNOSIS — G4733 Obstructive sleep apnea (adult) (pediatric): Secondary | ICD-10-CM

## 2017-11-23 DIAGNOSIS — I119 Hypertensive heart disease without heart failure: Secondary | ICD-10-CM | POA: Diagnosis not present

## 2017-11-23 DIAGNOSIS — E785 Hyperlipidemia, unspecified: Secondary | ICD-10-CM | POA: Diagnosis not present

## 2017-11-23 DIAGNOSIS — Z79899 Other long term (current) drug therapy: Secondary | ICD-10-CM

## 2017-11-23 DIAGNOSIS — I2510 Atherosclerotic heart disease of native coronary artery without angina pectoris: Secondary | ICD-10-CM | POA: Diagnosis not present

## 2017-11-23 DIAGNOSIS — R413 Other amnesia: Secondary | ICD-10-CM | POA: Diagnosis not present

## 2017-11-23 DIAGNOSIS — K0889 Other specified disorders of teeth and supporting structures: Secondary | ICD-10-CM | POA: Diagnosis not present

## 2017-12-21 ENCOUNTER — Other Ambulatory Visit: Payer: Self-pay | Admitting: Cardiovascular Disease

## 2018-03-15 DIAGNOSIS — H02881 Meibomian gland dysfunction right upper eyelid: Secondary | ICD-10-CM | POA: Diagnosis not present

## 2018-03-15 DIAGNOSIS — H25811 Combined forms of age-related cataract, right eye: Secondary | ICD-10-CM | POA: Diagnosis not present

## 2018-03-15 DIAGNOSIS — H04123 Dry eye syndrome of bilateral lacrimal glands: Secondary | ICD-10-CM | POA: Diagnosis not present

## 2018-03-15 DIAGNOSIS — H2511 Age-related nuclear cataract, right eye: Secondary | ICD-10-CM | POA: Diagnosis not present

## 2018-06-21 ENCOUNTER — Ambulatory Visit: Payer: Medicare Other

## 2018-06-28 ENCOUNTER — Ambulatory Visit: Payer: Medicare Other

## 2018-06-28 ENCOUNTER — Ambulatory Visit: Payer: Medicare Other | Admitting: Internal Medicine

## 2018-07-05 DIAGNOSIS — Z20828 Contact with and (suspected) exposure to other viral communicable diseases: Secondary | ICD-10-CM | POA: Diagnosis not present

## 2018-08-29 ENCOUNTER — Ambulatory Visit (INDEPENDENT_AMBULATORY_CARE_PROVIDER_SITE_OTHER): Payer: Medicare Other

## 2018-08-29 ENCOUNTER — Other Ambulatory Visit: Payer: Self-pay

## 2018-08-29 ENCOUNTER — Ambulatory Visit (INDEPENDENT_AMBULATORY_CARE_PROVIDER_SITE_OTHER): Payer: Medicare Other | Admitting: Internal Medicine

## 2018-08-29 ENCOUNTER — Encounter: Payer: Self-pay | Admitting: Internal Medicine

## 2018-08-29 VITALS — BP 134/80 | HR 52 | Temp 98.6°F | Ht 70.0 in | Wt 207.2 lb

## 2018-08-29 VITALS — BP 134/80 | HR 52 | Temp 98.6°F | Ht 70.0 in | Wt 207.0 lb

## 2018-08-29 DIAGNOSIS — Z0000 Encounter for general adult medical examination without abnormal findings: Secondary | ICD-10-CM | POA: Diagnosis not present

## 2018-08-29 DIAGNOSIS — I119 Hypertensive heart disease without heart failure: Secondary | ICD-10-CM | POA: Diagnosis not present

## 2018-08-29 DIAGNOSIS — I10 Essential (primary) hypertension: Secondary | ICD-10-CM | POA: Diagnosis not present

## 2018-08-29 DIAGNOSIS — E7800 Pure hypercholesterolemia, unspecified: Secondary | ICD-10-CM

## 2018-08-29 DIAGNOSIS — E663 Overweight: Secondary | ICD-10-CM

## 2018-08-29 DIAGNOSIS — I2510 Atherosclerotic heart disease of native coronary artery without angina pectoris: Secondary | ICD-10-CM

## 2018-08-29 DIAGNOSIS — Z23 Encounter for immunization: Secondary | ICD-10-CM

## 2018-08-29 LAB — POCT URINALYSIS DIPSTICK
Bilirubin, UA: NEGATIVE
Blood, UA: NEGATIVE
Glucose, UA: NEGATIVE
Ketones, UA: NEGATIVE
Leukocytes, UA: NEGATIVE
Nitrite, UA: NEGATIVE
Protein, UA: NEGATIVE
Spec Grav, UA: >=1.03 — AB (ref 1.010–1.025)
Urobilinogen, UA: 0.2 E.U./dL
pH, UA: 5.5 (ref 5.0–8.0)

## 2018-08-29 LAB — POCT UA - MICROALBUMIN
Albumin/Creatinine Ratio, Urine, POC: <30
Creatinine, POC: 300 mg/dL
Microalbumin Ur, POC: 10 mg/L

## 2018-08-29 MED ORDER — HYDROCHLOROTHIAZIDE 12.5 MG PO CAPS: 12.5000 mg | ORAL_CAPSULE | Freq: Every day | ORAL | 2 refills | Status: DC

## 2018-08-29 MED ORDER — PREVNAR 13 IM SUSP: 0.5000 mL | INTRAMUSCULAR | 0 refills | Status: AC

## 2018-08-29 NOTE — Progress Notes (Signed)
Subjective:   Eugene Murray is a 78 y.o. male who presents for Medicare Annual/Subsequent preventive examination.  Review of Systems:  n/a Cardiac Risk Factors include: advanced age (>22men, >55 women);hypertension;dyslipidemia;sedentary lifestyle     Objective:    Vitals: BP 134/80 (BP Location: Left Arm, Patient Position: Sitting)   Pulse (!) 52   Temp 98.6 F (37 C) (Oral)   Ht 5\' 10"  (1.778 m)   Wt 207 lb (93.9 kg)   BMI 29.70 kg/m   Body mass index is 29.7 kg/m.  Advanced Directives 08/29/2018 03/19/2015 08/21/2014 06/30/2012  Does Patient Have a Medical Advance Directive? No No No Patient does not have advance directive;Patient would not like information  Would patient like information on creating a medical advance directive? No - Patient declined No - patient declined information Yes - Scientist, clinical (histocompatibility and immunogenetics) given -    Tobacco Social History   Tobacco Use  Smoking Status Former Smoker  . Years: 15.00  . Types: Cigarettes  . Quit date: 09/08/1985  . Years since quitting: 32.9  Smokeless Tobacco Never Used     Counseling given: Not Answered   Clinical Intake:  Pre-visit preparation completed: Yes  Pain : No/denies pain Pain Score: 0-No pain     Nutritional Status: BMI 25 -29 Overweight Nutritional Risks: None Diabetes: No  How often do you need to have someone help you when you read instructions, pamphlets, or other written materials from your doctor or pharmacy?: 1 - Never What is the last grade level you completed in school?: some college  Interpreter Needed?: No  Information entered by :: NAllen  LPN  Past Medical History:  Diagnosis Date  . Allergic rhinitis   . Cancer [facility])    Prostate cancer  . Coronary artery disease   . History of prostate cancer   . Hyperlipidemia   . Hypertension   . Hypokalemia   . Nephritis    as a child  . OSA (obstructive sleep apnea)    cpap machine at home  . Stroke [facility])    "mini stroke" TIA   Past  Surgical History:  Procedure Laterality Date  . CARDIAC CATHETERIZATION    . CAROTID STENT  11-08 and 4-11   stent x 3 (12/2006 x 2; 2011 x 1 stent)  . CATARACT EXTRACTION W/PHACO Left 08/21/2014   Procedure: PHACO EMULSION CATARACT EXTRACTION AND INTRAOCULAR LENS PLACEMENT ([place]) IMPLANT LEFT EYE;  Surgeon: Marylynn Pearson, MD;  Location: [place];  Service: Ophthalmology;  Laterality: Left;  . COLONOSCOPY  2015  . COLONOSCOPY W/ POLYPECTOMY    . CORONARY ANGIOPLASTY    . HERNIA REPAIR Bilateral    x 2   different times  . PROSTATE SURGERY     had cancer; radical resection 1997    Family History  Problem Relation Age of Onset  . Colon cancer Father        possibly age 3 y.o diagnosed   . Stroke Mother   . Asthma Sister    Social History   Socioeconomic History  . Marital status: Married    Spouse name: Eugene Murray  . Number of children: 3  . Years of education: College  . Highest education level: Not on file  Occupational History  . Occupation: retired  Scientific laboratory technician  . Financial resource strain: Not hard at all  . Food insecurity    Worry: Never true    Inability: Never true  . Transportation needs    Medical: No  Non-medical: No  Tobacco Use  . Smoking status: Former Smoker    Years: 15.00    Types: Cigarettes    Quit date: 09/08/1985    Years since quitting: 32.9  . Smokeless tobacco: Never Used  Substance and Sexual Activity  . Alcohol use: Not Currently    Comment: seldom  . Drug use: No  . Sexual activity: Yes  Lifestyle  . Physical activity    Days per week: 0 days    Minutes per session: 0 min  . Stress: Not at all  Relationships  . Social Herbalist on phone: Not on file    Gets together: Not on file    Attends religious service: Not on file    Active member of club or organization: Not on file    Attends meetings of clubs or organizations: Not on file    Relationship status: Not on file  Other Topics Concern  . Not on file  Social History  Narrative   3 kids    Retired Arboriculturist    Former smoker quit 1980s. Denies etOH, other drugs    Patient lives at home with family. Patient lives at home with his wife Eugene Murray)   Caffeine Use: 1 glass of tea every other day    Outpatient Encounter Medications as of 08/29/2018  Medication Sig  . amLODipine (NORVASC) 5 MG tablet TAKE 1 TABLET BY MOUTH DAILY  . atorvastatin (LIPITOR) 40 MG tablet TAKE 1 TABLET BY MOUTH DAILY PLEASE MAKE APPOINTMENT FOR REFILLS  . Cholecalciferol (VITAMIN D) 2000 units CAPS Take 2,000 Units by mouth daily.  . clopidogrel (PLAVIX) 75 MG tablet TAKE 1 TABLET(75 MG) BY MOUTH DAILY  . hydrochlorothiazide (MICROZIDE) 12.5 MG capsule Take 1 capsule (12.5 mg total) by mouth daily.  Marland Kitchen lisinopril (PRINIVIL,ZESTRIL) 40 MG tablet TAKE 1 TABLET BY MOUTH DAILY  . metoprolol tartrate (LOPRESSOR) 50 MG tablet TAKE 1 TABLET BY MOUTH TWICE DAILY. PLEASE MAKE APPOINTMENT FOR REFILLS  . NITROSTAT 0.4 MG SL tablet PLACE 1 TABLET UNDER THE TONGUE EVERY 4 MINUTES AS NEEDED FOR CHEST PAIN. PLEASE MAKE APPOINTMENT   No facility-administered encounter medications on file as of 08/29/2018.     Activities of Daily Living In your present state of health, do you have any difficulty performing the following activities: 08/29/2018  Hearing? N  Vision? Y  Comment sees a little cloudy and floaters at times  Difficulty concentrating or making decisions? N  Walking or climbing stairs? N  Dressing or bathing? N  Doing errands, shopping? N  Preparing Food and eating ? N  Using the Toilet? N  In the past six months, have you accidently leaked urine? Y  Do you have problems with loss of bowel control? N  Managing your Medications? N  Managing your Finances? N  Housekeeping or managing your Housekeeping? N  Some recent data might be hidden    Patient Care Team: [name], MD as PCP - General (Internal Medicine)   Assessment:   This is a routine wellness examination for [place].   Exercise Activities and Dietary recommendations Current Exercise Habits: The patient does not participate in regular exercise at present  Goals    . Exercise 150 min/wk Moderate Activity       Fall Risk Fall Risk  08/29/2018 08/29/2018  Falls in the past year? 0 0  Follow up Falls evaluation completed;Education provided;Falls prevention discussed -   Is the patient's home free of loose throw rugs in walkways,  pet beds, electrical cords, etc?   yes      Grab bars in the bathroom? no      Handrails on the stairs?   yes      Adequate lighting?   yes  Timed Get Up and Go Performed: n/a  Depression Screen PHQ 2/9 Scores 08/29/2018 08/29/2018  PHQ - 2 Score 0 0  PHQ- 9 Score 0 -    Cognitive Function     6CIT Screen 08/29/2018  What Year? 0 points  What month? 0 points  What time? 0 points  Count back from 20 0 points  Months in reverse 0 points  Repeat phrase 0 points  Total Score 0    Immunization History  Administered Date(s) Administered  . Influenza,inj,Quad PF,6+ Mos 12/05/2012, 12/06/2013, 02/10/2015, 02/10/2016  . Zoster 01/10/2013    Qualifies for Shingles Vaccine? yes  Screening Tests Health Maintenance  Topic Date Due  . PNA vac Low Risk Adult (2 of 2 - PPSV23) 05/06/2018  . INFLUENZA VACCINE  09/30/2018  . TETANUS/TDAP  04/01/2026   Cancer Screenings: Lung: Low Dose CT Chest recommended if Age 52-80 years, 30 pack-year currently smoking OR have quit w/in 15years. Patient does not qualify. Colorectal: not required  Additional Screenings:  Hepatitis C Screening:n/a      Plan:    6 CIT was 0. Prevnar 13 sent to pharmacy. Wants to exercise regularly.  I have personally reviewed and noted the following in the patient's chart:   . Medical and social history . Use of alcohol, tobacco or illicit drugs  . Current medications and supplements . Functional ability and status . Nutritional status . Physical activity . Advanced directives . List of other  physicians . Hospitalizations, surgeries, and ER visits in previous 12 months . Vitals . Screenings to include cognitive, depression, and falls . Referrals and appointments  In addition, I have reviewed and discussed with patient certain preventive protocols, quality metrics, and best practice recommendations. A written personalized care plan for preventive services as well as general preventive health recommendations were provided to patient.     Kellie Simmering, LPN  [id number]

## 2018-08-29 NOTE — Patient Instructions (Addendum)
Eugene Murray , Thank you for taking time to come for your Medicare Wellness Visit. I appreciate your ongoing commitment to your health goals. Please review the following plan we discussed and let me know if I can assist you in the future.   Screening recommendations/referrals: Colonoscopy: not required Recommended yearly ophthalmology/optometry visit for glaucoma screening and checkup Recommended yearly dental visit for hygiene and checkup  Vaccinations: Influenza vaccine: decline Pneumococcal vaccine: 04/2017 Tdap vaccine: 04/2016 Shingles vaccine: discussed    Advanced directives: Advance directive discussed with you today. Even though you declined this today please call our office should you change your mind and we can give you the proper paperwork for you to fill out.   Conditions/risks identified: overweight  Next appointment: 02/20/2019 at 11:00  Preventive Care 60 Years and Older, Male Preventive care refers to lifestyle choices and visits with your health care provider that can promote health and wellness. What does preventive care include?  A yearly physical exam. This is also called an annual well check.  Dental exams once or twice a year.  Routine eye exams. Ask your health care provider how often you should have your eyes checked.  Personal lifestyle choices, including:  Daily care of your teeth and gums.  Regular physical activity.  Eating a healthy diet.  Avoiding tobacco and drug use.  Limiting alcohol use.  Practicing safe sex.  Taking low doses of aspirin every day.  Taking vitamin and mineral supplements as recommended by your health care provider. What happens during an annual well check? The services and screenings done by your health care provider during your annual well check will depend on your age, overall health, lifestyle risk factors, and family history of disease. Counseling  Your health care provider may ask you questions about your:   Alcohol use.  Tobacco use.  Drug use.  Emotional well-being.  Home and relationship well-being.  Sexual activity.  Eating habits.  History of falls.  Memory and ability to understand (cognition).  Work and work Statistician. Screening  You may have the following tests or measurements:  Height, weight, and BMI.  Blood pressure.  Lipid and cholesterol levels. These may be checked every 5 years, or more frequently if you are over 79 years old.  Skin check.  Lung cancer screening. You may have this screening every year starting at age 70 if you have a 30-pack-year history of smoking and currently smoke or have quit within the past 15 years.  Fecal occult blood test (FOBT) of the stool. You may have this test every year starting at age 78.  Flexible sigmoidoscopy or colonoscopy. You may have a sigmoidoscopy every 5 years or a colonoscopy every 10 years starting at age 49.  Prostate cancer screening. Recommendations will vary depending on your family history and other risks.  Hepatitis C blood test.  Hepatitis B blood test.  Sexually transmitted disease (STD) testing.  Diabetes screening. This is done by checking your blood sugar (glucose) after you have not eaten for a while (fasting). You may have this done every 1-3 years.  Abdominal aortic aneurysm (AAA) screening. You may need this if you are a current or former smoker.  Osteoporosis. You may be screened starting at age 62 if you are at high risk. Talk with your health care provider about your test results, treatment options, and if necessary, the need for more tests. Vaccines  Your health care provider may recommend certain vaccines, such as:  Influenza vaccine. This is recommended every  year.  Tetanus, diphtheria, and acellular pertussis (Tdap, Td) vaccine. You may need a Td booster every 10 years.  Zoster vaccine. You may need this after age 42.  Pneumococcal 13-valent conjugate (PCV13) vaccine. One dose is  recommended after age 19.  Pneumococcal polysaccharide (PPSV23) vaccine. One dose is recommended after age 72. Talk to your health care provider about which screenings and vaccines you need and how often you need them. This information is not intended to replace advice given to you by your health care provider. Make sure you discuss any questions you have with your health care provider. Document Released: 03/14/2015 Document Revised: 11/05/2015 Document Reviewed: 12/17/2014 Elsevier Interactive Patient Education  2017 [employer] Prevention in the Home Falls can cause injuries. They can happen to people of all ages. There are many things you can do to make your home safe and to help prevent falls. What can I do on the outside of my home?  Regularly fix the edges of walkways and driveways and fix any cracks.  Remove anything that might make you trip as you walk through a door, such as a raised step or threshold.  Trim any bushes or trees on the path to your home.  Use bright outdoor lighting.  Clear any walking paths of anything that might make someone trip, such as rocks or tools.  Regularly check to see if handrails are loose or broken. Make sure that both sides of any steps have handrails.  Any raised decks and porches should have guardrails on the edges.  Have any leaves, snow, or ice cleared regularly.  Use sand or salt on walking paths during winter.  Clean up any spills in your garage right away. This includes oil or grease spills. What can I do in the bathroom?  Use night lights.  Install grab bars by the toilet and in the tub and shower. Do not use towel bars as grab bars.  Use non-skid mats or decals in the tub or shower.  If you need to sit down in the shower, use a plastic, non-slip stool.  Keep the floor dry. Clean up any water that spills on the floor as soon as it happens.  Remove soap buildup in the tub or shower regularly.  Attach bath mats  securely with double-sided non-slip rug tape.  Do not have throw rugs and other things on the floor that can make you trip. What can I do in the bedroom?  Use night lights.  Make sure that you have a light by your bed that is easy to reach.  Do not use any sheets or blankets that are too big for your bed. They should not hang down onto the floor.  Have a firm chair that has side arms. You can use this for support while you get dressed.  Do not have throw rugs and other things on the floor that can make you trip. What can I do in the kitchen?  Clean up any spills right away.  Avoid walking on wet floors.  Keep items that you use a lot in easy-to-reach places.  If you need to reach something above you, use a strong step stool that has a grab bar.  Keep electrical cords out of the way.  Do not use floor polish or wax that makes floors slippery. If you must use wax, use non-skid floor wax.  Do not have throw rugs and other things on the floor that can make you trip. What can  I do with my stairs?  Do not leave any items on the stairs.  Make sure that there are handrails on both sides of the stairs and use them. Fix handrails that are broken or loose. Make sure that handrails are as long as the stairways.  Check any carpeting to make sure that it is firmly attached to the stairs. Fix any carpet that is loose or worn.  Avoid having throw rugs at the top or bottom of the stairs. If you do have throw rugs, attach them to the floor with carpet tape.  Make sure that you have a light switch at the top of the stairs and the bottom of the stairs. If you do not have them, ask someone to add them for you. What else can I do to help prevent falls?  Wear shoes that:  Do not have high heels.  Have rubber bottoms.  Are comfortable and fit you well.  Are closed at the toe. Do not wear sandals.  If you use a stepladder:  Make sure that it is fully opened. Do not climb a closed  stepladder.  Make sure that both sides of the stepladder are locked into place.  Ask someone to hold it for you, if possible.  Clearly mark and make sure that you can see:  Any grab bars or handrails.  First and last steps.  Where the edge of each step is.  Use tools that help you move around (mobility aids) if they are needed. These include:  Canes.  Walkers.  Scooters.  Crutches.  Turn on the lights when you go into a dark area. Replace any light bulbs as soon as they burn out.  Set up your furniture so you have a clear path. Avoid moving your furniture around.  If any of your floors are uneven, fix them.  If there are any pets around you, be aware of where they are.  Review your medicines with your doctor. Some medicines can make you feel dizzy. This can increase your chance of falling. Ask your doctor what other things that you can do to help prevent falls. This information is not intended to replace advice given to you by your health care provider. Make sure you discuss any questions you have with your health care provider. Document Released: 12/12/2008 Document Revised: 07/24/2015 Document Reviewed: 03/22/2014 Elsevier Interactive Patient Education  2017 [employer].

## 2018-08-30 LAB — LIPID PANEL
Chol/HDL Ratio: 3.1 ratio (ref 0.0–5.0)
Cholesterol, Total: 122 mg/dL (ref 100–199)
HDL: 39 mg/dL — ABNORMAL LOW (ref >39)
LDL Calculated: 70 mg/dL (ref 0–99)
Triglycerides: 67 mg/dL (ref 0–149)
VLDL Cholesterol Cal: 13 mg/dL (ref 5–40)

## 2018-08-30 LAB — CMP14+EGFR
ALT: 18 IU/L (ref 0–44)
AST: 17 IU/L (ref 0–40)
Albumin/Globulin Ratio: 1.8 (ref 1.2–2.2)
Albumin: 4.2 g/dL (ref 3.7–4.7)
Alkaline Phosphatase: 78 IU/L (ref 39–117)
BUN/Creatinine Ratio: 12 (ref 10–24)
BUN: 13 mg/dL (ref 8–27)
Bilirubin Total: 0.7 mg/dL (ref 0.0–1.2)
CO2: 27 mmol/L (ref 20–29)
Calcium: 9 mg/dL (ref 8.6–10.2)
Chloride: 103 mmol/L (ref 96–106)
Creatinine, Ser: 1.06 mg/dL (ref 0.76–1.27)
GFR calc Af Amer: 77 mL/min/{1.73_m2} (ref >59)
GFR calc non Af Amer: 67 mL/min/{1.73_m2} (ref >59)
Globulin, Total: 2.3 g/dL (ref 1.5–4.5)
Glucose: 68 mg/dL (ref 65–99)
Potassium: 3.5 mmol/L (ref 3.5–5.2)
Sodium: 143 mmol/L (ref 134–144)
Total Protein: 6.5 g/dL (ref 6.0–8.5)

## 2018-09-05 ENCOUNTER — Other Ambulatory Visit: Payer: Self-pay | Admitting: Cardiovascular Disease

## 2018-09-05 NOTE — Telephone Encounter (Signed)
Rx(s) sent to pharmacy electronically.  

## 2018-09-06 ENCOUNTER — Other Ambulatory Visit: Payer: Self-pay | Admitting: Cardiovascular Disease

## 2018-09-10 NOTE — Progress Notes (Signed)
Subjective:     Patient ID: Eugene Murray , male    DOB: 04-Oct-1940 , 78 y.o.   MRN: [id number]   Chief Complaint  Patient presents with  . Hypertension  . Hyperlipidemia    HPI  Hypertension This is a chronic problem. The current episode started more than 1 year ago. The problem has been gradually improving since onset. The problem is controlled. Risk factors for coronary artery disease include dyslipidemia and male gender. The current treatment provides moderate improvement.  Hyperlipidemia     Past Medical History:  Diagnosis Date  . Allergic rhinitis   . Cancer [facility])    Prostate cancer  . Coronary artery disease   . History of prostate cancer   . Hyperlipidemia   . Hypertension   . Hypokalemia   . Nephritis    as a child  . OSA (obstructive sleep apnea)    cpap machine at home  . Stroke [facility])    "mini stroke" TIA     Family History  Problem Relation Age of Onset  . Colon cancer Father        possibly age 27 y.o diagnosed   . Stroke Mother   . Asthma Sister      Current Outpatient Medications:  .  amLODipine (NORVASC) 5 MG tablet, TAKE 1 TABLET BY MOUTH DAILY, Disp: 90 tablet, Rfl: 2 .  atorvastatin (LIPITOR) 40 MG tablet, TAKE 1 TABLET BY MOUTH DAILY PLEASE MAKE APPOINTMENT FOR REFILLS, Disp: 30 tablet, Rfl: 11 .  clopidogrel (PLAVIX) 75 MG tablet, TAKE 1 TABLET(75 MG) BY MOUTH DAILY, Disp: 90 tablet, Rfl: 2 .  lisinopril (PRINIVIL,ZESTRIL) 40 MG tablet, TAKE 1 TABLET BY MOUTH DAILY, Disp: 90 tablet, Rfl: 3 .  NITROSTAT 0.4 MG SL tablet, PLACE 1 TABLET UNDER THE TONGUE EVERY 4 MINUTES AS NEEDED FOR CHEST PAIN. PLEASE MAKE APPOINTMENT, Disp: 25 tablet, Rfl: 0 .  Cholecalciferol (VITAMIN D) 2000 units CAPS, Take 2,000 Units by mouth daily., Disp: , Rfl:  .  hydrochlorothiazide (MICROZIDE) 12.5 MG capsule, Take 1 capsule (12.5 mg total) by mouth daily., Disp: 90 capsule, Rfl: 2 .  metoprolol tartrate (LOPRESSOR) 50 MG tablet, TAKE 1 TABLET(50 MG) BY MOUTH TWICE  DAILY. NEED TO MAKE AN APPOINTMENT FOR FUTURE, Disp: 120 tablet, Rfl: 0   Allergies  Allergen Reactions  . Other Hives    SEAFOOD  . Penicillins Other (See Comments)    "caused me to pass out"  . Sulfa Antibiotics Hives     Review of Systems  Constitutional: Negative.   Respiratory: Negative.   Cardiovascular: Negative.   Gastrointestinal: Negative.   Neurological: Negative.   Psychiatric/Behavioral: Negative.      Today's Vitals   08/29/18 1034  BP: 134/80  Pulse: (!) 52  Temp: 98.6 F (37 C)  TempSrc: Oral  Weight: 207 lb 3.2 oz (94 kg)  Height: 5' 10"  (1.778 m)   Body mass index is 29.73 kg/m.   Objective:  Physical Exam Vitals signs and nursing note reviewed.  Constitutional:      Appearance: Normal appearance.  Cardiovascular:     Rate and Rhythm: Normal rate and regular rhythm.     Heart sounds: Normal heart sounds.  Pulmonary:     Effort: Pulmonary effort is normal.     Breath sounds: Normal breath sounds.  Skin:    General: Skin is warm.  Neurological:     General: No focal deficit present.     Mental Status: He is alert.  Psychiatric:        Mood and Affect: Mood normal.         Assessment And Plan:     1. Benign hypertensive heart disease without heart failure  Chronic. Fair control. He is aware that goal bp is less than 130/80. He is encouraged to avoid adding salt to his foods. He will rto in six months for re-evaluation.   - CMP14+EGFR  2. Pure hypercholesterolemia  Chronic. I will check fasting lipid panel and LFTs. He is encouraged to avoid fried foods.   - Lipid panel  3. Atherosclerosis of native coronary artery of native heart without angina pectoris  Chronic, yet stable. Importance of dietary AND medication AND exercise compliance was discussed with the patient. He is also followed by Cardiology on a yearly basis.   4. Overweight (BMI 25.0-29.9)  Importance of achieving optimal weight to decrease risk of cardiovascular  disease and cancers was discussed with the patient in full detail. He is encouraged to start slowly - start with 10 minutes twice daily at least three to four days per week and to gradually build to 30 minutes five days weekly. He was given tips to incorporate more activity into her daily routine - take stairs when possible, park farther away from grocery stores, etc.    Maximino Greenland, MD    THE PATIENT IS ENCOURAGED TO PRACTICE SOCIAL DISTANCING DUE TO THE COVID-19 PANDEMIC.

## 2018-09-13 ENCOUNTER — Other Ambulatory Visit: Payer: Self-pay | Admitting: Cardiovascular Disease

## 2018-09-13 DIAGNOSIS — H25811 Combined forms of age-related cataract, right eye: Secondary | ICD-10-CM | POA: Diagnosis not present

## 2018-09-13 DIAGNOSIS — H04123 Dry eye syndrome of bilateral lacrimal glands: Secondary | ICD-10-CM | POA: Diagnosis not present

## 2018-10-13 ENCOUNTER — Other Ambulatory Visit: Payer: Self-pay | Admitting: Cardiovascular Disease

## 2018-11-13 ENCOUNTER — Other Ambulatory Visit: Payer: Self-pay

## 2018-11-17 ENCOUNTER — Other Ambulatory Visit: Payer: Self-pay | Admitting: Cardiovascular Disease

## 2018-11-17 MED ORDER — AMLODIPINE BESYLATE 5 MG PO TABS: ORAL_TABLET | ORAL | 0 refills | Status: DC

## 2018-11-17 MED ORDER — METOPROLOL TARTRATE 50 MG PO TABS: ORAL_TABLET | ORAL | 0 refills | Status: DC

## 2018-11-17 NOTE — Telephone Encounter (Signed)
° ° °*  STAT* If patient is at the pharmacy, call can be transferred to refill team.   1. Which medications need to be refilled? (please list name of each medication and dose if known) amLODipine (NORVASC) 5 MG tablet metoprolol tartrate (LOPRESSOR) 50 MG tablet  2. Which pharmacy/location (including street and city if local pharmacy) is medication to be sent to? [place], [place] - [address] AT [address]  3. Do they need a 30 day or 90 day supply? [place]

## 2018-11-20 ENCOUNTER — Other Ambulatory Visit: Payer: Self-pay

## 2018-11-20 MED ORDER — METOPROLOL TARTRATE 50 MG PO TABS: ORAL_TABLET | ORAL | 0 refills | Status: DC

## 2018-11-22 ENCOUNTER — Other Ambulatory Visit: Payer: Self-pay | Admitting: Cardiovascular Disease

## 2018-11-29 ENCOUNTER — Other Ambulatory Visit: Payer: Self-pay | Admitting: Cardiovascular Disease

## 2018-12-08 DIAGNOSIS — Z20828 Contact with and (suspected) exposure to other viral communicable diseases: Secondary | ICD-10-CM | POA: Diagnosis not present

## 2018-12-11 ENCOUNTER — Encounter: Payer: Self-pay | Admitting: Physician Assistant

## 2018-12-11 ENCOUNTER — Other Ambulatory Visit: Payer: Self-pay

## 2018-12-11 ENCOUNTER — Ambulatory Visit (INDEPENDENT_AMBULATORY_CARE_PROVIDER_SITE_OTHER): Payer: Medicare Other | Admitting: General Practice

## 2018-12-11 VITALS — BP 140/82 | HR 55 | Temp 98.0°F | Ht 70.0 in | Wt 205.0 lb

## 2018-12-11 DIAGNOSIS — G4733 Obstructive sleep apnea (adult) (pediatric): Secondary | ICD-10-CM

## 2018-12-11 DIAGNOSIS — E785 Hyperlipidemia, unspecified: Secondary | ICD-10-CM | POA: Diagnosis not present

## 2018-12-11 DIAGNOSIS — I2510 Atherosclerotic heart disease of native coronary artery without angina pectoris: Secondary | ICD-10-CM | POA: Diagnosis not present

## 2018-12-11 DIAGNOSIS — I10 Essential (primary) hypertension: Secondary | ICD-10-CM

## 2018-12-11 MED ORDER — AMLODIPINE BESYLATE 10 MG PO TABS: 10.0000 mg | ORAL_TABLET | Freq: Every day | ORAL | 1 refills | Status: DC

## 2018-12-11 NOTE — Patient Instructions (Signed)
Medication Instructions:   Increase Amlodipine to 10 mg daily.  If you need a refill on your cardiac medications before your next appointment, please call your pharmacy.   Follow-Up: At [facility], you and your health needs are our priority.  As part of our continuing mission to provide you with exceptional heart care, we have created designated Provider Care Teams.  These Care Teams include your primary Cardiologist (physician) and Advanced Practice Providers (APPs -  Physician Assistants and Nurse Practitioners) who all work together to provide you with the care you need, when you need it. . Please schedule a follow-up appointment in 2 weeks with Coletta Memos, NP.

## 2018-12-11 NOTE — Progress Notes (Addendum)
Cardiology Clinic Note   Patient Name: Eugene Murray Date of Encounter: 12/11/2018  Primary Care Provider:  [name], MD Primary Cardiologist:  Shelva Majestic, MD  Patient Profile    Eugene Murray 78 year old male presents today for follow-up for his hypertension, hyperlipidemia, and obstructive sleep apnea.  Past Medical History    Past Medical History:  Diagnosis Date  . Allergic rhinitis   . Cancer [facility])    Prostate cancer  . Coronary artery disease   . History of prostate cancer   . Hyperlipidemia   . Hypertension   . Hypokalemia   . Nephritis    as a child  . OSA (obstructive sleep apnea)    cpap machine at home  . Stroke [facility])    "mini stroke" TIA   Past Surgical History:  Procedure Laterality Date  . CARDIAC CATHETERIZATION    . CAROTID STENT  11-08 and 4-11   stent x 3 (12/2006 x 2; 2011 x 1 stent)  . CATARACT EXTRACTION W/PHACO Left 08/21/2014   Procedure: PHACO EMULSION CATARACT EXTRACTION AND INTRAOCULAR LENS PLACEMENT ([place]) IMPLANT LEFT EYE;  Surgeon: Marylynn Pearson, MD;  Location: [place];  Service: Ophthalmology;  Laterality: Left;  . COLONOSCOPY  2015  . COLONOSCOPY W/ POLYPECTOMY    . CORONARY ANGIOPLASTY    . HERNIA REPAIR Bilateral    x 2   different times  . PROSTATE SURGERY     had cancer; radical resection 1997     Allergies  Allergies  Allergen Reactions  . Other Hives    SEAFOOD  . Penicillins Other (See Comments)    "caused me to pass out"  . Sulfa Antibiotics Hives    History of Present Illness    [name] is a 78 year old male with a past medical history of sinus bradycardia, CAD (November 2008 PCI to diagonal vessel and RCA stent placement x2.  April 2011 95% stenosis distal to previous RCA stent, DES x1 distal RCA, June 2013 myocardial perfusion study was normal), migraine variant, OSA, thrombotic stroke, right leg weakness, prostate CA, dyslipidemia, hypokalemia, and chronic back pain.  He was last seen by Dr. Claiborne Billings on  08/10/2017.  During that time he was doing well he denied chest pain PND and orthopnea.  He was exercising 3 days/week at the [facility] and walking multiple times per week as well.  He was compliant with his CPAP.  He has been bit by a tick and was treated with doxycycline by his primary care provider.   He presents the clinic today and states he feels well.  He states he has not been at the [facility] due to the COVID-19 pandemic however, he has been walking outside several times per week for about a mile at a time.  He plans to walk more with the cooler weather.  He states he has been monitoring his blood pressure at home blood pressures in the 0000000 and Q000111Q systolic.  He states he does not use any extra salt.  He has switched to using pepper on his grits.  He also states he has been using his CPAP every night and with machines are much better than they used to be.  He denies chest pain, shortness of breath, lower extremity edema, fatigue, palpitations, melena, hematuria, hemoptysis, diaphoresis, weakness, presyncope, syncope, orthopnea, and PND.   Home Medications    Prior to Admission medications   Medication Sig Start Date End Date Taking? Authorizing Provider  amLODipine (NORVASC) 5 MG tablet TAKE  1 TABLET BY MOUTH DAILY 11/23/18   Troy Sine, MD  atorvastatin (LIPITOR) 40 MG tablet TAKE 1 TABLET(40 MG) BY MOUTH DAILY 11/29/18   Troy Sine, MD  Cholecalciferol (VITAMIN D) 2000 units CAPS Take 2,000 Units by mouth daily.    [provider]  clopidogrel (PLAVIX) 75 MG tablet TAKE 1 TABLET BY MOUTH EVERY DAY, MAKE APPT 09/13/18   Troy Sine, MD  hydrochlorothiazide (MICROZIDE) 12.5 MG capsule Take 1 capsule (12.5 mg total) by mouth daily. 08/29/18 08/29/19  [name], MD  lisinopril (PRINIVIL,ZESTRIL) 40 MG tablet TAKE 1 TABLET BY MOUTH DAILY 12/21/17   Troy Sine, MD  metoprolol tartrate (LOPRESSOR) 50 MG tablet TAKE 1 TABLET(50 MG) BY MOUTH TWICE DAILY 11/23/18   Troy Sine, MD  NITROSTAT 0.4 MG SL tablet PLACE 1 TABLET UNDER THE TONGUE EVERY 4 MINUTES AS NEEDED FOR CHEST PAIN. PLEASE MAKE APPOINTMENT 01/20/15   Carlena Bjornstad, MD    Family History    Family History  Problem Relation Age of Onset  . Colon cancer Father        possibly age 79 y.o diagnosed   . Stroke Mother   . Asthma Sister    He indicated that his mother is deceased. He indicated that his father is deceased. He indicated that the status of his sister is unknown.  Social History    Social History   Socioeconomic History  . Marital status: Married    Spouse name: Eugene Murray  . Number of children: 3  . Years of education: College  . Highest education level: Not on file  Occupational History  . Occupation: retired  Scientific laboratory technician  . Financial resource strain: Not hard at all  . Food insecurity    Worry: Never true    Inability: Never true  . Transportation needs    Medical: No    Non-medical: No  Tobacco Use  . Smoking status: Former Smoker    Years: 15.00    Types: Cigarettes    Quit date: 09/08/1985    Years since quitting: 33.2  . Smokeless tobacco: Never Used  Substance and Sexual Activity  . Alcohol use: Not Currently    Comment: seldom  . Drug use: No  . Sexual activity: Yes  Lifestyle  . Physical activity    Days per week: 0 days    Minutes per session: 0 min  . Stress: Not at all  Relationships  . Social Herbalist on phone: Not on file    Gets together: Not on file    Attends religious service: Not on file    Active member of club or organization: Not on file    Attends meetings of clubs or organizations: Not on file    Relationship status: Not on file  . Intimate partner violence    Fear of current or ex partner: No    Emotionally abused: No    Physically abused: No    Forced sexual activity: No  Other Topics Concern  . Not on file  Social History Narrative   3 kids    Retired Arboriculturist    Former smoker quit 1980s. Denies etOH, other  drugs    Patient lives at home with family. Patient lives at home with his wife Eugene Murray)   Caffeine Use: 1 glass of tea every other day     Review of Systems    General:  No chills, fever, night sweats or weight changes.  Cardiovascular:  No chest pain, dyspnea on exertion, edema, orthopnea, palpitations, paroxysmal nocturnal dyspnea. Dermatological: No rash, lesions/masses Respiratory: No cough, dyspnea Urologic: No hematuria, dysuria Abdominal:   No nausea, vomiting, diarrhea, bright red blood per rectum, melena, or hematemesis Neurologic:  No visual changes, wkns, changes in mental status. All other systems reviewed and are otherwise negative except as noted above.  Physical Exam    VS:  BP 140/82   Pulse (!) 55   Temp 98 F (36.7 C)   Ht 5\' 10"  (1.778 m)   Wt 205 lb (93 kg)   SpO2 93%   BMI 29.41 kg/m  , BMI Body mass index is 29.41 kg/m. GEN: Well nourished, well developed, in no acute distress. HEENT: normal. Neck: Supple, no JVD, carotid bruits, or masses. Cardiac: RRR, no murmurs, rubs, or gallops. No clubbing, cyanosis, edema.  Radials/DP/PT 2+ and equal bilaterally.  Respiratory:  Respirations regular and unlabored, clear to auscultation bilaterally. GI: Soft, nontender, nondistended, BS + x 4. MS: no deformity or atrophy. Skin: warm and dry, no rash. Neuro:  Strength and sensation are intact. Psych: Normal affect.  Accessory Clinical Findings    ECG personally reviewed by me today-sinus bradycardia 50 bpm- No acute changes  EKG 08/10/2017 Sinus bradycardia 50 bpm  Echocardiogram 07/01/2012 Study Conclusions   - Procedure narrative: Transthoracic echocardiography.  Technically difficult study with suboptimal visualization  of the valves and endocardium.  - Left ventricle: The cavity size was normal. There was  moderate concentric hypertrophy. Systolic function was  normal. The estimated ejection fraction was in the range  of 55% to 60%. Wall  motion was normal; there were no  regional wall motion abnormalities. Left ventricular  diastolic function parameters were normal.  - Left atrium: The atrium was normal in size.   Cardiac catheterization 06/03/2009   Assessment & Plan   1.  Essential hypertension-blood pressure today 140/82.  States his blood pressures at home are in the 0000000 to Q000111Q systolic. Increase amlodipine to 10 mg daily Continue hydrochlorothiazide 12.5 mg daily Continue lisinopril 40 mg tablet daily Continue metoprolol tartrate 50 mg tablet twice daily Increase physical activity as tolerated Heart healthy low-sodium diet-salty 6 given  2.  Hyperlipidemia-08/29/2018: Cholesterol, Total 122; HDL 39; LDL Calculated 70; Triglycerides 67 Continue atorvastatin 40 mg tablet daily Monitored by PCP  3.  Coronary artery disease-no chest pain today.  He continues to be physically active walking outside several times per week.  He has a goal to increase the amount of walking he is doing weekly. Continue metoprolol tartrate 50 mg tablet twice daily Continue atorvastatin 40 mg tablet daily Increase physical activity as tolerated Heart healthy low-sodium diet  4.  Obstructive sleep apnea- remains compliant on CPAP  Continue to monitor  Disposition: Follow-up in 2 weeks with APP for repeat blood pressure evaluation.  Deberah Pelton, NP-C 12/11/2018, 3:09 PM

## 2018-12-12 ENCOUNTER — Other Ambulatory Visit: Payer: Self-pay | Admitting: Cardiovascular Disease

## 2018-12-20 ENCOUNTER — Ambulatory Visit: Payer: Medicare Other | Admitting: Internal Medicine

## 2018-12-29 ENCOUNTER — Telehealth: Payer: Medicare Other

## 2018-12-29 ENCOUNTER — Ambulatory Visit (INDEPENDENT_AMBULATORY_CARE_PROVIDER_SITE_OTHER): Payer: Medicare Other

## 2018-12-29 ENCOUNTER — Other Ambulatory Visit: Payer: Self-pay

## 2018-12-29 DIAGNOSIS — I10 Essential (primary) hypertension: Secondary | ICD-10-CM

## 2018-12-29 DIAGNOSIS — I2510 Atherosclerotic heart disease of native coronary artery without angina pectoris: Secondary | ICD-10-CM

## 2018-12-29 DIAGNOSIS — E7800 Pure hypercholesterolemia, unspecified: Secondary | ICD-10-CM | POA: Diagnosis not present

## 2018-12-31 NOTE — Progress Notes (Signed)
Cardiology Clinic Note   Patient Name: Eugene Murray Date of Encounter: 01/01/2019  Primary Care Provider:  [name], MD Primary Cardiologist:  Shelva Majestic, MD  Patient Profile    Eugene Murray 78 year old male presents today for follow-up for his hypertension.  Past Medical History    Past Medical History:  Diagnosis Date  . Allergic rhinitis   . Cancer [facility])    Prostate cancer  . Coronary artery disease   . History of prostate cancer   . Hyperlipidemia   . Hypertension   . Hypokalemia   . Nephritis    as a child  . OSA (obstructive sleep apnea)    cpap machine at home  . Stroke [facility])    "mini stroke" TIA   Past Surgical History:  Procedure Laterality Date  . CARDIAC CATHETERIZATION    . CAROTID STENT  11-08 and 4-11   stent x 3 (12/2006 x 2; 2011 x 1 stent)  . CATARACT EXTRACTION W/PHACO Left 08/21/2014   Procedure: PHACO EMULSION CATARACT EXTRACTION AND INTRAOCULAR LENS PLACEMENT ([place]) IMPLANT LEFT EYE;  Surgeon: Marylynn Pearson, MD;  Location: [place];  Service: Ophthalmology;  Laterality: Left;  . COLONOSCOPY  2015  . COLONOSCOPY W/ POLYPECTOMY    . CORONARY ANGIOPLASTY    . HERNIA REPAIR Bilateral    x 2   different times  . PROSTATE SURGERY     had cancer; radical resection 1997     Allergies  Allergies  Allergen Reactions  . Other Hives    SEAFOOD  . Penicillins Other (See Comments)    "caused me to pass out"  . Sulfa Antibiotics Hives    History of Present Illness   [name] is a 78 year old male with a past medical history of sinus bradycardia, CAD (November 2008 PCI to diagonal vessel and RCA stent placement x2.  April 2011 95% stenosis distal to previous RCA stent, DES x1 distal RCA, June 2013 myocardial perfusion study was normal), migraine variant, OSA, thrombotic stroke, right leg weakness, prostate CA, dyslipidemia, hypokalemia, and chronic back pain.  He was last seen by me on 12/11/2018.  During that time he was doing well.  He had  not been as active due to COVID-19 pandemic.  However, he had still been walking outside several times per week.  He plans to be more active with the coming cooler weather and stated he has been monitoring his blood pressure at home.  His his systolic blood pressures were ranging in the 140s and 150s.  He was compliant with his CPAP and low-sodium diet.  His amlodipine was increased to 10 mg daily and he was given instructions on low-sodium diet.  He presents the clinic today and states he has been taking care of his 2-1/84-month-old granddaughter while his son works.  He states she has been keeping busy and preventing him from exercising regularly.  He also notices that he has had some dizziness through the day since he has started taking the amlodipine.  He notes that his blood pressures have been ranging in the 130s over 70s at home.  He also states that he is trying to walk more and states that he will be moving into the [facility] as he gets colder in the mornings to continue his walking.  I will move his amlodipine to at bedtime and encouraged him to maintain his p.o. fluids.  He denies chest pain, shortness of breath, lower extremity edema, fatigue, palpitations, melena, hematuria, hemoptysis, diaphoresis, weakness,  presyncope, syncope, orthopnea, and PND.    Home Medications    Prior to Admission medications   Medication Sig Start Date End Date Taking? Authorizing Provider  amLODipine (NORVASC) 10 MG tablet Take 1 tablet (10 mg total) by mouth daily. 12/11/18   Deberah Pelton, NP  atorvastatin (LIPITOR) 40 MG tablet TAKE 1 TABLET(40 MG) BY MOUTH DAILY 12/12/18   Troy Sine, MD  benzocaine (HURRICAINE) 20 % GEL Use as directed in the mouth or throat.    [provider]  Cholecalciferol (VITAMIN D) 2000 units CAPS Take 2,000 Units by mouth daily.    [provider]  clopidogrel (PLAVIX) 75 MG tablet Take 1 tablet (75 mg total) by mouth daily. 12/12/18   Troy Sine, MD   hydrochlorothiazide (MICROZIDE) 12.5 MG capsule Take 1 capsule (12.5 mg total) by mouth daily. 08/29/18 08/29/19  [name], MD  lisinopril (PRINIVIL,ZESTRIL) 40 MG tablet TAKE 1 TABLET BY MOUTH DAILY 12/21/17   Troy Sine, MD  metoprolol tartrate (LOPRESSOR) 50 MG tablet TAKE 1 TABLET(50 MG) BY MOUTH TWICE DAILY 11/23/18   Troy Sine, MD  NITROSTAT 0.4 MG SL tablet PLACE 1 TABLET UNDER THE TONGUE EVERY 4 MINUTES AS NEEDED FOR CHEST PAIN. PLEASE MAKE APPOINTMENT 01/20/15   Carlena Bjornstad, MD    Family History    Family History  Problem Relation Age of Onset  . Colon cancer Father        possibly age 13 y.o diagnosed   . Stroke Mother   . Asthma Sister    He indicated that his mother is deceased. He indicated that his father is deceased. He indicated that the status of his sister is unknown.  Social History    Social History   Socioeconomic History  . Marital status: Married    Spouse name: Eugene Murray  . Number of children: 3  . Years of education: College  . Highest education level: Not on file  Occupational History  . Occupation: retired  Scientific laboratory technician  . Financial resource strain: Not hard at all  . Food insecurity    Worry: Never true    Inability: Never true  . Transportation needs    Medical: No    Non-medical: No  Tobacco Use  . Smoking status: Former Smoker    Years: 15.00    Types: Cigarettes    Quit date: 09/08/1985    Years since quitting: 33.3  . Smokeless tobacco: Never Used  Substance and Sexual Activity  . Alcohol use: Not Currently    Comment: seldom  . Drug use: No  . Sexual activity: Yes  Lifestyle  . Physical activity    Days per week: 0 days    Minutes per session: 0 min  . Stress: Not at all  Relationships  . Social Herbalist on phone: Not on file    Gets together: Not on file    Attends religious service: Not on file    Active member of club or organization: Not on file    Attends meetings of clubs or organizations:  Not on file    Relationship status: Not on file  . Intimate partner violence    Fear of current or ex partner: No    Emotionally abused: No    Physically abused: No    Forced sexual activity: No  Other Topics Concern  . Not on file  Social History Narrative   3 kids    Retired Arboriculturist  Former smoker quit 1980s. Denies etOH, other drugs    Patient lives at home with family. Patient lives at home with his wife Eugene Murray)   Caffeine Use: 1 glass of tea every other day     Review of Systems    General:  No chills, fever, night sweats or weight changes.  Cardiovascular:  No chest pain, dyspnea on exertion, edema, orthopnea, palpitations, paroxysmal nocturnal dyspnea. Dermatological: No rash, lesions/masses Respiratory: No cough, dyspnea Urologic: No hematuria, dysuria Abdominal:   No nausea, vomiting, diarrhea, bright red blood per rectum, melena, or hematemesis Neurologic:  No visual changes, wkns, changes in mental status. All other systems reviewed and are otherwise negative except as noted above.  Physical Exam    VS:  BP 138/84   Pulse (!) 59   Ht 5\' 10"  (1.778 m)   Wt 208 lb (94.3 kg)   BMI 29.84 kg/m  , BMI Body mass index is 29.84 kg/m. GEN: Well nourished, well developed, in no acute distress. HEENT: normal. Neck: Supple, no JVD, carotid bruits, or masses. Cardiac: RRR, no murmurs, rubs, or gallops. No clubbing, cyanosis, edema.  Radials/DP/PT 2+ and equal bilaterally.  Respiratory:  Respirations regular and unlabored, clear to auscultation bilaterally. GI: Soft, nontender, nondistended, BS + x 4. MS: no deformity or atrophy. Skin: warm and dry, no rash. Neuro:  Strength and sensation are intact. Psych: Normal affect.  Accessory Clinical Findings    ECG personally reviewed by me today-none today.  EKG 12/11/2018 sinus bradycardia 50 bpm- No acute changes  EKG 08/10/2017 Sinus bradycardia 50 bpm  Echocardiogram 07/01/2012 Study Conclusions   - Procedure  narrative: Transthoracic echocardiography.  Technically difficult study with suboptimal visualization  of the valves and endocardium.  - Left ventricle: The cavity size was normal. There was  moderate concentric hypertrophy. Systolic function was  normal. The estimated ejection fraction was in the range  of 55% to 60%. Wall motion was normal; there were no  regional wall motion abnormalities. Left ventricular  diastolic function parameters were normal.  - Left atrium: The atrium was normal in size.   Cardiac catheterization 06/03/2009  New 95% stenosis beyond his  patent RCA stent in the distal RCA. A  3.5x12 mm  DESx1 was placed. His diagonal stent was patent. He had 20% mid LAD stenosis.  Assessment & Plan   1.Essential hypertension-blood pressure today 138/84.  States his blood pressures at home are in the 130's over 70's. Contine amlodipine to 10 mg at bedtime Continue hydrochlorothiazide 12.5 mg daily Continue lisinopril 40 mg tablet daily Continue metoprolol tartrate 50 mg tablet twice daily Increase physical activity as tolerated Heart healthy low-sodium diet-salty six sheet  2.  Hyperlipidemia-08/29/2018: Cholesterol, Total 122; HDL 39; LDL Calculated 70; Triglycerides 67 Continue atorvastatin 40 mg tablet daily Monitored by PCP  3.  Coronary artery disease-no chest pain today.  He continues to be physically active walking outside multiple times per week.  He has a goal to increase the amount of walking he is doing weekly. Continue metoprolol tartrate 50 mg tablet twice daily Continue atorvastatin 40 mg tablet daily Increase physical activity as tolerated Heart healthy low-sodium diet  Dizziness-states that he has noticed occasional dizziness since starting blood pressure medication. Increase p.o. fluids Lower extremity support stockings Take amlodipine at night  Disposition: Follow-up with Dr. Claiborne Billings in 6 months.  Jossie Ng. [facility] [place] [address] Office [phone] Fax [phone]

## 2019-01-01 ENCOUNTER — Other Ambulatory Visit: Payer: Self-pay

## 2019-01-01 ENCOUNTER — Ambulatory Visit (INDEPENDENT_AMBULATORY_CARE_PROVIDER_SITE_OTHER): Payer: Medicare Other | Admitting: General Practice

## 2019-01-01 ENCOUNTER — Encounter: Payer: Self-pay | Admitting: General Practice

## 2019-01-01 VITALS — BP 138/84 | HR 59 | Ht 70.0 in | Wt 208.0 lb

## 2019-01-01 DIAGNOSIS — R42 Dizziness and giddiness: Secondary | ICD-10-CM | POA: Diagnosis not present

## 2019-01-01 DIAGNOSIS — I10 Essential (primary) hypertension: Secondary | ICD-10-CM | POA: Diagnosis not present

## 2019-01-01 DIAGNOSIS — I2510 Atherosclerotic heart disease of native coronary artery without angina pectoris: Secondary | ICD-10-CM | POA: Diagnosis not present

## 2019-01-01 DIAGNOSIS — E785 Hyperlipidemia, unspecified: Secondary | ICD-10-CM | POA: Diagnosis not present

## 2019-01-01 MED ORDER — AMLODIPINE BESYLATE 10 MG PO TABS: 10.0000 mg | ORAL_TABLET | Freq: Every evening | ORAL | 1 refills | Status: DC

## 2019-01-01 NOTE — Patient Instructions (Signed)
Visit Information  Goals Addressed      Patient Stated   . "I would like to get my blood pressure a littler lower" (pt-stated)       Current Barriers:  Marland Kitchen Knowledge Deficits related to disease process and Self Health management of HTN  Nurse Case Manager Clinical Goal(s):  Marland Kitchen Over the next 90 days, patient will demonstrate a decrease in Hypertensive exacerbations as evidenced by patient will maintain a BP of 130/80 or lower . Over the next 90 days, patient will Self monitor his BP at home and will record his BP readings, reporting abnormal readings to the CCM team and or PCP/Cardiology  CCM RN CM Interventions:  12/29/18 call completed with patient   . Evaluation of current treatment plan related to HTN and patient's adherence to plan as established by provider. . Provided education to patient re: target BP range of <130/80; discussed non pharmacological ways to lower BP, including, implementation of exercise into daily routine, stay well hydrated while adhering to a heart healthy low sodium diet, dicussed living an overall healthy lifestyle avoiding alcohol and tobacco products and taking prescribed medications; discussed outcome of patient recent f/u with Cardiology and discussed recommendations given and next f/u appt . Reviewed medications with patient and discussed indication of Hypertensive/Cardiac medications, including dosage and frequency; instructed patient on importance of taking these medications exactly as prescribed at the same time every day for best results . Discussed plans with patient for ongoing care management follow up and provided patient with direct contact information for care management team . Provided patient with printed educational materials related to What is High Blood Pressure; High Blood Pressure and Stroke; Why Should I Limit Salt; What About African Americans High Blood Pressure; Life's Simple 7 Eat Better  Patient Self Care Activities:  . Self administers  medications as prescribed . Attends all scheduled provider appointments . Calls pharmacy for medication refills . Performs ADL's independently . Performs IADL's independently . Calls provider office for new concerns or questions  Initial goal documentation        The patient verbalized understanding of instructions provided today and declined a print copy of patient instruction materials.   Telephone follow up appointment with care management team member scheduled for: 02/07/19  Barb Merino, RN, BSN, CCM Care Management Coordinator [place] [facility]  Direct Phone: [phone]

## 2019-01-01 NOTE — Chronic Care Management (AMB) (Addendum)
Chronic Care Management   Initial Visit Note  12/29/2018 Name: Eugene Murray MRN: [id number] DOB: September 29, 1940  Referred by: [name], MD Reason for referral : Chronic Care Management ([place] [facility]phone Outreach )   Eugene Murray is a 78 y.o. year old male who is a primary care patient of [name], MD. The CCM team was consulted for assistance with chronic disease management and care coordination needs related to HTN  Review of patient status, including review of consultants reports, relevant laboratory and other test results, and collaboration with appropriate care team members and the patient's provider was performed as part of comprehensive patient evaluation and provision of chronic care management services.    SDOH (Social Determinants of Health) screening performed today: None. See Care Plan for related entries.   Advanced Directives Status: N See Care Plan and Vynca application for related entries.   I spoke with Eugene Murray by telephone today to assess for CCM RN CM needs and a care plan was established.   Medications: Outpatient Encounter Medications as of 12/29/2018  Medication Sig  . amLODipine (NORVASC) 10 MG tablet Take 1 tablet (10 mg total) by mouth daily.  Marland Kitchen atorvastatin (LIPITOR) 40 MG tablet TAKE 1 TABLET(40 MG) BY MOUTH DAILY  . benzocaine (HURRICAINE) 20 % GEL Use as directed in the mouth or throat.  . Cholecalciferol (VITAMIN D) 2000 units CAPS Take 2,000 Units by mouth daily.  . clopidogrel (PLAVIX) 75 MG tablet Take 1 tablet (75 mg total) by mouth daily.  . hydrochlorothiazide (MICROZIDE) 12.5 MG capsule Take 1 capsule (12.5 mg total) by mouth daily.  Marland Kitchen lisinopril (PRINIVIL,ZESTRIL) 40 MG tablet TAKE 1 TABLET BY MOUTH DAILY  . metoprolol tartrate (LOPRESSOR) 50 MG tablet TAKE 1 TABLET(50 MG) BY MOUTH TWICE DAILY  . NITROSTAT 0.4 MG SL tablet PLACE 1 TABLET UNDER THE TONGUE EVERY 4 MINUTES AS NEEDED FOR CHEST PAIN. PLEASE MAKE APPOINTMENT   No  facility-administered encounter medications on file as of 12/29/2018.      Objective:  Lab Results  Component Value Date   HGBA1C 5.4 07/01/2012   Lab Results  Component Value Date   MICROALBUR 10 08/29/2018   LDLCALC 70 08/29/2018   CREATININE 1.06 08/29/2018   BP Readings from Last 3 Encounters:  01/01/19 (!) 155/85  12/11/18 140/82  08/29/18 134/80    Goals Addressed      Patient Stated   . "I would like to get my blood pressure a littler lower" (pt-stated)       Current Barriers:  Marland Kitchen Knowledge Deficits related to disease process and Self Health management of HTN  Nurse Case Manager Clinical Goal(s):  Marland Kitchen Over the next 90 days, patient will demonstrate a decrease in Hypertensive exacerbations as evidenced by patient will maintain a BP of 130/80 or lower . Over the next 90 days, patient will Self monitor his BP at home and will record his BP readings, reporting abnormal readings to the CCM team and or PCP/Cardiology  CCM RN CM Interventions:  12/29/18 call completed with patient   . Evaluation of current treatment plan related to HTN and patient's adherence to plan as established by provider. . Provided education to patient re: target BP range of <130/80; discussed non pharmacological ways to lower BP, including, implementation of exercise into daily routine, stay well hydrated while adhering to a heart healthy low sodium diet, dicussed living an overall healthy lifestyle avoiding alcohol and tobacco products and taking prescribed medications; discussed outcome of  patient recent f/u with Cardiology and discussed recommendations given and next f/u appt . Reviewed medications with patient and discussed indication of Hypertensive/Cardiac medications, including dosage and frequency; instructed patient on importance of taking these medications exactly as prescribed at the same time every day for best results . Discussed plans with patient for ongoing care management follow up and  provided patient with direct contact information for care management team . Provided patient with printed educational materials related to What is High Blood Pressure; High Blood Pressure and Stroke; Why Should I Limit Salt; What About African Americans High Blood Pressure; Life's Simple 7 Eat Better  Patient Self Care Activities:  . Self administers medications as prescribed . Attends all scheduled provider appointments . Calls pharmacy for medication refills . Performs ADL's independently . Performs IADL's independently . Calls provider office for new concerns or questions  Initial goal documentation         Eugene Murray was given information about Chronic Care Management services today including:  1. CCM service includes personalized support from designated clinical staff supervised by his physician, including individualized plan of care and coordination with other care providers 2. 24/7 contact phone numbers for assistance for urgent and routine care needs. 3. Service will only be billed when office clinical staff spend 20 minutes or more in a month to coordinate care. 4. Only one practitioner may furnish and bill the service in a calendar month. 5. The patient may stop CCM services at any time (effective at the end of the month) by phone call to the office staff. 6. The patient will be responsible for cost sharing (co-pay) of up to 20% of the service fee (after annual deductible is met).  Patient agreed to services and verbal consent obtained.   Plan:   Telephone follow up appointment with care management team member scheduled for:02/07/19  Barb Merino, RN, BSN, CCM Care Management Coordinator [place] [facility]  Direct Phone: [phone]

## 2019-01-01 NOTE — Patient Instructions (Signed)
PLEASE INCREASE FLUID INTAKE TO 2 LITERS DAILY-WATER  PLEASE READ AND FOLLOW SALTY 6 HANDOUT  PLEASE PURCHASE AND WEAR COMPRESSION STOCKINGS DAILY AND OFF AT BEDTIME. Compression stockings are elastic socks that squeeze the legs. They help to increase blood flow to the legs and to decrease swelling in the legs from fluid retention, and reduce the chance of developing blood clots in the lower legs.   Follow-Up: IN 6 months Please call our office 2 months in advance, FEB 2021 to schedule this MAY 2021 appointment. In Person You may see Shelva Majestic, MD [place], FNP or one of the following Advanced Practice Providers on your designated Care Team:  Almyra Deforest, PA-C  Fabian Sharp, PA-C or  [place], [place].    Medication Instructions:  The current medical regimen is effective;  continue present plan and medications as directed. Please refer to the Current Medication list given to you today. If you need a refill on your cardiac medications before your next appointment, please call your pharmacy.  At [facility], you and your health needs are our priority.  As part of our continuing mission to provide you with exceptional heart care, we have created designated Provider Care Teams.  These Care Teams include your primary Cardiologist (physician) and Advanced Practice Providers (APPs -  Physician Assistants and Nurse Practitioners) who all work together to provide you with the care you need, when you need it.  Thank you for choosing [facility] at [facility]!!

## 2019-01-09 ENCOUNTER — Telehealth: Payer: Self-pay

## 2019-01-10 ENCOUNTER — Ambulatory Visit: Payer: Medicare Other

## 2019-01-10 DIAGNOSIS — I10 Essential (primary) hypertension: Secondary | ICD-10-CM

## 2019-01-10 NOTE — Chronic Care Management (AMB) (Signed)
  Chronic Care Management   Social Work CCM Outreach Note  01/10/2019 Name: DIMAS BOMBA MRN: [id number] DOB: 05-Jan-1941  [name] is a 78 y.o. year old male who is a primary care patient of [name], MD . The CCM team was consulted for assistance with care coordination.   SW reached out to the patient to introduce self and conduct SDOH screen. The patient identified no challenges at this time.  The patient was provided SW contact information should the patient need assistance with resources in the future.   Follow Up Plan: No further SW follow up planned at this time. The patient will remain active with RN Case Manager.  Daneen Schick, BSW, CDP Social Worker, Certified Dementia Practitioner [place] [facility] [place] [facility] [phone]

## 2019-02-07 ENCOUNTER — Telehealth: Payer: Self-pay

## 2019-02-10 ENCOUNTER — Other Ambulatory Visit: Payer: Self-pay | Admitting: Cardiovascular Disease

## 2019-02-14 ENCOUNTER — Other Ambulatory Visit: Payer: Self-pay

## 2019-02-20 ENCOUNTER — Other Ambulatory Visit: Payer: Self-pay

## 2019-02-20 ENCOUNTER — Ambulatory Visit (INDEPENDENT_AMBULATORY_CARE_PROVIDER_SITE_OTHER): Payer: Medicare Other | Admitting: Internal Medicine

## 2019-02-20 ENCOUNTER — Encounter: Payer: Self-pay | Admitting: Internal Medicine

## 2019-02-20 VITALS — BP 136/84 | HR 53 | Temp 98.2°F | Ht 70.0 in | Wt 208.0 lb

## 2019-02-20 DIAGNOSIS — I119 Hypertensive heart disease without heart failure: Secondary | ICD-10-CM

## 2019-02-20 DIAGNOSIS — Z23 Encounter for immunization: Secondary | ICD-10-CM

## 2019-02-20 DIAGNOSIS — M549 Dorsalgia, unspecified: Secondary | ICD-10-CM

## 2019-02-20 DIAGNOSIS — I2510 Atherosclerotic heart disease of native coronary artery without angina pectoris: Secondary | ICD-10-CM | POA: Diagnosis not present

## 2019-02-20 DIAGNOSIS — E7800 Pure hypercholesterolemia, unspecified: Secondary | ICD-10-CM

## 2019-02-20 DIAGNOSIS — L84 Corns and callosities: Secondary | ICD-10-CM

## 2019-02-20 MED ORDER — EPINEPHRINE 0.3 MG/0.3ML IJ SOAJ: 0.3000 mg | INTRAMUSCULAR | 1 refills | Status: DC | PRN

## 2019-02-20 MED ORDER — CYCLOBENZAPRINE HCL 5 MG PO TABS: ORAL_TABLET | ORAL | 0 refills | Status: DC

## 2019-02-20 NOTE — Patient Instructions (Addendum)
250-400mg  magnesium supplementation nightly to help with muscle spasms/back pain  You may use pumice stone on your callus  You may try salicyclic acid on this area as well. You may get this over the counter.   Sciatica  Sciatica is pain, weakness, tingling, or loss of feeling (numbness) along the sciatic nerve. The sciatic nerve starts in the lower back and goes down the back of each leg. Sciatica usually goes away on its own or with treatment. Sometimes, sciatica may come back (recur). What are the causes? This condition happens when the sciatic nerve is pinched or has pressure put on it. This may be the result of:  A disk in between the bones of the spine bulging out too far (herniated disk).  Changes in the spinal disks that occur with aging.  A condition that affects a muscle in the butt.  Extra bone growth near the sciatic nerve.  A break (fracture) of the area between your hip bones (pelvis).  Pregnancy.  Tumor. This is rare. What increases the risk? You are more likely to develop this condition if you:  Play sports that put pressure or stress on the spine.  Have poor strength and ease of movement (flexibility).  Have had a back injury in the past.  Have had back surgery.  Sit for long periods of time.  Do activities that involve bending or lifting over and over again.  Are very overweight (obese). What are the signs or symptoms? Symptoms can vary from mild to very bad. They may include:  Any of these problems in the lower back, leg, hip, or butt: ? Mild tingling, loss of feeling, or dull aches. ? Burning sensations. ? Sharp pains.  Loss of feeling in the back of the calf or the sole of the foot.  Leg weakness.  Very bad back pain that makes it hard to move. These symptoms may get worse when you cough, sneeze, or laugh. They may also get worse when you sit or stand for long periods of time. How is this treated? This condition often gets better without any  treatment. However, treatment may include:  Changing or cutting back on physical activity when you have pain.  Doing exercises and stretching.  Putting ice or heat on the affected area.  Medicines that help: ? To relieve pain and swelling. ? To relax your muscles.  Shots (injections) of medicines that help to relieve pain, irritation, and swelling.  Surgery. Follow these instructions at home: Medicines  Take over-the-counter and prescription medicines only as told by your doctor.  Ask your doctor if the medicine prescribed to you: ? Requires you to avoid driving or using heavy machinery. ? Can cause trouble pooping (constipation). You may need to take these steps to prevent or treat trouble pooping:  Drink enough fluids to keep your pee (urine) pale yellow.  Take over-the-counter or prescription medicines.  Eat foods that are high in fiber. These include beans, whole grains, and fresh fruits and vegetables.  Limit foods that are high in fat and sugar. These include fried or sweet foods. Managing pain      If told, put ice on the affected area. ? Put ice in a plastic bag. ? Place a towel between your skin and the bag. ? Leave the ice on for 20 minutes, 2-3 times a day.  If told, put heat on the affected area. Use the heat source that your doctor tells you to use, such as a moist heat pack or  a heating pad. ? Place a towel between your skin and the heat source. ? Leave the heat on for 20-30 minutes. ? Remove the heat if your skin turns bright red. This is very important if you are unable to feel pain, heat, or cold. You may have a greater risk of getting burned. Activity   Return to your normal activities as told by your doctor. Ask your doctor what activities are safe for you.  Avoid activities that make your symptoms worse.  Take short rests during the day. ? When you rest for a long time, do some physical activity or stretching between periods of rest. ? Avoid  sitting for a long time without moving. Get up and move around at least one time each hour.  Exercise and stretch regularly, as told by your doctor.  Do not lift anything that is heavier than 10 lb (4.5 kg) while you have symptoms of sciatica. ? Avoid lifting heavy things even when you do not have symptoms. ? Avoid lifting heavy things over and over.  When you lift objects, always lift in a way that is safe for your body. To do this, you should: ? Bend your knees. ? Keep the object close to your body. ? Avoid twisting. General instructions  Stay at a healthy weight.  Wear comfortable shoes that support your feet. Avoid wearing high heels.  Avoid sleeping on a mattress that is too soft or too hard. You might have less pain if you sleep on a mattress that is firm enough to support your back.  Keep all follow-up visits as told by your doctor. This is important. Contact a doctor if:  You have pain that: ? Wakes you up when you are sleeping. ? Gets worse when you lie down. ? Is worse than the pain you have had in the past. ? Lasts longer than 4 weeks.  You lose weight without trying. Get help right away if:  You cannot control when you pee (urinate) or poop (have a bowel movement).  You have weakness in any of these areas and it gets worse: ? Lower back. ? The area between your hip bones. ? Butt. ? Legs.  You have redness or swelling of your back.  You have a burning feeling when you pee. Summary  Sciatica is pain, weakness, tingling, or loss of feeling (numbness) along the sciatic nerve.  This condition happens when the sciatic nerve is pinched or has pressure put on it.  Sciatica can cause pain, tingling, or loss of feeling (numbness) in the lower back, legs, hips, and butt.  Treatment often includes rest, exercise, medicines, and putting ice or heat on the affected area. This information is not intended to replace advice given to you by your health care provider.  Make sure you discuss any questions you have with your health care provider. Document Released: 11/25/2007 Document Revised: 03/06/2018 Document Reviewed: 03/06/2018 Elsevier Patient Education  [place] are small areas of thickened skin that occur on the top, sides, or tip of a toe. They contain a cone-shaped core with a point that can press on a nerve below. This causes pain.  Calluses are areas of thickened skin that can occur anywhere on the body, including the hands, fingers, palms, soles of the feet, and heels. Calluses are usually larger than corns. What are the causes? Corns and calluses are caused by rubbing (friction) or pressure, such as from shoes that are too  tight or do not fit properly. What increases the risk? Corns are more likely to develop in people who have misshapen toes (toe deformities), such as hammer toes. Calluses can occur with friction to any area of the skin. They are more likely to develop in people who:  Work with their hands.  Wear shoes that fit poorly, are too tight, or are high-heeled.  Have toe deformities. What are the signs or symptoms? Symptoms of a corn or callus include:  A hard growth on the skin.  Pain or tenderness under the skin.  Redness and swelling.  Increased discomfort while wearing tight-fitting shoes, if your feet are affected. If a corn or callus becomes infected, symptoms may include:  Redness and swelling that gets worse.  Pain.  Fluid, blood, or pus draining from the corn or callus. How is this diagnosed? Corns and calluses may be diagnosed based on your symptoms, your medical history, and a physical exam. How is this treated? Treatment for corns and calluses may include:  Removing the cause of the friction or pressure. This may involve: ? Changing your shoes. ? Wearing shoe inserts (orthotics) or other protective layers in your shoes, such as a corn pad. ? Wearing gloves.   Applying medicine to the skin (topical medicine) to help soften skin in the hardened, thickened areas.  Removing layers of dead skin with a file to reduce the size of the corn or callus.  Removing the corn or callus with a scalpel or laser.  Taking antibiotic medicines, if your corn or callus is infected.  Having surgery, if a toe deformity is the cause. Follow these instructions at home:   Take over-the-counter and prescription medicines only as told by your health care provider.  If you were prescribed an antibiotic, take it as told by your health care provider. Do not stop taking it even if your condition starts to improve.  Wear shoes that fit well. Avoid wearing high-heeled shoes and shoes that are too tight or too loose.  Wear any padding, protective layers, gloves, or orthotics as told by your health care provider.  Soak your hands or feet and then use a file or pumice stone to soften your corn or callus. Do this as told by your health care provider.  Check your corn or callus every day for symptoms of infection. Contact a health care provider if you:  Notice that your symptoms do not improve with treatment.  Have redness or swelling that gets worse.  Notice that your corn or callus becomes painful.  Have fluid, blood, or pus coming from your corn or callus.  Have new symptoms. Summary  Corns are small areas of thickened skin that occur on the top, sides, or tip of a toe.  Calluses are areas of thickened skin that can occur anywhere on the body, including the hands, fingers, palms, and soles of the feet. Calluses are usually larger than corns.  Corns and calluses are caused by rubbing (friction) or pressure, such as from shoes that are too tight or do not fit properly.  Treatment may include wearing any padding, protective layers, gloves, or orthotics as told by your health care provider. This information is not intended to replace advice given to you by your health  care provider. Make sure you discuss any questions you have with your health care provider. Document Released: 11/22/2003 Document Revised: 06/07/2018 Document Reviewed: 12/29/2016 Elsevier Patient Education  2020 [employer].

## 2019-02-20 NOTE — Progress Notes (Signed)
This visit occurred during the SARS-CoV-2 public health emergency.  Safety protocols were in place, including screening questions prior to the visit, additional usage of staff PPE, and extensive cleaning of exam room while observing appropriate contact time as indicated for disinfecting solutions.  Subjective:     Patient ID: Eugene Murray , male    DOB: 08/13/1940 , 78 y.o.   MRN: [id number]   Chief Complaint  Patient presents with  . Hypertension    HPI  Hypertension This is a chronic problem. The current episode started more than 1 year ago. The problem has been gradually improving since onset. The problem is controlled. Risk factors for coronary artery disease include dyslipidemia and male gender. The current treatment provides moderate improvement.     Past Medical History:  Diagnosis Date  . Allergic rhinitis   . Cancer [facility])    Prostate cancer  . Coronary artery disease   . History of prostate cancer   . Hyperlipidemia   . Hypertension   . Hypokalemia   . Nephritis    as a child  . OSA (obstructive sleep apnea)    cpap machine at home  . Stroke [facility])    "mini stroke" TIA     Family History  Problem Relation Age of Onset  . Colon cancer Father        possibly age 21 y.o diagnosed   . Stroke Mother   . Asthma Sister      Current Outpatient Medications:  .  amLODipine (NORVASC) 10 MG tablet, Take 1 tablet (10 mg total) by mouth every evening., Disp: 90 tablet, Rfl: 1 .  atorvastatin (LIPITOR) 40 MG tablet, TAKE 1 TABLET(40 MG) BY MOUTH DAILY, Disp: 90 tablet, Rfl: 0 .  Cholecalciferol (VITAMIN D) 2000 units CAPS, Take 2,000 Units by mouth daily., Disp: , Rfl:  .  clopidogrel (PLAVIX) 75 MG tablet, Take 1 tablet (75 mg total) by mouth daily., Disp: 90 tablet, Rfl: 0 .  hydrochlorothiazide (MICROZIDE) 12.5 MG capsule, Take 1 capsule (12.5 mg total) by mouth daily., Disp: 90 capsule, Rfl: 2 .  lisinopril (ZESTRIL) 40 MG tablet, TAKE 1 TABLET BY MOUTH DAILY, Disp:  90 tablet, Rfl: 3 .  metoprolol tartrate (LOPRESSOR) 50 MG tablet, TAKE 1 TABLET(50 MG) BY MOUTH TWICE DAILY, Disp: 180 tablet, Rfl: 0 .  NITROSTAT 0.4 MG SL tablet, PLACE 1 TABLET UNDER THE TONGUE EVERY 4 MINUTES AS NEEDED FOR CHEST PAIN. PLEASE MAKE APPOINTMENT, Disp: 25 tablet, Rfl: 0 .  cyclobenzaprine (FLEXERIL) 5 MG tablet, One tab po qpm prn, Disp: 20 tablet, Rfl: 0   Allergies  Allergen Reactions  . Other Hives    SEAFOOD  . Penicillins Other (See Comments)    "caused me to pass out"  . Sulfa Antibiotics Hives     Review of Systems  Constitutional: Negative.   Respiratory: Negative.   Cardiovascular: Negative.   Gastrointestinal: Negative.   Musculoskeletal: Positive for back pain.       He c/o low back pain. There is pain with movement. Denies fall/trauma. Denies LE weakness/paresthesias.   He also c/o left foot pain. There is pain with ambulation.   Neurological: Negative.   Psychiatric/Behavioral: Negative.      Today's Vitals   02/20/19 1114  BP: 136/84  Pulse: (!) 53  Temp: 98.2 F (36.8 C)  TempSrc: Oral  Weight: 208 lb (94.3 kg)  Height: 5' 10"  (1.778 m)  PainSc: 0-No pain   Body mass index is 29.84 kg/m.  Objective:  Physical Exam Vitals and nursing note reviewed.  Constitutional:      Appearance: Normal appearance.  HENT:     Head: Normocephalic and atraumatic.  Cardiovascular:     Rate and Rhythm: Normal rate and regular rhythm.     Pulses:          Dorsalis pedis pulses are 2+ on the left side.     Heart sounds: Normal heart sounds.  Pulmonary:     Effort: Pulmonary effort is normal.     Breath sounds: Normal breath sounds.  Musculoskeletal:     Lumbar back: Negative right straight leg raise test and negative left straight leg raise test.  Feet:     Left foot:     Skin integrity: Callus and dry skin present.     Toenail Condition: Left toenails are normal.  Skin:    General: Skin is warm.  Neurological:     General: No focal deficit  present.     Mental Status: He is alert.  Psychiatric:        Mood and Affect: Mood normal.         Assessment And Plan:     1. Hypertensive heart disease without heart failure  Chronic, controlled. He will continue with current meds. He is encouraged to avoid adding salt to his foods. I will check renal function today.   - CMP14+EGFR  2. Coronary artery disease involving native coronary artery of native heart without angina pectoris  Chronic, yet stable. He is also followed by Cardiology.   3. Pure hypercholesterolemia  Chronic, yet stable.He is encouraged to continue with current meds, exercise regularly and to avoid fried foods.   4. Acute back pain less than 4 weeks duration  He was given rx cyclobenzaprine 54m nightly as needed. He will let me know if his sx persist.   5. Callus of foot  He is advised to use pumice stone to soften area of skin. Advised he can go to Podiatry for removal if problem persists.   6. Immunization due  He was given pneumovax-23 IM x 1.     RMaximino Greenland MD    THE PATIENT IS ENCOURAGED TO PRACTICE SOCIAL DISTANCING DUE TO THE COVID-19 PANDEMIC.

## 2019-02-21 LAB — CMP14+EGFR
ALT: 19 IU/L (ref 0–44)
AST: 19 IU/L (ref 0–40)
Albumin/Globulin Ratio: 1.6 (ref 1.2–2.2)
Albumin: 4.1 g/dL (ref 3.7–4.7)
Alkaline Phosphatase: 73 IU/L (ref 39–117)
BUN/Creatinine Ratio: 12 (ref 10–24)
BUN: 14 mg/dL (ref 8–27)
Bilirubin Total: 0.8 mg/dL (ref 0.0–1.2)
CO2: 25 mmol/L (ref 20–29)
Calcium: 8.7 mg/dL (ref 8.6–10.2)
Chloride: 103 mmol/L (ref 96–106)
Creatinine, Ser: 1.13 mg/dL (ref 0.76–1.27)
GFR calc Af Amer: 72 mL/min/{1.73_m2} (ref >59)
GFR calc non Af Amer: 62 mL/min/{1.73_m2} (ref >59)
Globulin, Total: 2.6 g/dL (ref 1.5–4.5)
Glucose: 69 mg/dL (ref 65–99)
Potassium: 3.8 mmol/L (ref 3.5–5.2)
Sodium: 140 mmol/L (ref 134–144)
Total Protein: 6.7 g/dL (ref 6.0–8.5)

## 2019-02-27 ENCOUNTER — Ambulatory Visit: Payer: Medicare Other

## 2019-02-27 ENCOUNTER — Other Ambulatory Visit (INDEPENDENT_AMBULATORY_CARE_PROVIDER_SITE_OTHER): Payer: Medicare Other

## 2019-02-27 ENCOUNTER — Other Ambulatory Visit: Payer: Self-pay

## 2019-02-27 DIAGNOSIS — Z23 Encounter for immunization: Secondary | ICD-10-CM

## 2019-03-12 ENCOUNTER — Other Ambulatory Visit: Payer: Self-pay | Admitting: Cardiovascular Disease

## 2019-04-12 ENCOUNTER — Other Ambulatory Visit: Payer: Self-pay | Admitting: Cardiovascular Disease

## 2019-05-23 ENCOUNTER — Other Ambulatory Visit: Payer: Self-pay | Admitting: General Practice

## 2019-06-01 ENCOUNTER — Encounter (HOSPITAL_COMMUNITY): Payer: Self-pay | Admitting: Emergency Medicine

## 2019-06-01 ENCOUNTER — Emergency Department (HOSPITAL_COMMUNITY)
Admission: EM | Admit: 2019-06-01 | Discharge: 2019-06-01 | Disposition: A | Discharge status: Home / Self Care | Payer: Medicare Other | Attending: Emergency Medicine | Admitting: Emergency Medicine

## 2019-06-01 DIAGNOSIS — Y999 Unspecified external cause status: Secondary | ICD-10-CM | POA: Diagnosis not present

## 2019-06-01 DIAGNOSIS — Z8673 Personal history of transient ischemic attack (TIA), and cerebral infarction without residual deficits: Secondary | ICD-10-CM | POA: Diagnosis not present

## 2019-06-01 DIAGNOSIS — I2510 Atherosclerotic heart disease of native coronary artery without angina pectoris: Secondary | ICD-10-CM | POA: Insufficient documentation

## 2019-06-01 DIAGNOSIS — W57XXXA Bitten or stung by nonvenomous insect and other nonvenomous arthropods, initial encounter: Secondary | ICD-10-CM | POA: Diagnosis not present

## 2019-06-01 DIAGNOSIS — Y939 Activity, unspecified: Secondary | ICD-10-CM | POA: Diagnosis not present

## 2019-06-01 DIAGNOSIS — I10 Essential (primary) hypertension: Secondary | ICD-10-CM | POA: Diagnosis not present

## 2019-06-01 DIAGNOSIS — Z79899 Other long term (current) drug therapy: Secondary | ICD-10-CM | POA: Diagnosis not present

## 2019-06-01 DIAGNOSIS — Z87891 Personal history of nicotine dependence: Secondary | ICD-10-CM | POA: Insufficient documentation

## 2019-06-01 DIAGNOSIS — Y929 Unspecified place or not applicable: Secondary | ICD-10-CM | POA: Diagnosis not present

## 2019-06-01 DIAGNOSIS — S70361A Insect bite (nonvenomous), right thigh, initial encounter: Secondary | ICD-10-CM | POA: Insufficient documentation

## 2019-06-01 MED ORDER — DOXYCYCLINE HYCLATE 100 MG PO TABS
100.0000 mg | ORAL_TABLET | Freq: Once | ORAL | Status: AC
  Administered 2019-06-01: 100 mg via ORAL
  Filled 2019-06-01: qty 1

## 2019-06-01 MED ORDER — DOXYCYCLINE HYCLATE 50 MG PO CAPS: 50.0000 mg | ORAL_CAPSULE | Freq: Two times a day (BID) | ORAL | 0 refills | Status: AC

## 2019-06-01 NOTE — ED Provider Notes (Signed)
[facility] EMERGENCY DEPARTMENT Provider Note   CSN: [id number] Arrival date & time: 06/01/19  1420     History Chief Complaint  Patient presents with  . Tick Removal    Eugene Murray is a 79 y.o. male with past medical history of CAD, hypertension,     Past Medical History:  Diagnosis Date  . Allergic rhinitis   . Cancer [facility])    Prostate cancer  . Coronary artery disease   . History of prostate cancer   . Hyperlipidemia   . Hypertension   . Hypokalemia   . Nephritis    as a child  . OSA (obstructive sleep apnea)    cpap machine at home  . Stroke [facility])    "mini stroke" TIA    Patient Active Problem List   Diagnosis Date Noted  . History of CVA (cerebrovascular accident) 04/02/2016  . Back pain, chronic 07/26/2012  . Right leg weakness 07/26/2012  . Migraine variant 07/26/2012  . Stroke-like symptoms 07/26/2012  . Stroke, acute, thrombotic ([place]) 06/30/2012  . Sinus bradycardia 06/30/2012  . CAD (coronary artery disease) 06/30/2012  . Dyslipidemia 06/30/2012  . Hypokalemia 06/30/2012  . Obstructive sleep apnea 05/05/2007  . Essential hypertension 05/05/2007  . ALLERGIC RHINITIS 05/05/2007  . PROSTATE CANCER, HX OF 05/05/2007    Past Surgical History:  Procedure Laterality Date  . CARDIAC CATHETERIZATION    . CAROTID STENT  11-08 and 4-11   stent x 3 (12/2006 x 2; 2011 x 1 stent)  . CATARACT EXTRACTION W/PHACO Left 08/21/2014   Procedure: PHACO EMULSION CATARACT EXTRACTION AND INTRAOCULAR LENS PLACEMENT ([place]) IMPLANT LEFT EYE;  Surgeon: Marylynn Pearson, MD;  Location: [facility];  Service: Ophthalmology;  Laterality: Left;  . COLONOSCOPY  2015  . COLONOSCOPY W/ POLYPECTOMY    . CORONARY ANGIOPLASTY    . HERNIA REPAIR Bilateral    x 2   different times  . PROSTATE SURGERY     had cancer; radical resection 1997        Family History  Problem Relation Age of Onset  . Colon cancer Father        possibly age 41 y.o diagnosed   . Stroke  Mother   . Asthma Sister     Social History   Tobacco Use  . Smoking status: Former Smoker    Years: 15.00    Types: Cigarettes    Quit date: 09/08/1985    Years since quitting: 33.7  . Smokeless tobacco: Never Used  Substance Use Topics  . Alcohol use: Not Currently    Comment: seldom  . Drug use: No    Home Medications Prior to Admission medications   Medication Sig Start Date End Date Taking? Authorizing Provider  amLODipine (NORVASC) 10 MG tablet TAKE 1 TABLET(10 MG) BY MOUTH DAILY 05/23/19   Deberah Pelton, NP  atorvastatin (LIPITOR) 40 MG tablet TAKE 1 TABLET(40 MG) BY MOUTH DAILY 12/12/18   Troy Sine, MD  Cholecalciferol (VITAMIN D) 2000 units CAPS Take 2,000 Units by mouth daily.    [provider]  clopidogrel (PLAVIX) 75 MG tablet TAKE 1 TABLET(75 MG) BY MOUTH DAILY 03/12/19   Deberah Pelton, NP  cyclobenzaprine (FLEXERIL) 5 MG tablet One tab po qpm prn 02/20/19   [name], MD  doxycycline (VIBRAMYCIN) 50 MG capsule Take 1 capsule (50 mg total) by mouth 2 (two) times daily for 7 days. 06/01/19 06/08/19  Fumiko Cham E, PA-C  EPINEPHrine (EPIPEN 2-PAK) 0.3  mg/0.3 mL IJ SOAJ injection Inject 0.3 mLs (0.3 mg total) into the muscle as needed for anaphylaxis. 02/20/19   [name], MD  hydrochlorothiazide (MICROZIDE) 12.5 MG capsule Take 1 capsule (12.5 mg total) by mouth daily. 08/29/18 08/29/19  [name], MD  lisinopril (ZESTRIL) 40 MG tablet TAKE 1 TABLET BY MOUTH DAILY 02/14/19   Troy Sine, MD  metoprolol tartrate (LOPRESSOR) 50 MG tablet TAKE 1 TABLET(50 MG) BY MOUTH TWICE DAILY 04/13/19   Troy Sine, MD  NITROSTAT 0.4 MG SL tablet PLACE 1 TABLET UNDER THE TONGUE EVERY 4 MINUTES AS NEEDED FOR CHEST PAIN. PLEASE MAKE APPOINTMENT 01/20/15   Carlena Bjornstad, MD    Allergies    Other, Penicillins, and Sulfa antibiotics  Review of Systems   Review of Systems All other systems are reviewed and are negative for acute change except as  noted in the HPI.  Physical Exam Updated Vital Signs BP (!) 152/86 (BP Location: Left Arm)   Pulse 62   Temp 97.7 F (36.5 C) (Oral)   Resp 16   Ht 5\' 10"  (1.778 m)   Wt 92.5 kg   SpO2 100%   BMI 29.27 kg/m   Physical Exam Vitals and nursing note reviewed.  Constitutional:      Appearance: He is well-developed. He is not ill-appearing or toxic-appearing.  HENT:     Head: Normocephalic and atraumatic.     Nose: Nose normal.  Eyes:     General: No scleral icterus.       Right eye: No discharge.        Left eye: No discharge.     Conjunctiva/sclera: Conjunctivae normal.  Neck:     Vascular: No JVD.  Cardiovascular:     Rate and Rhythm: Normal rate and regular rhythm.     Pulses: Normal pulses.     Heart sounds: Normal heart sounds.  Pulmonary:     Effort: Pulmonary effort is normal.     Breath sounds: Normal breath sounds.  Abdominal:     General: There is no distension.  Musculoskeletal:        General: Normal range of motion.     Cervical back: Normal range of motion.     Comments: Moving all extremities without signs of injury.  Skin:    General: Skin is warm and dry.     Comments: 1 x 1 cm area of erythema. No induration or palpable fluctuance. No active drainage. No tick seen  Neurological:     Mental Status: He is oriented to person, place, and time.     GCS: GCS eye subscore is 4. GCS verbal subscore is 5. GCS motor subscore is 6.     Comments: Fluent speech, no facial droop.  Psychiatric:        Behavior: Behavior normal.       ED Results / Procedures / Treatments   Labs (all labs ordered are listed, but only abnormal results are displayed) Labs Reviewed - No data to display  EKG None  Radiology No results found.  Procedures Procedures (including critical care time)  Medications Ordered in ED Medications  doxycycline (VIBRA-TABS) tablet 100 mg (100 mg Oral Given 06/01/19 1550)    ED Course  I have reviewed the triage vital signs and the  nursing notes.  Pertinent labs & imaging results that were available during my care of the patient were reviewed by me and considered in my medical decision making (see chart for details).  MDM Rules/Calculators/A&P                      Patient was thought to have initially left without being seen however that does not appear to be case as patient was sitting in the lobby waiting to be called for a room assignment.   Patient seen and examined. Patient presents awake, alert, hemodynamically stable, afebrile, non toxic.  He is well-appearing.  He has a small area of erythema on his right thigh without palpable fluctuance or induration.  No active drainage.  He removed the tick yesterday and believes he got the entire head and body.  I circled the area with a skin marker and advised patient to watch the area for the erythema to spread, streak or other signs of infection.  Will cover with doxycycline for possible infection.   The patient appears reasonably screened and/or stabilized for discharge and I doubt any other medical condition or other [facility] requiring further screening, evaluation, or treatment in the ED at this time prior to discharge. The patient is safe for discharge with strict return precautions discussed. Recommend pcp follow up in 2 days for symptom recheck. The patient was discussed with and seen by Dr. Ralene Bathe who agrees with the treatment plan.  Portions of this note were generated with [employer]. Dictation errors may occur despite best attempts at proofreading.    Final Clinical Impression(s) / ED Diagnoses Final diagnoses:  Tick bite, initial encounter    Rx / DC Orders ED Discharge Orders         Ordered    doxycycline (VIBRAMYCIN) 50 MG capsule  2 times daily     06/01/19 1552           Clement Deneault, Harley Hallmark, PA-C 06/01/19 1558    Quintella Reichert, MD 06/03/19 [phone]

## 2019-06-01 NOTE — Discharge Instructions (Addendum)
You have been seen today for a tick bite. Please read and follow all provided instructions. Return to the emergency room for worsening condition or new concerning symptoms.    1. Medications:  Prescription sent to your pharmacy for doxycycline. This is an antibiotic. Please take as prescribed.  Continue usual home medications Take medications as prescribed. Please review all of the medicines and only take them if you do not have an allergy to them.   2. Treatment: rest, drink plenty of fluids -The area of redness on your thigh were circled with a marker.  Please watch this closely.  If the redness extends outside the circle please return to the emergency department immediately.   3. Follow Up:  Please follow up with primary care provider in 2-5 days for symptom recheck.   It is also a possibility that you have an allergic reaction to any of the medicines that you have been prescribed - Everybody reacts differently to medications and while MOST people have no trouble with most medicines, you may have a reaction such as nausea, vomiting, rash, swelling, shortness of breath. If this is the case, please stop taking the medicine immediately and contact your physician.  ?

## 2019-06-01 NOTE — ED Provider Notes (Signed)
Patient left prior to being evaluated by provider from the waiting room. I did not personally evaluate or partake in patient's care.   Liddie Chichester A, PA-C 06/01/19 1518    Charlesetta Shanks, MD 06/12/19 1528

## 2019-06-01 NOTE — ED Notes (Signed)
Pt verbalizes understanding of d/c instructions. Prescriptions reviewed with patient. Pt ambulatory at d/c with all belongings.  

## 2019-06-01 NOTE — ED Triage Notes (Signed)
Removed  Tick from rt thigh yesterday no s/s but wants to be checked

## 2019-06-10 ENCOUNTER — Other Ambulatory Visit: Payer: Self-pay | Admitting: Cardiovascular Disease

## 2019-06-10 ENCOUNTER — Other Ambulatory Visit: Payer: Self-pay | Admitting: General Practice

## 2019-07-11 ENCOUNTER — Other Ambulatory Visit: Payer: Self-pay | Admitting: Internal Medicine

## 2019-08-30 ENCOUNTER — Other Ambulatory Visit: Payer: Self-pay

## 2019-08-30 ENCOUNTER — Ambulatory Visit (INDEPENDENT_AMBULATORY_CARE_PROVIDER_SITE_OTHER): Payer: Medicare Other

## 2019-08-30 ENCOUNTER — Encounter: Payer: Self-pay | Admitting: Internal Medicine

## 2019-08-30 ENCOUNTER — Ambulatory Visit (INDEPENDENT_AMBULATORY_CARE_PROVIDER_SITE_OTHER): Payer: Medicare Other | Admitting: Internal Medicine

## 2019-08-30 VITALS — BP 136/70 | HR 51 | Temp 97.9°F | Ht 70.0 in | Wt 208.0 lb

## 2019-08-30 VITALS — BP 136/70 | HR 51 | Temp 97.9°F | Ht 70.0 in | Wt 208.6 lb

## 2019-08-30 DIAGNOSIS — I119 Hypertensive heart disease without heart failure: Secondary | ICD-10-CM

## 2019-08-30 DIAGNOSIS — Z0000 Encounter for general adult medical examination without abnormal findings: Secondary | ICD-10-CM | POA: Diagnosis not present

## 2019-08-30 DIAGNOSIS — R42 Dizziness and giddiness: Secondary | ICD-10-CM

## 2019-08-30 DIAGNOSIS — E663 Overweight: Secondary | ICD-10-CM | POA: Diagnosis not present

## 2019-08-30 DIAGNOSIS — Z1159 Encounter for screening for other viral diseases: Secondary | ICD-10-CM

## 2019-08-30 DIAGNOSIS — I2510 Atherosclerotic heart disease of native coronary artery without angina pectoris: Secondary | ICD-10-CM | POA: Diagnosis not present

## 2019-08-30 NOTE — Patient Instructions (Signed)
[name] , Thank you for taking time to come for your Medicare Wellness Visit. I appreciate your ongoing commitment to your health goals. Please review the following plan we discussed and let me know if I can assist you in the future.   Screening recommendations/referrals: Colonoscopy: not required Recommended yearly ophthalmology/optometry visit for glaucoma screening and checkup Recommended yearly dental visit for hygiene and checkup  Vaccinations: Influenza vaccine: completed 02/27/2019, due 09/30/2019 Pneumococcal vaccine: completed 02/27/2019 Tdap vaccine: completed 04/01/2016, due 04/01/2026 Shingles vaccine: discussed   Covid-19: 04/27/2019, 04/06/2019  Advanced directives: Advance directive discussed with you today. Even though you declined this today please call our office should you change your mind and we can give you the proper paperwork for you to fill out.  Conditions/risks identified: none  Next appointment: 09/11/2020 at 10:00 Follow up in one year for your annual wellness visit.   Preventive Care 6 Years and Older, Male Preventive care refers to lifestyle choices and visits with your health care provider that can promote health and wellness. What does preventive care include?  A yearly physical exam. This is also called an annual well check.  Dental exams once or twice a year.  Routine eye exams. Ask your health care provider how often you should have your eyes checked.  Personal lifestyle choices, including:  Daily care of your teeth and gums.  Regular physical activity.  Eating a healthy diet.  Avoiding tobacco and drug use.  Limiting alcohol use.  Practicing safe sex.  Taking low doses of aspirin every day.  Taking vitamin and mineral supplements as recommended by your health care provider. What happens during an annual well check? The services and screenings done by your health care provider during your annual well check will depend on your age, overall  health, lifestyle risk factors, and family history of disease. Counseling  Your health care provider may ask you questions about your:  Alcohol use.  Tobacco use.  Drug use.  Emotional well-being.  Home and relationship well-being.  Sexual activity.  Eating habits.  History of falls.  Memory and ability to understand (cognition).  Work and work Statistician. Screening  You may have the following tests or measurements:  Height, weight, and BMI.  Blood pressure.  Lipid and cholesterol levels. These may be checked every 5 years, or more frequently if you are over 66 years old.  Skin check.  Lung cancer screening. You may have this screening every year starting at age 84 if you have a 30-pack-year history of smoking and currently smoke or have quit within the past 15 years.  Fecal occult blood test (FOBT) of the stool. You may have this test every year starting at age 40.  Flexible sigmoidoscopy or colonoscopy. You may have a sigmoidoscopy every 5 years or a colonoscopy every 10 years starting at age 38.  Prostate cancer screening. Recommendations will vary depending on your family history and other risks.  Hepatitis C blood test.  Hepatitis B blood test.  Sexually transmitted disease (STD) testing.  Diabetes screening. This is done by checking your blood sugar (glucose) after you have not eaten for a while (fasting). You may have this done every 1-3 years.  Abdominal aortic aneurysm (AAA) screening. You may need this if you are a current or former smoker.  Osteoporosis. You may be screened starting at age 44 if you are at high risk. Talk with your health care provider about your test results, treatment options, and if necessary, the need for more  tests. Vaccines  Your health care provider may recommend certain vaccines, such as:  Influenza vaccine. This is recommended every year.  Tetanus, diphtheria, and acellular pertussis (Tdap, Td) vaccine. You may need a Td  booster every 10 years.  Zoster vaccine. You may need this after age 65.  Pneumococcal 13-valent conjugate (PCV13) vaccine. One dose is recommended after age 47.  Pneumococcal polysaccharide (PPSV23) vaccine. One dose is recommended after age 72. Talk to your health care provider about which screenings and vaccines you need and how often you need them. This information is not intended to replace advice given to you by your health care provider. Make sure you discuss any questions you have with your health care provider. Document Released: 03/14/2015 Document Revised: 11/05/2015 Document Reviewed: 12/17/2014 Elsevier Interactive Patient Education  2017 [place] Prevention in the Home Falls can cause injuries. They can happen to people of all ages. There are many things you can do to make your home safe and to help prevent falls. What can I do on the outside of my home?  Regularly fix the edges of walkways and driveways and fix any cracks.  Remove anything that might make you trip as you walk through a door, such as a raised step or threshold.  Trim any bushes or trees on the path to your home.  Use bright outdoor lighting.  Clear any walking paths of anything that might make someone trip, such as rocks or tools.  Regularly check to see if handrails are loose or broken. Make sure that both sides of any steps have handrails.  Any raised decks and porches should have guardrails on the edges.  Have any leaves, snow, or ice cleared regularly.  Use sand or salt on walking paths during winter.  Clean up any spills in your garage right away. This includes oil or grease spills. What can I do in the bathroom?  Use night lights.  Install grab bars by the toilet and in the tub and shower. Do not use towel bars as grab bars.  Use non-skid mats or decals in the tub or shower.  If you need to sit down in the shower, use a plastic, non-slip stool.  Keep the floor dry. Clean up  any water that spills on the floor as soon as it happens.  Remove soap buildup in the tub or shower regularly.  Attach bath mats securely with double-sided non-slip rug tape.  Do not have throw rugs and other things on the floor that can make you trip. What can I do in the bedroom?  Use night lights.  Make sure that you have a light by your bed that is easy to reach.  Do not use any sheets or blankets that are too big for your bed. They should not hang down onto the floor.  Have a firm chair that has side arms. You can use this for support while you get dressed.  Do not have throw rugs and other things on the floor that can make you trip. What can I do in the kitchen?  Clean up any spills right away.  Avoid walking on wet floors.  Keep items that you use a lot in easy-to-reach places.  If you need to reach something above you, use a strong step stool that has a grab bar.  Keep electrical cords out of the way.  Do not use floor polish or wax that makes floors slippery. If you must use wax, use non-skid floor  wax.  Do not have throw rugs and other things on the floor that can make you trip. What can I do with my stairs?  Do not leave any items on the stairs.  Make sure that there are handrails on both sides of the stairs and use them. Fix handrails that are broken or loose. Make sure that handrails are as long as the stairways.  Check any carpeting to make sure that it is firmly attached to the stairs. Fix any carpet that is loose or worn.  Avoid having throw rugs at the top or bottom of the stairs. If you do have throw rugs, attach them to the floor with carpet tape.  Make sure that you have a light switch at the top of the stairs and the bottom of the stairs. If you do not have them, ask someone to add them for you. What else can I do to help prevent falls?  Wear shoes that:  Do not have high heels.  Have rubber bottoms.  Are comfortable and fit you well.  Are  closed at the toe. Do not wear sandals.  If you use a stepladder:  Make sure that it is fully opened. Do not climb a closed stepladder.  Make sure that both sides of the stepladder are locked into place.  Ask someone to hold it for you, if possible.  Clearly mark and make sure that you can see:  Any grab bars or handrails.  First and last steps.  Where the edge of each step is.  Use tools that help you move around (mobility aids) if they are needed. These include:  Canes.  Walkers.  Scooters.  Crutches.  Turn on the lights when you go into a dark area. Replace any light bulbs as soon as they burn out.  Set up your furniture so you have a clear path. Avoid moving your furniture around.  If any of your floors are uneven, fix them.  If there are any pets around you, be aware of where they are.  Review your medicines with your doctor. Some medicines can make you feel dizzy. This can increase your chance of falling. Ask your doctor what other things that you can do to help prevent falls. This information is not intended to replace advice given to you by your health care provider. Make sure you discuss any questions you have with your health care provider. Document Released: 12/12/2008 Document Revised: 07/24/2015 Document Reviewed: 03/22/2014 Elsevier Interactive Patient Education  2017 [employer].

## 2019-08-30 NOTE — Patient Instructions (Signed)
[place] water   Dizziness Dizziness is a common problem. It makes you feel unsteady or light-headed. You may feel like you are about to pass out (faint). Dizziness can lead to getting hurt if you stumble or fall. Dizziness can be caused by many things, including:  Medicines.  Not having enough water in your body (dehydration).  Illness. Follow these instructions at home: Eating and drinking   Drink enough fluid to keep your pee (urine) clear or pale yellow. This helps to keep you from getting dehydrated. Try to drink more clear fluids, such as water.  Do not drink alcohol.  Limit how much caffeine you drink or eat, if your doctor tells you to do that.  Limit how much salt (sodium) you drink or eat, if your doctor tells you to do that. Activity   Avoid making quick movements. ? When you stand up from sitting in a chair, steady yourself until you feel okay. ? In the morning, first sit up on the side of the bed. When you feel okay, stand slowly while you hold onto something. Do this until you know that your balance is fine.  If you need to stand in one place for a long time, move your legs often. Tighten and relax the muscles in your legs while you are standing.  Do not drive or use heavy machinery if you feel dizzy.  Avoid bending down if you feel dizzy. Place items in your home so you can reach them easily without leaning over. Lifestyle  Do not use any products that contain nicotine or tobacco, such as cigarettes and e-cigarettes. If you need help quitting, ask your doctor.  Try to lower your stress level. You can do this by using methods such as yoga or meditation. Talk with your doctor if you need help. General instructions  Watch your dizziness for any changes.  Take over-the-counter and prescription medicines only as told by your doctor. Talk with your doctor if you think that you are dizzy because of a medicine that you are taking.  Tell a friend or a family member  that you are feeling dizzy. If he or she notices any changes in your behavior, have this person call your doctor.  Keep all follow-up visits as told by your doctor. This is important. Contact a doctor if:  Your dizziness does not go away.  Your dizziness or light-headedness gets worse.  You feel sick to your stomach (nauseous).  You have trouble hearing.  You have new symptoms.  You are unsteady on your feet.  You feel like the room is spinning. Get help right away if:  You throw up (vomit) or have watery poop (diarrhea), and you cannot eat or drink anything.  You have trouble: ? Talking. ? Walking. ? Swallowing. ? Using your arms, hands, or legs.  You feel generally weak.  You are not thinking clearly, or you have trouble forming sentences. A friend or family member may notice this.  You have: ? Chest pain. ? Pain in your belly (abdomen). ? Shortness of breath. ? Sweating.  Your vision changes.  You are bleeding.  You have a very bad headache.  You have neck pain or a stiff neck.  You have a fever. These symptoms may be an emergency. Do not wait to see if the symptoms will go away. Get medical help right away. Call your local emergency services (911 in the [place] Do not drive yourself to the hospital. Summary  Dizziness makes you feel  unsteady or light-headed. You may feel like you are about to pass out (faint).  Drink enough fluid to keep your pee (urine) clear or pale yellow. Do not drink alcohol.  Avoid making quick movements if you feel dizzy.  Watch your dizziness for any changes. This information is not intended to replace advice given to you by your health care provider. Make sure you discuss any questions you have with your health care provider. Document Revised: 02/18/2017 Document Reviewed: 03/04/2016 Elsevier Patient Education  [place].

## 2019-08-30 NOTE — Progress Notes (Signed)
This visit occurred during the SARS-CoV-2 public health emergency.  Safety protocols were in place, including screening questions prior to the visit, additional usage of staff PPE, and extensive cleaning of exam room while observing appropriate contact time as indicated for disinfecting solutions.  Subjective:   Eugene Murray is a 79 y.o. male who presents for Medicare Annual/Subsequent preventive examination.  Review of Systems     Cardiac Risk Factors include: advanced age (>36men, >12 women);hypertension;male gender;sedentary lifestyle     Objective:    Today's Vitals   08/30/19 1030  BP: 136/70  Pulse: (!) 51  Temp: 97.9 F (36.6 C)  TempSrc: Oral  Weight: 208 lb (94.3 kg)  Height: 5\' 10"  (1.778 m)   Body mass index is 29.84 kg/m.  Advanced Directives 08/30/2019 08/29/2018 03/19/2015 08/21/2014 06/30/2012  Does Patient Have a Medical Advance Directive? No No No No Patient does not have advance directive;Patient would not like information  Would patient like information on creating a medical advance directive? No - Patient declined No - Patient declined No - patient declined information Yes - Educational materials given -    Current Medications (verified) Outpatient Encounter Medications as of 08/30/2019  Medication Sig  . amLODipine (NORVASC) 10 MG tablet TAKE 1 TABLET(10 MG) BY MOUTH DAILY  . atorvastatin (LIPITOR) 40 MG tablet TAKE 1 TABLET(40 MG) BY MOUTH DAILY  . Cholecalciferol (VITAMIN D) 2000 units CAPS Take 2,000 Units by mouth daily.  . clopidogrel (PLAVIX) 75 MG tablet TAKE 1 TABLET(75 MG) BY MOUTH DAILY  . cyclobenzaprine (FLEXERIL) 5 MG tablet One tab po qpm prn (Patient not taking: Reported on 08/29/2019)  . EPINEPHrine (EPIPEN 2-PAK) 0.3 mg/0.3 mL IJ SOAJ injection Inject 0.3 mLs (0.3 mg total) into the muscle as needed for anaphylaxis.  . hydrochlorothiazide (MICROZIDE) 12.5 MG capsule TAKE 1 CAPSULE(12.5 MG) BY MOUTH DAILY  . lisinopril (ZESTRIL) 40 MG tablet TAKE  1 TABLET BY MOUTH DAILY  . metoprolol tartrate (LOPRESSOR) 50 MG tablet TAKE 1 TABLET(50 MG) BY MOUTH TWICE DAILY  . NITROSTAT 0.4 MG SL tablet PLACE 1 TABLET UNDER THE TONGUE EVERY 4 MINUTES AS NEEDED FOR CHEST PAIN. PLEASE MAKE APPOINTMENT (Patient not taking: Reported on 08/29/2019)   No facility-administered encounter medications on file as of 08/30/2019.    Allergies (verified) Other, Penicillins, and Sulfa antibiotics   History: Past Medical History:  Diagnosis Date  . Allergic rhinitis   . Cancer [facility])    Prostate cancer  . Coronary artery disease   . History of prostate cancer   . Hyperlipidemia   . Hypertension   . Hypokalemia   . Nephritis    as a child  . OSA (obstructive sleep apnea)    cpap machine at home  . Stroke [facility])    "mini stroke" TIA   Past Surgical History:  Procedure Laterality Date  . CARDIAC CATHETERIZATION    . CAROTID STENT  11-08 and 4-11   stent x 3 (12/2006 x 2; 2011 x 1 stent)  . CATARACT EXTRACTION W/PHACO Left 08/21/2014   Procedure: PHACO EMULSION CATARACT EXTRACTION AND INTRAOCULAR LENS PLACEMENT ([place]) IMPLANT LEFT EYE;  Surgeon: Marylynn Pearson, MD;  Location: [place];  Service: Ophthalmology;  Laterality: Left;  . COLONOSCOPY  2015  . COLONOSCOPY W/ POLYPECTOMY    . CORONARY ANGIOPLASTY    . HERNIA REPAIR Bilateral    x 2   different times  . PROSTATE SURGERY     had cancer; radical resection 1997    Family  History  Problem Relation Age of Onset  . Colon cancer Father        possibly age 59 y.o diagnosed   . Stroke Mother   . Asthma Sister    Social History   Socioeconomic History  . Marital status: Married    Spouse name: Hart Robinsons  . Number of children: 3  . Years of education: College  . Highest education level: Not on file  Occupational History  . Occupation: retired  Tobacco Use  . Smoking status: Former Smoker    Years: 15.00    Types: Cigarettes    Quit date: 09/08/1985    Years since quitting: 33.9  . Smokeless tobacco:  Never Used  Vaping Use  . Vaping Use: Never used  Substance and Sexual Activity  . Alcohol use: Not Currently    Comment: seldom  . Drug use: No  . Sexual activity: Yes  Other Topics Concern  . Not on file  Social History Narrative   3 kids    Retired Arboriculturist    Former smoker quit 1980s. Denies etOH, other drugs    Patient lives at home with family. Patient lives at home with his wife Hart Robinsons)   Caffeine Use: 1 glass of tea every other day   Social Determinants of Health   Financial Resource Strain: Low Risk   . Difficulty of Paying Living Expenses: Not hard at all  Food Insecurity: No Food Insecurity  . Worried About Charity fundraiser in the Last Year: Never true  . Ran Out of Food in the Last Year: Never true  Transportation Needs: No Transportation Needs  . Lack of Transportation (Medical): No  . Lack of Transportation (Non-Medical): No  Physical Activity: Insufficiently Active  . Days of Exercise per Week: 3 days  . Minutes of Exercise per Session: 20 min  Stress: No Stress Concern Present  . Feeling of Stress : Not at all  Social Connections:   . Frequency of Communication with Friends and Family:   . Frequency of Social Gatherings with Friends and Family:   . Attends Religious Services:   . Active Member of Clubs or Organizations:   . Attends Archivist Meetings:   Marland Kitchen Marital Status:     Tobacco Counseling Counseling given: Not Answered   Clinical Intake:  Pre-visit preparation completed: Yes  Pain : No/denies pain     Nutritional Status: BMI 25 -29 Overweight Nutritional Risks: None Diabetes: No  How often do you need to have someone help you when you read instructions, pamphlets, or other written materials from your doctor or pharmacy?: 1 - Never What is the last grade level you completed in school?: 3 yrs college  Diabetic? no  Interpreter Needed?: No  Information entered by :: NAllen LPN   Activities of Daily Living In your  present state of health, do you have any difficulty performing the following activities: 08/30/2019 08/30/2019  Hearing? N N  Vision? Y Y  Comment if forget eye drops -  Difficulty concentrating or making decisions? N N  Walking or climbing stairs? N N  Dressing or bathing? N N  Doing errands, shopping? N N  Preparing Food and eating ? N -  Using the Toilet? N -  In the past six months, have you accidently leaked urine? Y -  Do you have problems with loss of bowel control? N -  Managing your Medications? N -  Managing your Finances? N -  Housekeeping or managing your Housekeeping?  N -  Some recent data might be hidden    Patient Care Team: [name], MD as PCP - General (Internal Medicine) Troy Sine, MD as PCP - Cardiology (Cardiology) Little, Claudette Stapler, RN as Case Manager  Indicate any recent Medical Services you may have received from other than [facility] providers in the past year (date may be approximate).     Assessment:   This is a routine wellness examination for Yashas.  Hearing/Vision screen  Hearing Screening   125Hz  250Hz  500Hz  1000Hz  2000Hz  3000Hz  4000Hz  6000Hz  8000Hz   Right ear:           Left ear:           Vision Screening Comments: Regular eye exams. Dr. Venetia Maxon   Dietary issues and exercise activities discussed: Current Exercise Habits: Home exercise routine, Type of exercise: walking, Time (Minutes): 20, Frequency (Times/Week): 3, Weekly Exercise (Minutes/Week): 60  Goals    .  "I would like to get my blood pressure a littler lower" (pt-stated)      Current Barriers:  Marland Kitchen Knowledge Deficits related to disease process and Self Health management of HTN  Nurse Case Manager Clinical Goal(s):  Marland Kitchen Over the next 90 days, patient will demonstrate a decrease in Hypertensive exacerbations as evidenced by patient will maintain a BP of 130/80 or lower . Over the next 90 days, patient will Self monitor his BP at home and will record his BP readings, reporting  abnormal readings to the CCM team and or PCP/Cardiology  CCM RN CM Interventions:  12/29/18 call completed with patient   . Evaluation of current treatment plan related to HTN and patient's adherence to plan as established by provider. . Provided education to patient re: target BP range of <130/80; discussed non pharmacological ways to lower BP, including, implementation of exercise into daily routine, stay well hydrated while adhering to a heart healthy low sodium diet, dicussed living an overall healthy lifestyle avoiding alcohol and tobacco products and taking prescribed medications; discussed outcome of patient recent f/u with Cardiology and discussed recommendations given and next f/u appt . Reviewed medications with patient and discussed indication of Hypertensive/Cardiac medications, including dosage and frequency; instructed patient on importance of taking these medications exactly as prescribed at the same time every day for best results . Discussed plans with patient for ongoing care management follow up and provided patient with direct contact information for care management team . Provided patient with printed educational materials related to What is High Blood Pressure; High Blood Pressure and Stroke; Why Should I Limit Salt; What About African Americans High Blood Pressure; Life's Simple 7 Eat Better  Patient Self Care Activities:  . Self administers medications as prescribed . Attends all scheduled provider appointments . Calls pharmacy for medication refills . Performs ADL's independently . Performs IADL's independently . Calls provider office for new concerns or questions  Initial goal documentation     .  Exercise 150 min/wk Moderate Activity    .  Patient Stated      08/30/2019, wants to lose 5 pounds      Depression Screen PHQ 2/9 Scores 08/30/2019 08/30/2019 08/29/2018 08/29/2018  PHQ - 2 Score 0 0 0 0  PHQ- 9 Score 0 - 0 -    Fall Risk Fall Risk  08/30/2019 02/20/2019  08/29/2018 08/29/2018  Falls in the past year? 0 0 0 0  Risk for fall due to : Medication side effect - - -  Follow up Falls evaluation completed;Education provided;Falls  prevention discussed - Falls evaluation completed;Education provided;Falls prevention discussed -    Any stairs in or around the home? Yes  If so, are there any without handrails? Yes  Home free of loose throw rugs in walkways, pet beds, electrical cords, etc? Yes  Adequate lighting in your home to reduce risk of falls? Yes   ASSISTIVE DEVICES UTILIZED TO PREVENT FALLS:  Life alert? No  Use of a cane, walker or w/c? No  Grab bars in the bathroom? No  Shower chair or bench in shower? Yes  Elevated toilet seat or a handicapped toilet? No   TIMED UP AND GO:  Was the test performed? No .   Gait steady and fast without use of assistive device  Cognitive Function:     6CIT Screen 08/30/2019 08/29/2018  What Year? 0 points 0 points  What month? 0 points 0 points  What time? 0 points 0 points  Count back from 20 0 points 0 points  Months in reverse 0 points 0 points  Repeat phrase 0 points 0 points  Total Score 0 0    Immunizations Immunization History  Administered Date(s) Administered  . Influenza, High Dose Seasonal PF 02/27/2019  . Influenza,inj,Quad PF,6+ Mos 12/05/2012, 12/06/2013, 02/10/2015, 02/10/2016  . PFIZER SARS-COV-2 Vaccination 04/06/2019, 04/27/2019  . Pneumococcal Polysaccharide-23 02/20/2019  . Zoster 01/10/2013    TDAP status: Up to date Flu Vaccine status: Up to date Pneumococcal vaccine status: Up to date Covid-19 vaccine status: Completed vaccines  Qualifies for Shingles Vaccine? Yes   Zostavax completed Yes   Shingrix Completed?: No.    Education has been provided regarding the importance of this vaccine. Patient has been advised to call insurance company to determine out of pocket expense if they have not yet received this vaccine. Advised may also receive vaccine at local pharmacy  or Health Dept. Verbalized acceptance and understanding.  Screening Tests Health Maintenance  Topic Date Due  . Hepatitis C Screening  Never done  . INFLUENZA VACCINE  09/30/2019  . TETANUS/TDAP  04/01/2026  . COVID-19 Vaccine  Completed  . PNA vac Low Risk Adult  Completed    Health Maintenance  Health Maintenance Due  Topic Date Due  . Hepatitis C Screening  Never done    Colorectal cancer screening: No longer required.   Lung Cancer Screening: (Low Dose CT Chest recommended if Age 72-80 years, 30 pack-year currently smoking OR have quit w/in 15years.) does not qualify.   Lung Cancer Screening Referral: no  Additional Screening:  Hepatitis C Screening: does qualify; Completed today  Vision Screening: Recommended annual ophthalmology exams for early detection of glaucoma and other disorders of the eye. Is the patient up to date with their annual eye exam?  Yes  Who is the provider or what is the name of the office in which the patient attends annual eye exams? Dr. Venetia Maxon If pt is not established with a provider, would they like to be referred to a provider to establish care? No .   Dental Screening: Recommended annual dental exams for proper oral hygiene  Community Resource Referral / Chronic Care Management: CRR required this visit?  No   CCM required this visit?  No      Plan:     I have personally reviewed and noted the following in the patient's chart:   . Medical and social history . Use of alcohol, tobacco or illicit drugs  . Current medications and supplements . Functional ability and status . Nutritional status .  Physical activity . Advanced directives . List of other physicians . Hospitalizations, surgeries, and ER visits in previous 12 months . Vitals . Screenings to include cognitive, depression, and falls . Referrals and appointments  In addition, I have reviewed and discussed with patient certain preventive protocols, quality metrics, and  best practice recommendations. A written personalized care plan for preventive services as well as general preventive health recommendations were provided to patient.     Kellie Simmering, LPN   09/07/1502   Nurse Notes:

## 2019-08-31 LAB — CMP14+EGFR
ALT: 18 IU/L (ref 0–44)
AST: 25 IU/L (ref 0–40)
Albumin/Globulin Ratio: 1.5 (ref 1.2–2.2)
Albumin: 4.1 g/dL (ref 3.7–4.7)
Alkaline Phosphatase: 75 IU/L (ref 48–121)
BUN/Creatinine Ratio: 13 (ref 10–24)
BUN: 14 mg/dL (ref 8–27)
Bilirubin Total: 0.6 mg/dL (ref 0.0–1.2)
CO2: 25 mmol/L (ref 20–29)
Calcium: 9 mg/dL (ref 8.6–10.2)
Chloride: 105 mmol/L (ref 96–106)
Creatinine, Ser: 1.07 mg/dL (ref 0.76–1.27)
GFR calc Af Amer: 76 mL/min/{1.73_m2} (ref >59)
GFR calc non Af Amer: 66 mL/min/{1.73_m2} (ref >59)
Globulin, Total: 2.8 g/dL (ref 1.5–4.5)
Glucose: 83 mg/dL (ref 65–99)
Potassium: 3.7 mmol/L (ref 3.5–5.2)
Sodium: 142 mmol/L (ref 134–144)
Total Protein: 6.9 g/dL (ref 6.0–8.5)

## 2019-08-31 LAB — HEPATITIS C ANTIBODY: Hep C Virus Ab: <0.1 s/co ratio (ref 0.0–0.9)

## 2019-08-31 LAB — LIPID PANEL
Chol/HDL Ratio: 3 ratio (ref 0.0–5.0)
Cholesterol, Total: 121 mg/dL (ref 100–199)
HDL: 41 mg/dL (ref >39)
LDL Chol Calc (NIH): 66 mg/dL (ref 0–99)
Triglycerides: 65 mg/dL (ref 0–149)
VLDL Cholesterol Cal: 14 mg/dL (ref 5–40)

## 2019-09-02 NOTE — Progress Notes (Signed)
This visit occurred during the SARS-CoV-2 public health emergency.  Safety protocols were in place, including screening questions prior to the visit, additional usage of staff PPE, and extensive cleaning of exam room while observing appropriate contact time as indicated for disinfecting solutions.  Subjective:     Patient ID: Eugene Murray , male    DOB: 12/11/1940 , 79 y.o.   MRN: [id number]   Chief Complaint  Patient presents with  . Hypertension    HPI  Hypertension This is a chronic problem. The current episode started more than 1 year ago. The problem has been gradually improving since onset. The problem is controlled. Risk factors for coronary artery disease include dyslipidemia and male gender. The current treatment provides moderate improvement. Hypertensive end-organ damage includes CAD/MI.     Past Medical History:  Diagnosis Date  . Allergic rhinitis   . Cancer (HCC)    Prostate cancer  . Coronary artery disease   . History of prostate cancer   . Hyperlipidemia   . Hypertension   . Hypokalemia   . Nephritis    as a child  . OSA (obstructive sleep apnea)    cpap machine at home  . Stroke (HCC)    "mini stroke" TIA     Family History  Problem Relation Age of Onset  . Colon cancer Father        possibly age 57 y.o diagnosed   . Stroke Mother   . Asthma Sister      Current Outpatient Medications:  .  amLODipine (NORVASC) 10 MG tablet, TAKE 1 TABLET(10 MG) BY MOUTH DAILY, Disp: 90 tablet, Rfl: 1 .  atorvastatin (LIPITOR) 40 MG tablet, TAKE 1 TABLET(40 MG) BY MOUTH DAILY, Disp: 90 tablet, Rfl: 0 .  Cholecalciferol (VITAMIN D) 2000 units CAPS, Take 2,000 Units by mouth daily., Disp: , Rfl:  .  clopidogrel (PLAVIX) 75 MG tablet, TAKE 1 TABLET(75 MG) BY MOUTH DAILY, Disp: 90 tablet, Rfl: 0 .  EPINEPHrine (EPIPEN 2-PAK) 0.3 mg/0.3 mL IJ SOAJ injection, Inject 0.3 mLs (0.3 mg total) into the muscle as needed for anaphylaxis., Disp: 2 each, Rfl: 1 .   hydrochlorothiazide (MICROZIDE) 12.5 MG capsule, TAKE 1 CAPSULE(12.5 MG) BY MOUTH DAILY, Disp: 90 capsule, Rfl: 2 .  lisinopril (ZESTRIL) 40 MG tablet, TAKE 1 TABLET BY MOUTH DAILY, Disp: 90 tablet, Rfl: 3 .  magnesium oxide (MAG-OX) 400 MG tablet, Take 400 mg by mouth daily., Disp: , Rfl:  .  metoprolol tartrate (LOPRESSOR) 50 MG tablet, TAKE 1 TABLET(50 MG) BY MOUTH TWICE DAILY, Disp: 60 tablet, Rfl: 6 .  cyclobenzaprine (FLEXERIL) 5 MG tablet, One tab po qpm prn (Patient not taking: Reported on 08/29/2019), Disp: 20 tablet, Rfl: 0 .  NITROSTAT 0.4 MG SL tablet, PLACE 1 TABLET UNDER THE TONGUE EVERY 4 MINUTES AS NEEDED FOR CHEST PAIN. PLEASE MAKE APPOINTMENT (Patient not taking: Reported on 08/29/2019), Disp: 25 tablet, Rfl: 0   Allergies  Allergen Reactions  . Other Hives    SEAFOOD  . Penicillins Other (See Comments)    "caused me to pass out"  . Sulfa Antibiotics Hives     Review of Systems  Constitutional: Negative.   Respiratory: Negative.   Cardiovascular: Negative.   Gastrointestinal: Negative.   Neurological: Positive for dizziness.       He c/o intermittent dizziness. No associated visual disturbance. Occurs with positional changes. Room not spinning when turning over in bed.   Psychiatric/Behavioral: Negative.      Today's Vitals     08/30/19 1017  BP: 136/70  Pulse: (!) 51  Temp: 97.9 F (36.6 C)  TempSrc: Oral  Weight: 208 lb 9.6 oz (94.6 kg)  Height: 5' 10" (1.778 m)  PainSc: 0-No pain   Body mass index is 29.93 kg/m.   Objective:  Physical Exam Vitals and nursing note reviewed.  Constitutional:      Appearance: Normal appearance.  Cardiovascular:     Rate and Rhythm: Normal rate and regular rhythm.     Heart sounds: Normal heart sounds.  Pulmonary:     Effort: Pulmonary effort is normal.     Breath sounds: Normal breath sounds.  Skin:    General: Skin is warm.  Neurological:     General: No focal deficit present.     Mental Status: He is alert.   Psychiatric:        Mood and Affect: Mood normal.         Assessment And Plan:     1. Hypertensive heart disease without heart failure  Chronic, controlled. He will continue with current meds. He is encouraged to avoid adding salt to his foods. I will check renal function and lipid status today.  - CMP14+EGFR - Lipid panel  2. Coronary artery disease involving native coronary artery of native heart without angina pectoris  Chronic, also followed by Cardiology. Encouraged to follow heart healthy lifestyle and continue with statin use. Encouraged to exercise at least 150 minutes weekly.   3. Dizziness  Orthostatics performed, negative. Encouraged to stay well hydrated. He is advised to let us know if his sx persist.   4. Overweight (BMI 25.0-29.9)  BMI acceptable for his demographic.  He is encouraged to exercise no less than 150 minutes per week.     Maximino Greenland, MD    THE PATIENT IS ENCOURAGED TO PRACTICE SOCIAL DISTANCING DUE TO THE COVID-19 PANDEMIC.

## 2019-09-08 ENCOUNTER — Other Ambulatory Visit: Payer: Self-pay | Admitting: General Practice

## 2019-09-08 ENCOUNTER — Other Ambulatory Visit: Payer: Self-pay | Admitting: Cardiovascular Disease

## 2019-09-10 ENCOUNTER — Other Ambulatory Visit: Payer: Self-pay

## 2019-10-04 ENCOUNTER — Telehealth: Payer: Self-pay

## 2019-11-08 ENCOUNTER — Other Ambulatory Visit: Payer: Self-pay | Admitting: Cardiovascular Disease

## 2019-11-12 ENCOUNTER — Telehealth: Payer: Medicare Other

## 2019-11-12 ENCOUNTER — Telehealth: Payer: Self-pay

## 2019-11-12 NOTE — Telephone Encounter (Cosign Needed)
  Chronic Care Management   Outreach Note  11/12/2019 Name: Eugene Murray MRN: [id number] DOB: 1940/08/10  Referred by: [name], MD Reason for referral : Chronic Care Management (FU RN CM Call )   An unsuccessful telephone outreach was attempted today. The patient was referred to the case management team for assistance with care management and care coordination.   Follow Up Plan: A HIPAA compliant phone message was left for the patient providing contact information and requesting a return call.  Telephone follow up appointment with care management team member scheduled for: 12/17/19  Barb Merino, RN, BSN, CCM Care Management Coordinator [place] [facility]  Direct Phone: [phone]

## 2019-11-28 ENCOUNTER — Telehealth: Payer: Self-pay

## 2019-11-28 ENCOUNTER — Telehealth: Payer: Medicare Other

## 2019-11-28 NOTE — Telephone Encounter (Cosign Needed)
  Chronic Care Management   Outreach Note  11/28/2019 Name: Eugene Murray MRN: [id number] DOB: Feb 28, 1941  Referred by: [name], MD Reason for referral : Chronic Care Management (Inbound Call from patient )  Received voice message from patient advising he is returning my call. He requested a call back. A second unsuccessful telephone outreach was attempted today. The patient was referred to the case management team for assistance with care management and care coordination.   Follow Up Plan: Telephone follow up appointment with care management team member scheduled for: 12/17/19  Barb Merino, RN, BSN, CCM Care Management Coordinator [place] [facility]  Direct Phone: [phone]

## 2019-12-07 ENCOUNTER — Other Ambulatory Visit: Payer: Self-pay | Admitting: Cardiovascular Disease

## 2019-12-07 ENCOUNTER — Other Ambulatory Visit: Payer: Self-pay | Admitting: General Practice

## 2019-12-07 NOTE — Telephone Encounter (Signed)
Rx has been sent to the pharmacy electronically. ° °

## 2019-12-17 ENCOUNTER — Telehealth: Payer: Self-pay

## 2019-12-17 ENCOUNTER — Telehealth: Payer: Medicare Other

## 2019-12-17 NOTE — Telephone Encounter (Cosign Needed)
  Chronic Care Management   Outreach Note  12/17/2019 Name: Eugene Murray MRN: [id number] DOB: [birth year]  Referred by: [name], MD Reason for referral : Chronic Care Management (CCM RNCM FU Call )   An unsuccessful telephone outreach was attempted today. The patient was referred to the case management team for assistance with care management and care coordination. I spoke with [name] briefly, unfortunately, he is unable to speak with me at this time but would like to give me a call back.   Follow Up Plan: Telephone follow up appointment with care management team member scheduled for: 01/11/20  Barb Merino, RN, BSN, CCM Care Management Coordinator [place] [facility]  Direct Phone: [phone]

## 2020-01-11 ENCOUNTER — Telehealth: Payer: Medicare Other

## 2020-02-05 ENCOUNTER — Other Ambulatory Visit: Payer: Self-pay | Admitting: Cardiovascular Disease

## 2020-02-06 ENCOUNTER — Other Ambulatory Visit: Payer: Self-pay | Admitting: Cardiovascular Disease

## 2020-02-14 ENCOUNTER — Other Ambulatory Visit: Payer: Self-pay | Admitting: General Practice

## 2020-02-15 ENCOUNTER — Other Ambulatory Visit: Payer: Self-pay | Admitting: General Practice

## 2020-02-17 ENCOUNTER — Other Ambulatory Visit: Payer: Self-pay | Admitting: General Practice

## 2020-03-03 ENCOUNTER — Telehealth: Payer: Medicare Other

## 2020-03-05 ENCOUNTER — Ambulatory Visit (INDEPENDENT_AMBULATORY_CARE_PROVIDER_SITE_OTHER): Payer: Medicare Other | Admitting: Internal Medicine

## 2020-03-05 ENCOUNTER — Encounter: Payer: Self-pay | Admitting: Internal Medicine

## 2020-03-05 ENCOUNTER — Other Ambulatory Visit: Payer: Self-pay

## 2020-03-05 VITALS — BP 122/68 | HR 55 | Temp 98.5°F | Ht 70.0 in | Wt 209.0 lb

## 2020-03-05 DIAGNOSIS — Z23 Encounter for immunization: Secondary | ICD-10-CM

## 2020-03-05 DIAGNOSIS — I25118 Atherosclerotic heart disease of native coronary artery with other forms of angina pectoris: Secondary | ICD-10-CM

## 2020-03-05 DIAGNOSIS — E663 Overweight: Secondary | ICD-10-CM | POA: Diagnosis not present

## 2020-03-05 DIAGNOSIS — I119 Hypertensive heart disease without heart failure: Secondary | ICD-10-CM | POA: Diagnosis not present

## 2020-03-05 DIAGNOSIS — M79671 Pain in right foot: Secondary | ICD-10-CM | POA: Diagnosis not present

## 2020-03-05 DIAGNOSIS — Z6829 Body mass index (BMI) 29.0-29.9, adult: Secondary | ICD-10-CM

## 2020-03-05 DIAGNOSIS — R3915 Urgency of urination: Secondary | ICD-10-CM | POA: Diagnosis not present

## 2020-03-05 DIAGNOSIS — I2510 Atherosclerotic heart disease of native coronary artery without angina pectoris: Secondary | ICD-10-CM

## 2020-03-05 DIAGNOSIS — M79672 Pain in left foot: Secondary | ICD-10-CM

## 2020-03-05 LAB — POCT URINALYSIS DIPSTICK
Bilirubin, UA: NEGATIVE
Blood, UA: NEGATIVE
Glucose, UA: NEGATIVE
Ketones, UA: NEGATIVE
Leukocytes, UA: NEGATIVE
Nitrite, UA: NEGATIVE
Protein, UA: NEGATIVE
Spec Grav, UA: 1.025 (ref 1.010–1.025)
Urobilinogen, UA: 0.2 E.U./dL
pH, UA: 7 (ref 5.0–8.0)

## 2020-03-05 LAB — CMP14+EGFR
ALT: 14 IU/L (ref 0–44)
AST: 16 IU/L (ref 0–40)
Albumin/Globulin Ratio: 1.6 (ref 1.2–2.2)
Albumin: 4.2 g/dL (ref 3.7–4.7)
Alkaline Phosphatase: 78 IU/L (ref 44–121)
BUN/Creatinine Ratio: 10 (ref 10–24)
BUN: 12 mg/dL (ref 8–27)
Bilirubin Total: 0.7 mg/dL (ref 0.0–1.2)
CO2: 29 mmol/L (ref 20–29)
Calcium: 8.8 mg/dL (ref 8.6–10.2)
Chloride: 103 mmol/L (ref 96–106)
Creatinine, Ser: 1.2 mg/dL (ref 0.76–1.27)
GFR calc Af Amer: 66 mL/min/{1.73_m2} (ref >59)
GFR calc non Af Amer: 57 mL/min/{1.73_m2} — ABNORMAL LOW (ref >59)
Globulin, Total: 2.6 g/dL (ref 1.5–4.5)
Glucose: 78 mg/dL (ref 65–99)
Potassium: 4.2 mmol/L (ref 3.5–5.2)
Sodium: 142 mmol/L (ref 134–144)
Total Protein: 6.8 g/dL (ref 6.0–8.5)

## 2020-03-05 MED ORDER — HYDROCHLOROTHIAZIDE 12.5 MG PO CAPS
ORAL_CAPSULE | ORAL | 2 refills | Status: DC
Start: 2020-03-05 — End: 2020-05-19

## 2020-03-05 NOTE — Progress Notes (Signed)
Rutherford Nail as a scribe for Maximino Greenland, MD.,have documented all relevant documentation on the behalf of Maximino Greenland, MD,as directed by  Maximino Greenland, MD while in the presence of Maximino Greenland, MD. This visit occurred during the SARS-CoV-2 public health emergency.  Safety protocols were in place, including screening questions prior to the visit, additional usage of staff PPE, and extensive cleaning of exam room while observing appropriate contact time as indicated for disinfecting solutions.  Subjective:     Patient ID: Eugene Murray , male    DOB: 11-26-1940 , 80 y.o.   MRN: [id number]   Chief Complaint  Patient presents with  . Hypertension    HPI  He presents today for BP check. He reports compliance with meds. Denies headaches, chest pain and shortness of breath.   Hypertension This is a chronic problem. The current episode started more than 1 year ago. The problem has been gradually improving since onset. The problem is controlled. Associated symptoms include chest pain. Pertinent negatives include no headaches. Risk factors for coronary artery disease include dyslipidemia and male gender. The current treatment provides moderate improvement. Hypertensive end-organ damage includes CAD/MI.     Past Medical History:  Diagnosis Date  . Allergic rhinitis   . Cancer [facility])    Prostate cancer  . Coronary artery disease   . History of prostate cancer   . Hyperlipidemia   . Hypertension   . Hypokalemia   . Nephritis    as a child  . OSA (obstructive sleep apnea)    cpap machine at home  . Stroke [facility])    "mini stroke" TIA     Family History  Problem Relation Age of Onset  . Colon cancer Father        possibly age 21 y.o diagnosed   . Stroke Mother   . Asthma Sister      Current Outpatient Medications:  .  amLODipine (NORVASC) 10 MG tablet, TAKE 1 TABLET(10 MG) BY MOUTH DAILY, Disp: 30 tablet, Rfl: 0 .  Cholecalciferol (VITAMIN D) 2000 units CAPS,  Take 2,000 Units by mouth daily., Disp: , Rfl:  .  EPINEPHrine (EPIPEN 2-PAK) 0.3 mg/0.3 mL IJ SOAJ injection, Inject 0.3 mLs (0.3 mg total) into the muscle as needed for anaphylaxis., Disp: 2 each, Rfl: 1 .  lisinopril (ZESTRIL) 40 MG tablet, TAKE 1 TABLET BY MOUTH DAILY, Disp: 90 tablet, Rfl: 3 .  magnesium oxide (MAG-OX) 400 MG tablet, Take 400 mg by mouth daily., Disp: , Rfl:  .  metoprolol tartrate (LOPRESSOR) 50 MG tablet, TAKE 1 TABLET(50 MG) BY MOUTH TWICE DAILY, Disp: 60 tablet, Rfl: 6 .  NITROSTAT 0.4 MG SL tablet, PLACE 1 TABLET UNDER THE TONGUE EVERY 4 MINUTES AS NEEDED FOR CHEST PAIN. PLEASE MAKE APPOINTMENT, Disp: 25 tablet, Rfl: 0 .  atorvastatin (LIPITOR) 40 MG tablet, TAKE 1 TABLET(40 MG) BY MOUTH DAILY, Disp: 15 tablet, Rfl: 0 .  clopidogrel (PLAVIX) 75 MG tablet, TAKE 1 TABLET(75 MG) BY MOUTH DAILY, Disp: 30 tablet, Rfl: 1 .  cyclobenzaprine (FLEXERIL) 5 MG tablet, One tab po qpm prn (Patient not taking: No sig reported), Disp: 20 tablet, Rfl: 0 .  hydrochlorothiazide (MICROZIDE) 12.5 MG capsule, TAKE 1 CAPSULE(12.5 MG) BY MOUTH DAILY, Disp: 90 capsule, Rfl: 2   Allergies  Allergen Reactions  . Other Hives    SEAFOOD  . Penicillins Other (See Comments)    "caused me to pass out"  . Sulfa Antibiotics Hives  Review of Systems  Constitutional: Negative.  Negative for fatigue.  HENT: Negative.   Cardiovascular: Positive for chest pain.       He admits he is not exercising as much as he has in the past, prior to [place]. Does admit to occ. Chest pain when walking up a hill or taking a brisk walk. He has not seen his cardiologist in a few years. He does notice that his sx do not occur when using rowing machine. No associated palpitations or diaphoresis. He is sometimes winded w/ these episodes. No cp at rest.   Endocrine: Negative for polydipsia, polyphagia and polyuria.  Genitourinary: Positive for urgency.  Musculoskeletal: Negative.        He c/o b/l foot pain. Usually  when he first gets out of bed. Denies fall/trauma. Unsure of other triggers. Pain is in heel. Denies h/o heel spurs. Once he walks around, sx improve.   Skin: Negative.   Neurological: Negative for dizziness and headaches.  Psychiatric/Behavioral: Negative.      Today's Vitals   03/05/20 1001  BP: 122/68  Pulse: (!) 55  Temp: 98.5 F (36.9 C)  TempSrc: Oral  Weight: 209 lb (94.8 kg)  Height: '5\' 10"'  (1.778 m)   Body mass index is 29.99 kg/m.  Wt Readings from Last 3 Encounters:  03/05/20 209 lb (94.8 kg)  08/30/19 208 lb (94.3 kg)  08/30/19 208 lb 9.6 oz (94.6 kg)   Objective:  Physical Exam Vitals and nursing note reviewed.  Constitutional:      Appearance: Normal appearance.  Cardiovascular:     Rate and Rhythm: Normal rate and regular rhythm.     Heart sounds: Normal heart sounds.  Pulmonary:     Effort: Pulmonary effort is normal.     Breath sounds: Normal breath sounds.  Skin:    General: Skin is warm.  Neurological:     General: No focal deficit present.     Mental Status: He is alert.  Psychiatric:        Mood and Affect: Mood normal.         Assessment And Plan:     1. Hypertensive heart disease without heart failure Comments: Chronic, well controlled.  He is encouraged to follow low sodium diet. I will check renal function today.  - CMP14+EGFR  2. Atherosclerosis of native coronary artery of native heart with other form of angina pectoris ([place]) Comments: Chronic.  Advised to follow heart healthy lifestyle. Current sx possibly due to deconditioning. It has been more than a year since last Cardiology evaluation.  I will refer him to Dr. Claiborne Billings for further evaluation.  - Ambulatory referral to Cardiology  3. Foot pain, bilateral Comments: Symptoms are suggestive of plantar fasciitis. He was given stretching exercises to perform upon awakening and while watching TV. He will let me know if sx persist.  4. Urinary urgency Comments: HE is s/p prostatectomy  in 1997. I will check u/a today to r/o UTI.  - POCT Urinalysis Dipstick (81002)  5. Overweight with body mass index (BMI) of 29 to 29.9 in adult Comments: His weight is stable for his demographic. Advised to aim for at least 150 minutes of exercise per week.   6. Need for influenza vaccination Comments: He was given high dose flu vaccine.  - Flu Vaccine QUAD High Dose(Fluad)   Patient was given opportunity to ask questions. Patient verbalized understanding of the plan and was able to repeat key elements of the plan. All questions were answered  to their satisfaction.  Maximino Greenland, MD   I, Maximino Greenland, MD, have reviewed all documentation for this visit. The documentation on 03/13/20 for the exam, diagnosis, procedures, and orders are all accurate and complete.  THE PATIENT IS ENCOURAGED TO PRACTICE SOCIAL DISTANCING DUE TO THE COVID-19 PANDEMIC.

## 2020-03-05 NOTE — Patient Instructions (Addendum)
Plantar Fasciitis  Plantar fasciitis is a painful foot condition that affects the heel. It occurs when the band of tissue that connects the toes to the heel bone (plantar fascia) becomes irritated. This can happen as the result of exercising too much or doing other repetitive activities (overuse injury). The pain from plantar fasciitis can range from mild irritation to severe pain that makes it difficult to walk or move. The pain is usually worse in the morning after sleeping, or after sitting or lying down for a while. Pain may also be worse after long periods of walking or standing. What are the causes? This condition may be caused by:  Standing for long periods of time.  Wearing shoes that do not have good arch support.  Doing activities that put stress on joints (high-impact activities), including running, aerobics, and ballet.  Being overweight.  An abnormal way of walking (gait).  Tight muscles in the back of your lower leg (calf).  High arches in your feet.  Starting a new athletic activity. What are the signs or symptoms? The main symptom of this condition is heel pain. Pain may:  Be worse with first steps after a time of rest, especially in the morning after sleeping or after you have been sitting or lying down for a while.  Be worse after long periods of standing still.  Decrease after 30-45 minutes of activity, such as gentle walking. How is this diagnosed? This condition may be diagnosed based on your medical history and your symptoms. Your health care provider may ask questions about your activity level. Your health care provider will do a physical exam to check for:  A tender area on the bottom of your foot.  A high arch in your foot.  Pain when you move your foot.  Difficulty moving your foot. You may have imaging tests to confirm the diagnosis, such as:  X-rays.  Ultrasound.  MRI. How is this treated? Treatment for plantar fasciitis depends on how  severe your condition is. Treatment may include:  Rest, ice, applying pressure (compression), and raising the affected foot (elevation). This may be called RICE therapy. Your health care provider may recommend RICE therapy along with over-the-counter pain medicines to manage your pain.  Exercises to stretch your calves and your plantar fascia.  A splint that holds your foot in a stretched, upward position while you sleep (night splint).  Physical therapy to relieve symptoms and prevent problems in the future.  Injections of steroid medicine (cortisone) to relieve pain and inflammation.  Stimulating your plantar fascia with electrical impulses (extracorporeal shock wave therapy). This is usually the last treatment option before surgery.  Surgery, if other treatments have not worked after 12 months. Follow these instructions at home:  Managing pain, stiffness, and swelling  If directed, put ice on the painful area: ? Put ice in a plastic bag, or use a frozen bottle of water. ? Place a towel between your skin and the bag or bottle. ? Roll the bottom of your foot over the bag or bottle. ? Do this for 20 minutes, 2-3 times a day.  Wear athletic shoes that have air-sole or gel-sole cushions, or try wearing soft shoe inserts that are designed for plantar fasciitis.  Raise (elevate) your foot above the level of your heart while you are sitting or lying down. Activity  Avoid activities that cause pain. Ask your health care provider what activities are safe for you.  Do physical therapy exercises and stretches as told   by your health care provider.  Try activities and forms of exercise that are easier on your joints (low-impact). Examples include swimming, water aerobics, and biking. General instructions  Take over-the-counter and prescription medicines only as told by your health care provider.  Wear a night splint while sleeping, if told by your health care provider. Loosen the splint  if your toes tingle, become numb, or turn cold and blue.  Maintain a healthy weight, or work with your health care provider to lose weight as needed.  Keep all follow-up visits as told by your health care provider. This is important. Contact a health care provider if you:  Have symptoms that do not go away after caring for yourself at home.  Have pain that gets worse.  Have pain that affects your ability to move or do your daily activities. Summary  Plantar fasciitis is a painful foot condition that affects the heel. It occurs when the band of tissue that connects the toes to the heel bone (plantar fascia) becomes irritated.  The main symptom of this condition is heel pain that may be worse after exercising too much or standing still for a long time.  Treatment varies, but it usually starts with rest, ice, compression, and elevation (RICE therapy) and over-the-counter medicines to manage pain. This information is not intended to replace advice given to you by your health care provider. Make sure you discuss any questions you have with your health care provider. Document Revised: 01/28/2017 Document Reviewed: 12/13/2016 Elsevier Patient Education  2020 Elsevier Inc.  

## 2020-03-06 ENCOUNTER — Other Ambulatory Visit: Payer: Self-pay | Admitting: Cardiovascular Disease

## 2020-03-06 ENCOUNTER — Other Ambulatory Visit: Payer: Self-pay | Admitting: General Practice

## 2020-03-07 ENCOUNTER — Other Ambulatory Visit: Payer: Self-pay | Admitting: Cardiovascular Disease

## 2020-03-18 ENCOUNTER — Other Ambulatory Visit: Payer: Self-pay | Admitting: General Practice

## 2020-03-18 ENCOUNTER — Other Ambulatory Visit: Payer: Self-pay | Admitting: Cardiovascular Disease

## 2020-03-24 ENCOUNTER — Ambulatory Visit: Payer: Medicare Other | Admitting: Physician Assistant

## 2020-03-24 ENCOUNTER — Telehealth: Payer: Medicare Other

## 2020-03-25 ENCOUNTER — Encounter: Payer: Self-pay | Admitting: Cardiology

## 2020-03-25 ENCOUNTER — Encounter: Payer: Self-pay | Admitting: Cardiovascular Disease

## 2020-03-25 ENCOUNTER — Other Ambulatory Visit: Payer: Self-pay

## 2020-03-25 ENCOUNTER — Ambulatory Visit (INDEPENDENT_AMBULATORY_CARE_PROVIDER_SITE_OTHER): Payer: Medicare Other | Admitting: Cardiology

## 2020-03-25 VITALS — BP 140/86 | HR 54 | Ht 70.0 in | Wt 211.6 lb

## 2020-03-25 DIAGNOSIS — Z20822 Contact with and (suspected) exposure to covid-19: Secondary | ICD-10-CM | POA: Diagnosis not present

## 2020-03-25 DIAGNOSIS — Z8546 Personal history of malignant neoplasm of prostate: Secondary | ICD-10-CM | POA: Diagnosis not present

## 2020-03-25 DIAGNOSIS — I639 Cerebral infarction, unspecified: Secondary | ICD-10-CM

## 2020-03-25 DIAGNOSIS — G4733 Obstructive sleep apnea (adult) (pediatric): Secondary | ICD-10-CM

## 2020-03-25 DIAGNOSIS — Z9861 Coronary angioplasty status: Secondary | ICD-10-CM

## 2020-03-25 DIAGNOSIS — R079 Chest pain, unspecified: Secondary | ICD-10-CM | POA: Diagnosis not present

## 2020-03-25 DIAGNOSIS — R072 Precordial pain: Secondary | ICD-10-CM

## 2020-03-25 DIAGNOSIS — I10 Essential (primary) hypertension: Secondary | ICD-10-CM

## 2020-03-25 DIAGNOSIS — Z8673 Personal history of transient ischemic attack (TIA), and cerebral infarction without residual deficits: Secondary | ICD-10-CM | POA: Diagnosis not present

## 2020-03-25 DIAGNOSIS — I2510 Atherosclerotic heart disease of native coronary artery without angina pectoris: Secondary | ICD-10-CM

## 2020-03-25 DIAGNOSIS — R0989 Other specified symptoms and signs involving the circulatory and respiratory systems: Secondary | ICD-10-CM | POA: Diagnosis not present

## 2020-03-25 DIAGNOSIS — E785 Hyperlipidemia, unspecified: Secondary | ICD-10-CM

## 2020-03-25 MED ORDER — NITROGLYCERIN 0.4 MG SL SUBL: 0.4000 mg | SUBLINGUAL_TABLET | SUBLINGUAL | 2 refills | Status: DC | PRN

## 2020-03-25 MED ORDER — ATORVASTATIN CALCIUM 40 MG PO TABS: 40.0000 mg | ORAL_TABLET | Freq: Every day | ORAL | 3 refills | Status: DC

## 2020-03-25 MED ORDER — CLOPIDOGREL BISULFATE 75 MG PO TABS: 75.0000 mg | ORAL_TABLET | Freq: Every day | ORAL | 3 refills | Status: DC

## 2020-03-25 MED ORDER — AMLODIPINE BESYLATE 10 MG PO TABS: 10.0000 mg | ORAL_TABLET | Freq: Every day | ORAL | 3 refills | Status: DC

## 2020-03-25 NOTE — Assessment & Plan Note (Signed)
LDL 66 July 2021

## 2020-03-25 NOTE — Progress Notes (Signed)
Cardiology Office Note:    Date:  03/25/2020   ID:  Eugene Murray, DOB 1940-04-19, MRN [id number]  PCP:  Eugene Chard, MD  Cardiologist:  Eugene Majestic, MD  Electrophysiologist:  None   Referring MD: Eugene Chard, MD   CC: exertional chest tightness  History of Present Illness:    Eugene Murray is a 80 y.o. male with a hx of coronary artery disease. In 2008 he had a diagonal PCI with DES and then RCA PCI with DES. Catheterization in 2011 for chest pain showed the previously placed stents to be patent. He did have a new lesion in the RCA distal to the previously placed stent. The site was intervened on with PCI and DES. Nuclear stress test in 2013 was low risk. Other medical issues include hypertension, dyslipidemia, history of possible small stroke in 2014 by MRI vs atypical migraine, OSA on C-pap, and a history of prostate cancer status post radical prostatectomy in the past.  The last time he was seen by cardiology was in November 2020.  He is seen in the office today with complaints of exertional chest tightness.  The patient exercises by walking at the [facility].  He goes a couple times a week and walks a mile or so.  He is noted occasionally that he gets chest tightness that causes him to stop and sit down and rest.  His symptoms resolve and that he can continue walking.  This is new for him, he is noted it over the last couple months.  He has not had rest pain or had to use nitroglycerin.  Past Medical History:  Diagnosis Date  . Allergic rhinitis   . Cancer [facility])    Prostate cancer  . Coronary artery disease   . History of prostate cancer   . Hyperlipidemia   . Hypertension   . Hypokalemia   . Nephritis    as a child  . OSA (obstructive sleep apnea)    cpap machine at home  . Stroke [facility])    "mini stroke" TIA    Past Surgical History:  Procedure Laterality Date  . CARDIAC CATHETERIZATION    . CAROTID STENT  11-08 and 4-11   stent x 3 (12/2006 x 2; 2011 x 1 stent)  .  CATARACT EXTRACTION W/PHACO Left 08/21/2014   Procedure: PHACO EMULSION CATARACT EXTRACTION AND INTRAOCULAR LENS PLACEMENT ([place]) IMPLANT LEFT EYE;  Surgeon: Marylynn Pearson, MD;  Location: [facility];  Service: Ophthalmology;  Laterality: Left;  . COLONOSCOPY  2015  . COLONOSCOPY W/ POLYPECTOMY    . CORONARY ANGIOPLASTY    . HERNIA REPAIR Bilateral    x 2   different times  . PROSTATE SURGERY     had cancer; radical resection 1997     Current Medications: Current Meds  Medication Sig  . Cholecalciferol (VITAMIN D) 2000 units CAPS Take 2,000 Units by mouth daily.  Marland Kitchen EPINEPHrine (EPIPEN 2-PAK) 0.3 mg/0.3 mL IJ SOAJ injection Inject 0.3 mLs (0.3 mg total) into the muscle as needed for anaphylaxis.  . hydrochlorothiazide (MICROZIDE) 12.5 MG capsule TAKE 1 CAPSULE(12.5 MG) BY MOUTH DAILY  . lisinopril (ZESTRIL) 40 MG tablet TAKE 1 TABLET BY MOUTH DAILY  . magnesium oxide (MAG-OX) 400 MG tablet Take 400 mg by mouth daily.  . metoprolol tartrate (LOPRESSOR) 50 MG tablet TAKE 1 TABLET(50 MG) BY MOUTH TWICE DAILY  . [DISCONTINUED] amLODipine (NORVASC) 10 MG tablet TAKE 1 TABLET(10 MG) BY MOUTH DAILY  . [DISCONTINUED] atorvastatin (LIPITOR) 40 MG tablet  TAKE 1 TABLET(40 MG) BY MOUTH DAILY  . [DISCONTINUED] clopidogrel (PLAVIX) 75 MG tablet TAKE 1 TABLET(75 MG) BY MOUTH DAILY  . [DISCONTINUED] NITROSTAT 0.4 MG SL tablet PLACE 1 TABLET UNDER THE TONGUE EVERY 4 MINUTES AS NEEDED FOR CHEST PAIN. PLEASE MAKE APPOINTMENT     Allergies:   Other, Penicillins, and Sulfa antibiotics   Social History   Socioeconomic History  . Marital status: Married    Spouse name: Eugene Murray  . Number of children: 3  . Years of education: College  . Highest education level: Not on file  Occupational History  . Occupation: retired  Tobacco Use  . Smoking status: Former Smoker    Years: 15.00    Types: Cigarettes    Quit date: 09/08/1985    Years since quitting: 34.5  . Smokeless tobacco: Never Used  Vaping Use  . Vaping  Use: Never used  Substance and Sexual Activity  . Alcohol use: Not Currently    Comment: seldom  . Drug use: No  . Sexual activity: Yes  Other Topics Concern  . Not on file  Social History Narrative   3 kids    Retired Arboriculturist    Former smoker quit 1980s. Denies etOH, other drugs    Patient lives at home with family. Patient lives at home with his wife Eugene Murray)   Caffeine Use: 1 glass of tea every other day   Social Determinants of Health   Financial Resource Strain: Low Risk   . Difficulty of Paying Living Expenses: Not hard at all  Food Insecurity: No Food Insecurity  . Worried About Charity fundraiser in the Last Year: Never true  . Ran Out of Food in the Last Year: Never true  Transportation Needs: No Transportation Needs  . Lack of Transportation (Medical): No  . Lack of Transportation (Non-Medical): No  Physical Activity: Insufficiently Active  . Days of Exercise per Week: 3 days  . Minutes of Exercise per Session: 20 min  Stress: No Stress Concern Present  . Feeling of Stress : Not at all  Social Connections: Not on file     Family History: The patient's family history includes Asthma in his sister; Colon cancer in his father; Stroke in his mother.  ROS:   Please see the history of present illness.     All other systems reviewed and are negative.  EKGs/Labs/Other Studies Reviewed:    The following studies were reviewed today: Myoview June 2013  EKG:  EKG is ordered today.  The ekg ordered today demonstrates NSR, HR 54  Recent Labs: 03/05/2020: ALT 14; BUN 12; Creatinine, Ser 1.20; Potassium 4.2; Sodium 142  Recent Lipid Panel    Component Value Date/Time   CHOL 121 08/30/2019 1122   TRIG 65 08/30/2019 1122   HDL 41 08/30/2019 1122   CHOLHDL 3.0 08/30/2019 1122   CHOLHDL 3.1 08/05/2015 1050   VLDL 14 08/05/2015 1050   LDLCALC 66 08/30/2019 1122    Physical Exam:    VS:  BP 140/86   Pulse (!) 54   Ht 5\' 10"  (1.778 m)   Wt 211 lb 9.6 oz (96 kg)    BMI 30.36 kg/m     Wt Readings from Last 3 Encounters:  03/25/20 211 lb 9.6 oz (96 kg)  03/05/20 209 lb (94.8 kg)  08/30/19 208 lb (94.3 kg)     GEN: Well nourished, well developed male in no acute distress HEENT: Normal NECK: No JVD; RCA bruit LYMPHATICS: No lymphadenopathy CARDIAC:  RRR, no murmurs, rubs, gallops RESPIRATORY:  Clear to auscultation without rales, wheezing or rhonchi  ABDOMEN: Soft, non-distended MUSCULOSKELETAL:  No edema; No deformity  SKIN: Warm and dry NEUROLOGIC:  Alert and oriented x 3 PSYCHIATRIC:  Normal affect   ASSESSMENT:    Chest pain with moderate risk for cardiac etiology Pt c/o exertional chest tightness x 3 months  CAD S/P percutaneous coronary angioplasty Dx and RCA PCI with DES 2008 RCA PCI with DES 2011-new site after previously placed stent which was patent.  Previously placed Dx stent patent as well. Low risk Nuc stress 2013  Essential hypertension controlled  Dyslipidemia LDL 66 July 2021  History of CVA (cerebrovascular accident) Questionable tiny acute infarct left pontomedullary junction MRI 06/30/12   History of prostate cancer Hx radical prostatectomy 1997  Right carotid bruit .  PLAN:    Exercise Myoview.  He can hold his metoprolol the night before and the day of the test.  I also be set up for carotid artery Dopplers for right carotid artery bruit.  I explained that if his Myoview was low risk or only showed a small area of ischemia we may attempt medical therapy first.  If it appears there is a larger area at risk he may need diagnostic catheterization.   Medication Adjustments/Labs and Tests Ordered: Current medicines are reviewed at length with the patient today.  Concerns regarding medicines are outlined above.  Orders Placed This Encounter  Procedures  . MYOCARDIAL PERFUSION IMAGING  . EKG 12-Lead  . VAS US CAROTID   Meds ordered this encounter  Medications  . amLODipine (NORVASC) 10 MG tablet    Sig: Take  1 tablet (10 mg total) by mouth daily.    Dispense:  90 tablet    Refill:  3    **Patient requests 90 days supply**  . atorvastatin (LIPITOR) 40 MG tablet    Sig: Take 1 tablet (40 mg total) by mouth daily.    Dispense:  90 tablet    Refill:  3    **Patient requests 90 days supply**  . clopidogrel (PLAVIX) 75 MG tablet    Sig: Take 1 tablet (75 mg total) by mouth daily.    Dispense:  90 tablet    Refill:  3    **Patient requests 90 days supply**  PLEASE SCHEDULE AN APPT FOR FUTURE REFILLS  . nitroGLYCERIN (NITROSTAT) 0.4 MG SL tablet    Sig: Place 1 tablet (0.4 mg total) under the tongue every 5 (five) minutes as needed for chest pain.    Dispense:  25 tablet    Refill:  2    Please call and schedule an appointment    Patient Instructions  Medication Instructions:  Continue current medications  *If you need a refill on your cardiac medications before your next appointment, please call your pharmacy*   Lab Work: None Ordered   Testing/Procedures: Your physician has requested that you have a carotid duplex. This test is an ultrasound of the carotid arteries in your neck. It looks at blood flow through these arteries that supply the brain with blood. Allow one hour for this exam. There are no restrictions or special instructions.  Your physician has requested that you have en exercise stress myoview. For further information please visit [email or web address]. Please follow instruction sheet, as given.  Follow-Up: At [facility], you and your health needs are our priority.  As part of our continuing mission to provide you with exceptional heart care, we have created designated Provider  Care Teams.  These Care Teams include your primary Cardiologist (physician) and Advanced Practice Providers (APPs -  Physician Assistants and Nurse Practitioners) who all work together to provide you with the care you need, when you need it.  We recommend signing up for the patient portal called  "MyChart".  Sign up information is provided on this After Visit Summary.  MyChart is used to connect with patients for Virtual Visits (Telemedicine).  Patients are able to view lab/test results, encounter notes, upcoming appointments, etc.  Non-urgent messages can be sent to your provider as well.   To learn more about what you can do with MyChart, go to [email or web address].    Your next appointment:   Keep follow-up appointment with Dr Claiborne Billings  The format for your next appointment:   In Person  Provider:   Shelva Majestic, MD       Signed, Kerin Ransom, PA-C  03/25/2020 3:17 PM    [place]

## 2020-03-25 NOTE — Assessment & Plan Note (Signed)
Questionable tiny acute infarct left pontomedullary junction MRI 06/30/12

## 2020-03-25 NOTE — Assessment & Plan Note (Signed)
Dx and RCA PCI with DES 2008 RCA PCI with DES 2011-new site after previously placed stent which was patent.  Previously placed Dx stent patent as well. Low risk Nuc stress 2013

## 2020-03-25 NOTE — Assessment & Plan Note (Signed)
Pt c/o exertional chest tightness x 3 months

## 2020-03-25 NOTE — Assessment & Plan Note (Signed)
controlled 

## 2020-03-25 NOTE — Assessment & Plan Note (Signed)
Hx radical prostatectomy 1997

## 2020-03-25 NOTE — Patient Instructions (Signed)
Medication Instructions:  Continue current medications  *If you need a refill on your cardiac medications before your next appointment, please call your pharmacy*   Lab Work: None Ordered   Testing/Procedures: Your physician has requested that you have a carotid duplex. This test is an ultrasound of the carotid arteries in your neck. It looks at blood flow through these arteries that supply the brain with blood. Allow one hour for this exam. There are no restrictions or special instructions.  Your physician has requested that you have en exercise stress myoview. For further information please visit [email or web address]. Please follow instruction sheet, as given.  Follow-Up: At [facility], you and your health needs are our priority.  As part of our continuing mission to provide you with exceptional heart care, we have created designated Provider Care Teams.  These Care Teams include your primary Cardiologist (physician) and Advanced Practice Providers (APPs -  Physician Assistants and Nurse Practitioners) who all work together to provide you with the care you need, when you need it.  We recommend signing up for the patient portal called "MyChart".  Sign up information is provided on this After Visit Summary.  MyChart is used to connect with patients for Virtual Visits (Telemedicine).  Patients are able to view lab/test results, encounter notes, upcoming appointments, etc.  Non-urgent messages can be sent to your provider as well.   To learn more about what you can do with MyChart, go to [email or web address].    Your next appointment:   Keep follow-up appointment with Dr Claiborne Billings  The format for your next appointment:   In Person  Provider:   Shelva Majestic, MD

## 2020-04-07 ENCOUNTER — Other Ambulatory Visit (HOSPITAL_COMMUNITY)
Admission: RE | Admit: 2020-04-07 | Discharge: 2020-04-07 | Disposition: A | Discharge status: Home / Self Care | Payer: Medicare Other | Source: Ambulatory Visit | Attending: Cardiovascular Disease | Admitting: Cardiovascular Disease

## 2020-04-07 DIAGNOSIS — Z01812 Encounter for preprocedural laboratory examination: Secondary | ICD-10-CM | POA: Diagnosis not present

## 2020-04-07 DIAGNOSIS — Z20822 Contact with and (suspected) exposure to covid-19: Secondary | ICD-10-CM | POA: Diagnosis not present

## 2020-04-08 LAB — SARS CORONAVIRUS 2 (TAT 6-24 HRS): SARS Coronavirus 2: NEGATIVE

## 2020-04-09 ENCOUNTER — Telehealth (HOSPITAL_COMMUNITY): Payer: Self-pay | Admitting: *Deleted

## 2020-04-09 NOTE — Telephone Encounter (Signed)
Close encounter 

## 2020-04-10 ENCOUNTER — Other Ambulatory Visit: Payer: Self-pay

## 2020-04-10 ENCOUNTER — Ambulatory Visit (HOSPITAL_BASED_OUTPATIENT_CLINIC_OR_DEPARTMENT_OTHER)
Admission: RE | Admit: 2020-04-10 | Discharge: 2020-04-10 | Disposition: A | Discharge status: Home / Self Care | Payer: Medicare Other | Source: Ambulatory Visit | Attending: Cardiology | Admitting: Cardiology

## 2020-04-10 ENCOUNTER — Ambulatory Visit (HOSPITAL_COMMUNITY)
Admission: RE | Admit: 2020-04-10 | Discharge: 2020-04-10 | Disposition: A | Discharge status: Home / Self Care | Payer: Medicare Other | Source: Ambulatory Visit | Attending: Cardiovascular Disease | Admitting: Cardiovascular Disease

## 2020-04-10 DIAGNOSIS — E785 Hyperlipidemia, unspecified: Secondary | ICD-10-CM | POA: Diagnosis not present

## 2020-04-10 DIAGNOSIS — Z87891 Personal history of nicotine dependence: Secondary | ICD-10-CM | POA: Diagnosis not present

## 2020-04-10 DIAGNOSIS — I10 Essential (primary) hypertension: Secondary | ICD-10-CM | POA: Diagnosis not present

## 2020-04-10 DIAGNOSIS — I2510 Atherosclerotic heart disease of native coronary artery without angina pectoris: Secondary | ICD-10-CM | POA: Diagnosis not present

## 2020-04-10 DIAGNOSIS — R079 Chest pain, unspecified: Secondary | ICD-10-CM | POA: Insufficient documentation

## 2020-04-10 DIAGNOSIS — Z8673 Personal history of transient ischemic attack (TIA), and cerebral infarction without residual deficits: Secondary | ICD-10-CM | POA: Diagnosis not present

## 2020-04-10 DIAGNOSIS — Z8249 Family history of ischemic heart disease and other diseases of the circulatory system: Secondary | ICD-10-CM | POA: Insufficient documentation

## 2020-04-10 DIAGNOSIS — R0989 Other specified symptoms and signs involving the circulatory and respiratory systems: Secondary | ICD-10-CM

## 2020-04-10 LAB — MYOCARDIAL PERFUSION IMAGING
LV dias vol: 162 mL (ref 62–150)
LV sys vol: 73 mL
Peak HR: 91 {beats}/min
Rest HR: 60 {beats}/min
SDS: 0
SRS: 3
SSS: 3
TID: 1.07

## 2020-04-10 MED ORDER — TECHNETIUM TC 99M TETROFOSMIN IV KIT
10.9000 | PACK | Freq: Once | INTRAVENOUS | Status: AC | PRN
  Administered 2020-04-10: 10.9 via INTRAVENOUS
  Filled 2020-04-10: qty 11

## 2020-04-10 MED ORDER — TECHNETIUM TC 99M TETROFOSMIN IV KIT
31.5000 | PACK | Freq: Once | INTRAVENOUS | Status: AC | PRN
  Administered 2020-04-10: 31.5 via INTRAVENOUS
  Filled 2020-04-10: qty 32

## 2020-04-10 MED ORDER — AMINOPHYLLINE 25 MG/ML IV SOLN
75.0000 mg | Freq: Once | INTRAVENOUS | Status: AC
  Administered 2020-04-10: 75 mg via INTRAVENOUS

## 2020-04-10 MED ORDER — REGADENOSON 0.4 MG/5ML IV SOLN
0.4000 mg | Freq: Once | INTRAVENOUS | Status: AC
  Administered 2020-04-10: 0.4 mg via INTRAVENOUS

## 2020-04-21 ENCOUNTER — Other Ambulatory Visit: Payer: Self-pay | Admitting: Cardiovascular Disease

## 2020-04-28 ENCOUNTER — Telehealth: Payer: Medicare Other

## 2020-04-28 ENCOUNTER — Ambulatory Visit (INDEPENDENT_AMBULATORY_CARE_PROVIDER_SITE_OTHER): Payer: Medicare Other

## 2020-04-28 DIAGNOSIS — E7800 Pure hypercholesterolemia, unspecified: Secondary | ICD-10-CM

## 2020-04-28 DIAGNOSIS — I25118 Atherosclerotic heart disease of native coronary artery with other forms of angina pectoris: Secondary | ICD-10-CM | POA: Diagnosis not present

## 2020-04-28 DIAGNOSIS — I119 Hypertensive heart disease without heart failure: Secondary | ICD-10-CM

## 2020-04-28 DIAGNOSIS — I10 Essential (primary) hypertension: Secondary | ICD-10-CM | POA: Diagnosis not present

## 2020-04-29 NOTE — Patient Instructions (Signed)
Goals Addressed    Other   .  Improve My Heart Health-Coronary Artery Disease   On track     Timeframe:  Long-Range Goal Priority:  High Start Date:  04/28/20                          Expected End Date: 10/23/20                    Follow Up Date:  Patient Self Care Activities:  - Self administer medications as prescribed - Attend all scheduled provider appointments - Call pharmacy for medication refills - Call provider office for new concerns or questions - Replace BP cuff and continue to monitor BP at home as discussed   - if I have chest pain, call for help    Why is this important?    Lifestyle changes are key to improving the blood flow to your heart. Think about the things you can change and set a goal to live healthy.   Remember, when the blood vessels to your heart start to get clogged you may not have any symptoms.   Over time, they can get worse.   Don't ignore the signs, like chest pain, and get help right away.     Notes:

## 2020-04-29 NOTE — Chronic Care Management (AMB) (Signed)
Chronic Care Management   CCM RN Visit Note  04/28/2020 Name: Eugene Murray MRN: [id number] DOB: 05/29/1940  Subjective: Eugene Murray is a 80 y.o. year old male who is a primary care patient of [name], MD. The care management team was consulted for assistance with disease management and care coordination needs.    Engaged with patient by telephone for follow up visit in response to provider referral for case management and/or care coordination services.   Consent to Services:  The patient was given information about Chronic Care Management services, agreed to services, and gave verbal consent prior to initiation of services.  Please see initial visit note for detailed documentation.   Patient agreed to services and verbal consent obtained.   Assessment: Review of patient past medical history, allergies, medications, health status, including review of consultants reports, laboratory and other test data, was performed as part of comprehensive evaluation and provision of chronic care management services.   SDOH (Social Determinants of Health) assessments and interventions performed:  Yes, no needs identified  CCM Care Plan  Allergies  Allergen Reactions  . Other Hives    SEAFOOD  . Penicillins Other (See Comments)    "caused me to pass out"  . Sulfa Antibiotics Hives    Outpatient Encounter Medications as of 04/28/2020  Medication Sig  . atorvastatin (LIPITOR) 40 MG tablet TAKE 1 TABLET(40 MG) BY MOUTH DAILY  . amLODipine (NORVASC) 10 MG tablet Take 1 tablet (10 mg total) by mouth daily.  . Cholecalciferol (VITAMIN D) 2000 units CAPS Take 2,000 Units by mouth daily.  . clopidogrel (PLAVIX) 75 MG tablet Take 1 tablet (75 mg total) by mouth daily.  . cyclobenzaprine (FLEXERIL) 5 MG tablet One tab po qpm prn (Patient not taking: Reported on 03/25/2020)  . EPINEPHrine (EPIPEN 2-PAK) 0.3 mg/0.3 mL IJ SOAJ injection Inject 0.3 mLs (0.3 mg total) into the muscle as needed for  anaphylaxis.  . hydrochlorothiazide (MICROZIDE) 12.5 MG capsule TAKE 1 CAPSULE(12.5 MG) BY MOUTH DAILY  . lisinopril (ZESTRIL) 40 MG tablet TAKE 1 TABLET BY MOUTH DAILY  . magnesium oxide (MAG-OX) 400 MG tablet Take 400 mg by mouth daily.  . metoprolol tartrate (LOPRESSOR) 50 MG tablet TAKE 1 TABLET(50 MG) BY MOUTH TWICE DAILY  . nitroGLYCERIN (NITROSTAT) 0.4 MG SL tablet Place 1 tablet (0.4 mg total) under the tongue every 5 (five) minutes as needed for chest pain.   No facility-administered encounter medications on file as of 04/28/2020.    Patient Active Problem List   Diagnosis Date Noted  . Chest pain with moderate risk for cardiac etiology 03/25/2020  . Right carotid bruit 03/25/2020  . History of CVA (cerebrovascular accident) 04/02/2016  . Back pain, chronic 07/26/2012  . Right leg weakness 07/26/2012  . Migraine variant 07/26/2012  . Stroke-like symptoms 07/26/2012  . Sinus bradycardia 06/30/2012  . CAD S/P percutaneous coronary angioplasty 06/30/2012  . Dyslipidemia 06/30/2012  . Hypokalemia 06/30/2012  . Obstructive sleep apnea 05/05/2007  . Essential hypertension 05/05/2007  . ALLERGIC RHINITIS 05/05/2007  . History of prostate cancer 05/05/2007    Conditions to be addressed/monitored:Essential hypertension, Pure hypercholesteremia, Atherosclerosis of native coronary artery of native heart with other form of angina pectoris, Hypertensive Heart disease without heart failure   Care Plan : Coronary Artery Disease (Adult)  Updates made by Lynne Logan, RN since 04/29/2020 12:00 AM    Problem: Disease Progression (Coronary Artery Disease)   Priority: High    Long-Range Goal: Disease Progression Prevented  or Minimized   Start Date: 04/28/2020  Expected End Date: 10/26/2020  This Visit's Progress: On track  Priority: High  Note:   Current Barriers:  Marland Kitchen Knowledge Deficits related to disease process and Self Health management of Hypertensive Heart Disease  . Chronic  Disease Management support and education needs related to Essential hypertension, Pure hypercholesteremia, Atherosclerosis of native coronary artery of native heart with other form of angina pectoris, Hypertensive Heart disease without heart failure  Nurse Case Manager Clinical Goal(s):  Marland Kitchen Over the next 180 days, patient will demonstrate a decrease in Hypertensive exacerbations as evidenced by patient will maintain BP <130/80, experience no chest pain and or other cardiac related symptoms CCM RN CM Interventions:  04/28/20 call completed with patient  . Evaluation of current treatment plan related to Hypertension and patient's adherence to plan as established by provider. . Provided education to patient re: target BP range of <130/80; Educated on dietary and exercise recommendations . Determined patient is self monitoring his BP at home, however reports broken BP cuff, he plans to replace in near future . Reviewed medications with patient and discussed indication of Hypertensive/Cardiac medications, including dosage and frequency; instructed patient on importance of taking these medications exactly as prescribed at the same time every day for best results; Current regimen:  Amlodipine (Norvasc) 10 mg tablet: Take 1 tablet (10 mg total) by mouth daily Atorvastatin (Lipitor) 40 mg tablet: Take 1 tablet (40 mg total) by mouth daily Clopidogrel (Plavix) 75 mg tablet: Take 1 tablet (75 mg total) by mouth daily Nitroglycerin (Nitrostat) 0.4 mg SL tablet: Place 1 tablet (0.4 mg total) under the tongue every 5 (five) minutes as needed for chest pain . Discussed plans with patient for ongoing care management follow up and provided patient with direct contact information for care management team Patient Self Care Activities:  . Self administer medications as prescribed . Attend all scheduled provider appointments . Call pharmacy for medication refills . Call provider office for new concerns or  questions . Replace BP cuff and continue to monitor BP at home as discussed  . If I have chest pain, call for help  Next Follow Up Call: 09/15/20    Plan:Telephone follow up appointment with care management team member scheduled for:  09/15/20  Barb Merino, RN, BSN, CCM Care Management Coordinator [facility]  Direct Phone: [phone]

## 2020-05-19 ENCOUNTER — Other Ambulatory Visit: Payer: Self-pay | Admitting: Internal Medicine

## 2020-06-18 DIAGNOSIS — H2511 Age-related nuclear cataract, right eye: Secondary | ICD-10-CM | POA: Diagnosis not present

## 2020-06-30 ENCOUNTER — Other Ambulatory Visit: Payer: Self-pay

## 2020-06-30 ENCOUNTER — Ambulatory Visit (INDEPENDENT_AMBULATORY_CARE_PROVIDER_SITE_OTHER): Payer: Medicare Other | Admitting: Cardiovascular Disease

## 2020-06-30 DIAGNOSIS — Z9861 Coronary angioplasty status: Secondary | ICD-10-CM | POA: Diagnosis not present

## 2020-06-30 DIAGNOSIS — G4733 Obstructive sleep apnea (adult) (pediatric): Secondary | ICD-10-CM

## 2020-06-30 DIAGNOSIS — E785 Hyperlipidemia, unspecified: Secondary | ICD-10-CM

## 2020-06-30 DIAGNOSIS — I2510 Atherosclerotic heart disease of native coronary artery without angina pectoris: Secondary | ICD-10-CM

## 2020-06-30 DIAGNOSIS — I771 Stricture of artery: Secondary | ICD-10-CM

## 2020-06-30 DIAGNOSIS — I10 Essential (primary) hypertension: Secondary | ICD-10-CM | POA: Diagnosis not present

## 2020-06-30 DIAGNOSIS — I209 Angina pectoris, unspecified: Secondary | ICD-10-CM

## 2020-06-30 MED ORDER — ISOSORBIDE MONONITRATE ER 30 MG PO TB24: 30.0000 mg | ORAL_TABLET | Freq: Every day | ORAL | 3 refills | Status: DC

## 2020-06-30 NOTE — Progress Notes (Signed)
Patient ID: Eugene Murray, male   DOB: May 11, 1940, 80 y.o.   MRN: [id number]      HPI: Eugene Murray  is a 80 y.o. male presents to the office today for a 35 month follow-up cardiology evaluation.   Eugene Murray  has a history of hypertension, hyperlipidemia, obstructive sleep apnea on CPAP therapy, history of prostate cancer, and CAD.  In November 2008, he underwent initial PCI to his diagonal vessel and RCA. In April 2011 he was found to have a new 95% stenosis beyond is widely patent RCA stent in the distal RCA at which time a 3.5x12 mm Promus DES stent was inserted. His diagonal stent was patent. He had 20% mid LAD stenosis. In June 2013 a perfusion study was normal. In 2014 he experienced symptoms of weakness involving his right arm with slowness of speech and dysarthria. He was seen by the stroke team. A CT of his head did not show acute intracranial abnormality. MRI showed an equivocal tiny acute left pontomedullary junction stroke. He had mild atherosclerotic changes but otherwise unremarkable. He had seen a neurologist and was started on topiramate. He has been stable neurologically.  I saw him in 2016 and over the prior year he remained stable from a cardiovascular standpoint without recurrent chest pain symptomatology.  He goes to the gym several days per week.  He denies any significant episodes of recurrent chest pain.  There have been some short fleeting episodes.  He denies shortness of breath. He denies palpitations. He denies presyncope or syncope. He  has been tolerating atorvastatin 40 mg for his hyperlipidemia.  He denies bleeding with continuation of dual antiplatelet therapy.  He is unaware of any significant blood pressure elevation is current regimen consisting of amlodipine 5 mg, HCTZ 25 mg, lisinopril 40 mg, and metoprolol 50 mg twice a day.    He has obstructive sleep apnea and continues to use CPAP with 100% compliance.  He is unaware of breakthrough snoring.  He denies daytime  sleepiness.    I saw him in 2017 at which time he continued to remain stable.  I last saw him in June 2019 and he denied any recurrent episodes of chest pain, PND orthopnea.  He was exercising at least 3 days/week at the [facility] and was typically walking 1-1/2 to 2 miles at a time.  He continues to use CPAP with 100% compliance.  He had been bitten by a tick and received treatment for.with doxycycline.    Since my last evaluation, he has been seen the office in November 2020 by Coletta Memos, NP and most recently by Kerin Ransom in January 2022.  During his January 2022 evaluation he had admitted to some vague chest tightness which seem to occur at onset of walking but then would resolve.  He underwent a nuclear perfusion study on April 10, 2020 which remained low risk and showed normal perfusion without scar or ischemia.  He also underwent carotid studies in February 2022 which were essentially normal in the carotids but the right subclavian artery was stenotic.  Presently, he continues to be active.  If he were to walk fast uphill at onset of walking he does experience a mild tightness which ultimately resolves with continued walking.  He continues to use CPAP with 20% compliance and is followed by Dr. Annamaria Boots.  He has been on a regimen of amlodipine 10 mg, HCTZ 12.5 mg, lisinopril 40 mg, and metoprolol 50 mg twice a day for hypertension.  He  continues to be on atorvastatin 40 mg for hyperlipidemia.  He is on Plavix 75 mg daily for platelet inhibition and is not on aspirin.  He presents for evaluation.  Past Medical History:  Diagnosis Date  . Allergic rhinitis   . Cancer [facility])    Prostate cancer  . Coronary artery disease   . History of prostate cancer   . Hyperlipidemia   . Hypertension   . Hypokalemia   . Nephritis    as a child  . OSA (obstructive sleep apnea)    cpap machine at home  . Stroke [facility])    "mini stroke" TIA    Past Surgical History:  Procedure Laterality Date  . CARDIAC  CATHETERIZATION    . CAROTID STENT  11-08 and 4-11   stent x 3 (12/2006 x 2; 2011 x 1 stent)  . CATARACT EXTRACTION W/PHACO Left 08/21/2014   Procedure: PHACO EMULSION CATARACT EXTRACTION AND INTRAOCULAR LENS PLACEMENT ([facility]) IMPLANT LEFT EYE;  Surgeon: Marylynn Pearson, MD;  Location: [facility];  Service: Ophthalmology;  Laterality: Left;  . COLONOSCOPY  2015  . COLONOSCOPY W/ POLYPECTOMY    . CORONARY ANGIOPLASTY    . HERNIA REPAIR Bilateral    x 2   different times  . PROSTATE SURGERY     had cancer; radical resection 1997     Allergies  Allergen Reactions  . Other Hives    SEAFOOD  . Penicillins Other (See Comments)    "caused me to pass out"  . Sulfa Antibiotics Hives    Current Outpatient Medications  Medication Sig Dispense Refill  . atorvastatin (LIPITOR) 40 MG tablet TAKE 1 TABLET(40 MG) BY MOUTH DAILY 30 tablet 10  . isosorbide mononitrate (IMDUR) 30 MG 24 hr tablet Take 1 tablet (30 mg total) by mouth daily. 90 tablet 3  . amLODipine (NORVASC) 10 MG tablet Take 1 tablet (10 mg total) by mouth daily. 90 tablet 3  . Cholecalciferol (VITAMIN D) 2000 units CAPS Take 2,000 Units by mouth daily.    . clopidogrel (PLAVIX) 75 MG tablet Take 1 tablet (75 mg total) by mouth daily. 90 tablet 3  . cyclobenzaprine (FLEXERIL) 5 MG tablet One tab po qpm prn (Patient not taking: Reported on 03/25/2020) 20 tablet 0  . EPINEPHrine (EPIPEN 2-PAK) 0.3 mg/0.3 mL IJ SOAJ injection Inject 0.3 mLs (0.3 mg total) into the muscle as needed for anaphylaxis. 2 each 1  . hydrochlorothiazide (MICROZIDE) 12.5 MG capsule TAKE 1 CAPSULE(12.5 MG) BY MOUTH DAILY 90 capsule 2  . lisinopril (ZESTRIL) 40 MG tablet TAKE 1 TABLET BY MOUTH DAILY 90 tablet 3  . magnesium oxide (MAG-OX) 400 MG tablet Take 400 mg by mouth daily.    . metoprolol tartrate (LOPRESSOR) 50 MG tablet TAKE 1 TABLET(50 MG) BY MOUTH TWICE DAILY 60 tablet 6  . nitroGLYCERIN (NITROSTAT) 0.4 MG SL tablet Place 1 tablet (0.4 mg total) under the tongue  every 5 (five) minutes as needed for chest pain. 25 tablet 2   No current facility-administered medications for this visit.    Socially he is married has 3 children and 2 grandchildren. He does drink occasional alcohol. There is no tobacco use. He does exercise occasionally.  ROS General: Negative; No fevers, chills, or night sweats;  HEENT: Negative; No changes in vision or hearing, sinus congestion, difficulty swallowing Pulmonary: Negative; No cough, wheezing, shortness of breath, hemoptysis Cardiovascular: Negative; No chest pain, presyncope, syncope, palpitations GI: Negative; No nausea, vomiting, diarrhea, or abdominal pain GU: History of  prostate CA No dysuria, hematuria, or difficulty voiding Musculoskeletal: History of back discomfort intermittently; no myalgias, joint pain, or weakness Hematologic/Oncology: Negative; no easy bruising, bleeding Endocrine: Negative; no heat/cold intolerance; no diabetes Neuro: Negative; no changes in balance, headaches Skin: Negative; No rashes or skin lesions Psychiatric: Negative; No behavioral problems, depression Sleep: Obstructive sleep apnea on CPAP therapy.  He admits to 100% compliance.  No breakthrough snoring, daytime sleepiness, hypersomnolence, bruxism, restless legs, hypnogognic hallucinations, no cataplexy  An Epworth Sleepiness Scale score was calculated in the office today and this endorsed at 11 consistent with mild residual daytime sleepiness.  Other comprehensive 14 point system review is negative.  PE BP (!) 148/69   Ht 5' 10"  (1.778 m)   Wt 208 lb (94.3 kg)   SpO2 97%   BMI 29.84 kg/m    Repeat blood pressure by me was 146/74  Wt Readings from Last 3 Encounters:  07/01/20 208 lb (94.3 kg)  04/10/20 211 lb (95.7 kg)  03/25/20 211 lb 9.6 oz (96 kg)   General: Alert, oriented, no distress.  Skin: normal turgor, no rashes, warm and dry HEENT: Normocephalic, atraumatic. Pupils equal round and reactive to light;  sclera anicteric; extraocular muscles intact;  Nose without nasal septal hypertrophy Mouth/Parynx benign; Mallinpatti scale 3 Neck: No JVD, no carotid bruits; normal carotid upstroke Lungs: clear to ausculatation and percussion; no wheezing or rales Chest wall: without tenderness to palpitation Heart: PMI not displaced, RRR, s1 s2 normal, 1/6 systolic murmur, no diastolic murmur, no rubs, gallops, thrills, or heaves Abdomen: soft, nontender; no hepatosplenomehaly, BS+; abdominal aorta nontender and not dilated by palpation. Back: no CVA tenderness Pulses 2+ Musculoskeletal: full range of motion, normal strength, no joint deformities Extremities: no clubbing cyanosis or edema, Homan's sign negative  Neurologic: grossly nonfocal; Cranial nerves grossly wnl Psychologic: Normal mood and affect  An ECG was not done today but ECG from March 25, 2020 was personally reviewed and reveals sinus bradycardia at 54 bpm.  No ectopy.  Normal intervals.  August 10, 2017 ECG (independently read by me): Sinus Bradycardia 50 bpm.  Borderline criteria for LVH.  Normal intervals.  No ectopy.  June 2017 ECG (independently read by me): Sinus bradycardia 57 bpm.  Nonspecific T-wave in lead 03 March 2014 ECG (independently read by me): Sinus bradycardia at 57 bpm.  No significant ST segment changes.  November 2014 ECG sinus rhythm at 57 beats per minute. PR interval 190 ms QTc interval 377 ms  LABS: Laboratory from Dr. Bryon Lions from May 05, 2017.: Vitamin D level 55.  Hemoglobin A1c 5.5.  Total cholesterol 118, HDL 37, LDL 70, triglycerides 53 and VLDL 11.  Hemoglobin hematocrit 15.0 and 43.4.  Creatinine 1.07.  Normal LFTs.    BMP Latest Ref Rng & Units 03/05/2020 08/30/2019 02/20/2019  Glucose 65 - 99 mg/dL 78 83 69  BUN 8 - 27 mg/dL 12 14 14   Creatinine 0.76 - 1.27 mg/dL 1.20 1.07 1.13  BUN/Creat Ratio 10 - 24 10 13 12   Sodium 134 - 144 mmol/L 142 142 140  Potassium 3.5 - 5.2 mmol/L 4.2 3.7 3.8   Chloride 96 - 106 mmol/L 103 105 103  CO2 20 - 29 mmol/L 29 25 25   Calcium 8.6 - 10.2 mg/dL 8.8 9.0 8.7    Hepatic Function Latest Ref Rng & Units 03/05/2020 08/30/2019 02/20/2019  Total Protein 6.0 - 8.5 g/dL 6.8 6.9 6.7  Albumin 3.7 - 4.7 g/dL 4.2 4.1 4.1  AST 0 -  40 IU/L 16 25 19   ALT 0 - 44 IU/L 14 18 19   Alk Phosphatase 44 - 121 IU/L 78 75 73  Total Bilirubin 0.0 - 1.2 mg/dL 0.7 0.6 0.8    CBC Latest Ref Rng & Units 08/05/2015 08/21/2014 03/12/2014  WBC 3.8 - 10.8 K/uL 5.1 5.8 5.8  Hemoglobin 13.2 - 17.1 g/dL 14.1 15.0 14.5  Hematocrit 38.5 - 50.0 % 41.3 42.3 41.7  Platelets 140 - 400 K/uL 147 145(L) 144(L)    Lab Results  Component Value Date   MCV 88.2 08/05/2015   MCV 85.3 08/21/2014   MCV 87.8 03/12/2014    Lab Results  Component Value Date   TSH 0.73 08/05/2015   Lab Results  Component Value Date   HGBA1C 5.4 07/01/2012   Lipid Panel     Component Value Date/Time   CHOL 121 08/30/2019 1122   TRIG 65 08/30/2019 1122   HDL 41 08/30/2019 1122   CHOLHDL 3.0 08/30/2019 1122   CHOLHDL 3.1 08/05/2015 1050   VLDL 14 08/05/2015 1050   LDLCALC 66 08/30/2019 1122     RADIOLOGY: Ct Head (brain) Wo Contrast  06/30/2012   *RADIOLOGY REPORT*  Clinical Data: Acute onset right upper and lower extremity weakness.  Right-sided foot drop.  CT HEAD WITHOUT CONTRAST  Technique:  Contiguous axial images were obtained from the base of the skull through the vertex without contrast.  Comparison: None.  Findings: No evidence of acute infarct, acute hemorrhage, mass lesion, mass effect or hydrocephalus.  Minimal periventricular low attenuation.  Small retention cysts or polyps are seen in the left maxillary sinus.  IMPRESSION:  1.  No acute findings. 2.  Minimal chronic microvascular white matter ischemic changes.   Original Report Authenticated By: Lorin Picket, M.D.   Eugene Murray Head Wo Contrast  06/30/2012   *RADIOLOGY REPORT*  Clinical Data:  Right-sided weakness.  Hypertension.   History prostate cancer.  MRI BRAIN WITHOUT CONTRAST MRA HEAD WITHOUT CONTRAST  Technique: Multiplanar, multiecho pulse sequences of the brain and surrounding structures were obtained according to standard protocol without intravenous contrast.  Angiographic images of the head were obtained using MRA technique without contrast.  Comparison: 06/30/2012 head CT.  No comparison brain Eugene.  MRI HEAD  Findings:  Question tiny acute infarct left pontomedullary junction.  Moderate white matter type changes most consistent with result of small vessel disease.  No intracranial hemorrhage.  No hydrocephalus.  No intracranial mass lesion detected on this unenhanced exam.  Major intracranial vascular structures are patent.  Upper cervical cord is of decreased caliber of questionable significance/etiology.  Cervical medullary junction, pituitary region and pineal region unremarkable.  Exophthalmos.  IMPRESSION: Question tiny acute infarct left pontomedullary junction.  Moderate white matter type changes most consistent with result of small vessel disease.  Please see above  MRA HEAD  Findings: Anterior circulation without medium or large size vessel significant stenosis or occlusion.  Middle cerebral artery mild branch vessel irregularity bilaterally.  Mild narrowing distal M1 segment left middle cerebral artery.  Ectatic vertebral arteries and basilar artery.  Nonvisualization left PICA and right AICA.  Mild branch vessel irregularity superior cerebellar artery and posterior cerebral artery bilaterally.  No aneurysm or vascular malformation noted.  IMPRESSION: Mild intracranial atherosclerotic type changes as detailed above the   Original Report Authenticated By: Genia Del, M.D.   Eugene Brain Wo Contrast  06/30/2012   *RADIOLOGY REPORT*  Clinical Data:  Right-sided weakness.  Hypertension.  History prostate cancer.  MRI BRAIN  WITHOUT CONTRAST MRA HEAD WITHOUT CONTRAST  Technique: Multiplanar, multiecho pulse sequences of the  brain and surrounding structures were obtained according to standard protocol without intravenous contrast.  Angiographic images of the head were obtained using MRA technique without contrast.  Comparison: 06/30/2012 head CT.  No comparison brain Eugene.  MRI HEAD  Findings:  Question tiny acute infarct left pontomedullary junction.  Moderate white matter type changes most consistent with result of small vessel disease.  No intracranial hemorrhage.  No hydrocephalus.  No intracranial mass lesion detected on this unenhanced exam.  Major intracranial vascular structures are patent.  Upper cervical cord is of decreased caliber of questionable significance/etiology.  Cervical medullary junction, pituitary region and pineal region unremarkable.  Exophthalmos.  IMPRESSION: Question tiny acute infarct left pontomedullary junction.  Moderate white matter type changes most consistent with result of small vessel disease.  Please see above  MRA HEAD  Findings: Anterior circulation without medium or large size vessel significant stenosis or occlusion.  Middle cerebral artery mild branch vessel irregularity bilaterally.  Mild narrowing distal M1 segment left middle cerebral artery.  Ectatic vertebral arteries and basilar artery.  Nonvisualization left PICA and right AICA.  Mild branch vessel irregularity superior cerebellar artery and posterior cerebral artery bilaterally.  No aneurysm or vascular malformation noted.  IMPRESSION: Mild intracranial atherosclerotic type changes as detailed above the   Original Report Authenticated By: Genia Del, M.D.    IMPRESSION: Encounter Diagnoses  Name Primary?  . CAD S/P percutaneous coronary angioplasty   . Angina, class II ([place])   . Essential hypertension   . OSA (obstructive sleep apnea)   . Hyperlipidemia with target LDL less than 70   . Stenosis of right subclavian artery [facility])     ASSESSMENT AND PLAN: Eugene. Einer Meals is an 80 year old African-American male who has CAD  and underwent initial percutaneous coronary intervention in November 2008 to his diagonal and RCA.  In  April 2011 he was found to have a new 95% stenosis beyond his distal RCA stent for which a new DES stent was inserted.  He underwent a follow-up nuclear perfusion study on April 10, 2020 after experiencing some mild chest pressure with walking onset.  This was essentially normal with nuclear stress EF at 55% without evidence for prior infarct or ischemia.  Due to a right carotid bruit he underwent carotid ultrasound on April 10, 2020 which did not reveal any carotid disease.  However the right subclavian artery was stenotic.  He had normal flow hemodynamics in the left subclavian artery.  Presently, he he has continued to experience some very mild class II anginal symptoms at rapid walk onset which resolves with continued walking.  His blood pressure on recheck by me today was 146/74.  I have recommended the addition of isosorbide mononitrate 30 mg daily which will be beneficial both for mild blood pressure elevation as well as for his class II angina pectoris.  He continues to be on amlodipine 10 mg, lisinopril 40 mg, and metoprolol tartrate 50 mg twice a day in addition to HCTZ 12.5 mg daily.  Tinges to be on Plavix alone without aspirin.  He is on atorvastatin 40 mg.  Lipid studies in July 2021 showed an LDL of 66.  He admits to 100% CPAP use and is sleeping well.  Epworth sleepiness score today is consistent with mild residual daytime sleepiness.  I will see him in 6 months for reevaluation.  Shelva Majestic 07/01/2020 10:51 AM

## 2020-06-30 NOTE — Patient Instructions (Addendum)
Medication Instructions:  Start taking Isosorbide Mononitrate 30 mg tablet at bedtime  *If you need a refill on your cardiac medications before your next appointment, please call your pharmacy*   Lab Work: Not needed    Testing/Procedures: Not needed   Follow-Up: At [facility], you and your health needs are our priority.  As part of our continuing mission to provide you with exceptional heart care, we have created designated Provider Care Teams.  These Care Teams include your primary Cardiologist (physician) and Advanced Practice Providers (APPs -  Physician Assistants and Nurse Practitioners) who all work together to provide you with the care you need, when you need it.     Your next appointment:   6 month(s)  The format for your next appointment:   In Person  Provider:   Shelva Majestic, MD   Other Instructions

## 2020-07-01 ENCOUNTER — Encounter: Payer: Self-pay | Admitting: Cardiovascular Disease

## 2020-07-12 DIAGNOSIS — Z20822 Contact with and (suspected) exposure to covid-19: Secondary | ICD-10-CM | POA: Diagnosis not present

## 2020-08-04 ENCOUNTER — Other Ambulatory Visit: Payer: Self-pay | Admitting: Cardiovascular Disease

## 2020-09-11 ENCOUNTER — Encounter: Payer: Self-pay | Admitting: Internal Medicine

## 2020-09-11 ENCOUNTER — Ambulatory Visit (INDEPENDENT_AMBULATORY_CARE_PROVIDER_SITE_OTHER): Payer: Medicare Other

## 2020-09-11 ENCOUNTER — Ambulatory Visit (INDEPENDENT_AMBULATORY_CARE_PROVIDER_SITE_OTHER): Payer: Medicare Other | Admitting: Internal Medicine

## 2020-09-11 ENCOUNTER — Other Ambulatory Visit: Payer: Self-pay

## 2020-09-11 VITALS — BP 124/72 | HR 54 | Temp 97.5°F | Ht 70.0 in | Wt 209.0 lb

## 2020-09-11 DIAGNOSIS — I10 Essential (primary) hypertension: Secondary | ICD-10-CM | POA: Diagnosis not present

## 2020-09-11 DIAGNOSIS — G8929 Other chronic pain: Secondary | ICD-10-CM | POA: Diagnosis not present

## 2020-09-11 DIAGNOSIS — M25562 Pain in left knee: Secondary | ICD-10-CM

## 2020-09-11 DIAGNOSIS — I119 Hypertensive heart disease without heart failure: Secondary | ICD-10-CM | POA: Diagnosis not present

## 2020-09-11 DIAGNOSIS — E7800 Pure hypercholesterolemia, unspecified: Secondary | ICD-10-CM | POA: Diagnosis not present

## 2020-09-11 DIAGNOSIS — I209 Angina pectoris, unspecified: Secondary | ICD-10-CM

## 2020-09-11 DIAGNOSIS — Z9989 Dependence on other enabling machines and devices: Secondary | ICD-10-CM | POA: Diagnosis not present

## 2020-09-11 DIAGNOSIS — Z0000 Encounter for general adult medical examination without abnormal findings: Secondary | ICD-10-CM | POA: Diagnosis not present

## 2020-09-11 DIAGNOSIS — G4733 Obstructive sleep apnea (adult) (pediatric): Secondary | ICD-10-CM

## 2020-09-11 DIAGNOSIS — Z23 Encounter for immunization: Secondary | ICD-10-CM

## 2020-09-11 LAB — POCT URINALYSIS DIPSTICK
Bilirubin, UA: NEGATIVE
Blood, UA: NEGATIVE
Glucose, UA: NEGATIVE
Ketones, UA: NEGATIVE
Leukocytes, UA: NEGATIVE
Nitrite, UA: NEGATIVE
Protein, UA: NEGATIVE
Spec Grav, UA: 1.02 (ref 1.010–1.025)
Urobilinogen, UA: 0.2 E.U./dL
pH, UA: 7 (ref 5.0–8.0)

## 2020-09-11 LAB — POCT UA - MICROALBUMIN
Albumin/Creatinine Ratio, Urine, POC: <30
Creatinine, POC: 200 mg/dL
Microalbumin Ur, POC: 10 mg/L

## 2020-09-11 MED ORDER — SHINGRIX 50 MCG/0.5ML IM SUSR: 0.5000 mL | Freq: Once | INTRAMUSCULAR | 0 refills | Status: AC

## 2020-09-11 NOTE — Progress Notes (Signed)
This visit occurred during the SARS-CoV-2 public health emergency.  Safety protocols were in place, including screening questions prior to the visit, additional usage of staff PPE, and extensive cleaning of exam room while observing appropriate contact time as indicated for disinfecting solutions.  Subjective:   Eugene Murray is a 80 y.o. male who presents for Medicare Annual/Subsequent preventive examination.  Review of Systems     Cardiac Risk Factors include: advanced age (>52men, >59 women);dyslipidemia;male gender     Objective:    Today's Vitals   09/11/20 1107  BP: 124/72  Pulse: (!) 54  Temp: (!) 97.5 F (36.4 C)  TempSrc: Oral  Weight: 209 lb (94.8 kg)  Height: 5\' 10"  (1.778 m)   Body mass index is 29.99 kg/m.  Advanced Directives 09/11/2020 08/30/2019 08/29/2018 03/19/2015 08/21/2014 06/30/2012  Does Patient Have a Medical Advance Directive? Yes No No No No Patient does not have advance directive;Patient would not like information  Type of Scientist, forensic Power of [place];Living will - - - - -  Copy of [place] in Chart? No - copy requested - - - - -  Would patient like information on creating a medical advance directive? - No - Patient declined No - Patient declined No - patient declined information Yes - Educational materials given -    Current Medications (verified) Outpatient Encounter Medications as of 09/11/2020  Medication Sig   amLODipine (NORVASC) 10 MG tablet Take 1 tablet (10 mg total) by mouth daily.   atorvastatin (LIPITOR) 40 MG tablet TAKE 1 TABLET(40 MG) BY MOUTH DAILY   Cholecalciferol (VITAMIN D) 2000 units CAPS Take 2,000 Units by mouth daily.   clopidogrel (PLAVIX) 75 MG tablet Take 1 tablet (75 mg total) by mouth daily.   EPINEPHrine (EPIPEN 2-PAK) 0.3 mg/0.3 mL IJ SOAJ injection Inject 0.3 mLs (0.3 mg total) into the muscle as needed for anaphylaxis.   hydrochlorothiazide (MICROZIDE) 12.5 MG capsule TAKE 1  CAPSULE(12.5 MG) BY MOUTH DAILY   isosorbide mononitrate (IMDUR) 30 MG 24 hr tablet Take 1 tablet (30 mg total) by mouth daily.   lisinopril (ZESTRIL) 40 MG tablet TAKE 1 TABLET BY MOUTH DAILY   magnesium oxide (MAG-OX) 400 MG tablet Take 400 mg by mouth daily.   metoprolol tartrate (LOPRESSOR) 50 MG tablet TAKE 1 TABLET(50 MG) BY MOUTH TWICE DAILY   nitroGLYCERIN (NITROSTAT) 0.4 MG SL tablet Place 1 tablet (0.4 mg total) under the tongue every 5 (five) minutes as needed for chest pain.   No facility-administered encounter medications on file as of 09/11/2020.    Allergies (verified) Other, Penicillins, and Sulfa antibiotics   History: Past Medical History:  Diagnosis Date   Allergic rhinitis    Cancer ([place])    Prostate cancer   Coronary artery disease    History of prostate cancer    Hyperlipidemia    Hypertension    Hypokalemia    Nephritis    as a child   OSA (obstructive sleep apnea)    cpap machine at home   Stroke [facility])    "mini stroke" TIA   Past Surgical History:  Procedure Laterality Date   CARDIAC CATHETERIZATION     CAROTID STENT  11-08 and 4-11   stent x 3 (12/2006 x 2; 2011 x 1 stent)   CATARACT EXTRACTION W/PHACO Left 08/21/2014   Procedure: PHACO EMULSION CATARACT EXTRACTION AND INTRAOCULAR LENS PLACEMENT ([facility]) IMPLANT LEFT EYE;  Surgeon: Marylynn Pearson, MD;  Location: [place];  Service: Ophthalmology;  Laterality: Left;   COLONOSCOPY  2015   COLONOSCOPY W/ POLYPECTOMY     CORONARY ANGIOPLASTY     HERNIA REPAIR Bilateral    x 2   different times   PROSTATE SURGERY     had cancer; radical resection 1997    Family History  Problem Relation Age of Onset   Colon cancer Father        possibly age 50 y.o diagnosed    Stroke Mother    Asthma Sister    Social History   Socioeconomic History   Marital status: Married    Spouse name: Hart Robinsons   Number of children: 3   Years of education: College   Highest education level: Not on file  Occupational History    Occupation: retired  Tobacco Use   Smoking status: Former    Years: 15.00    Types: Cigarettes    Quit date: 09/08/1985    Years since quitting: 35.0   Smokeless tobacco: Never  Vaping Use   Vaping Use: Never used  Substance and Sexual Activity   Alcohol use: Not Currently    Comment: seldom   Drug use: No   Sexual activity: Yes  Other Topics Concern   Not on file  Social History Narrative   3 kids    Retired Arboriculturist    Former smoker quit 1980s. Denies etOH, other drugs    Patient lives at home with family. Patient lives at home with his wife Hart Robinsons)   Caffeine Use: 1 glass of tea every other day   Social Determinants of Health   Financial Resource Strain: Low Risk    Difficulty of Paying Living Expenses: Not hard at all  Food Insecurity: No Food Insecurity   Worried About Charity fundraiser in the Last Year: Never true   [place] in the Last Year: Never true  Transportation Needs: No Transportation Needs   Lack of Transportation (Medical): No   Lack of Transportation (Non-Medical): No  Physical Activity: Sufficiently Active   Days of Exercise per Week: 2 days   Minutes of Exercise per Session: 90 min  Stress: No Stress Concern Present   Feeling of Stress : Not at all  Social Connections: Not on file    Tobacco Counseling Counseling given: Not Answered   Clinical Intake:  Pre-visit preparation completed: Yes  Pain : No/denies pain     Nutritional Status: BMI 25 -29 Overweight Nutritional Risks: None Diabetes: No  How often do you need to have someone help you when you read instructions, pamphlets, or other written materials from your doctor or pharmacy?: 1 - Never What is the last grade level you completed in school?: 81yrs college  Diabetic? no  Interpreter Needed?: No  Information entered by :: NAllen LPN   Activities of Daily Living In your present state of health, do you have any difficulty performing the following activities: 09/11/2020  09/11/2020  Hearing? N Y  Vision? Y Y  Comment a little fuzzy -  Difficulty concentrating or making decisions? N N  Walking or climbing stairs? N Y  Dressing or bathing? N N  Doing errands, shopping? N N  Preparing Food and eating ? N -  Using the Toilet? N -  In the past six months, have you accidently leaked urine? N -  Comment just once -  Do you have problems with loss of bowel control? N -  Managing your Medications? N -  Managing your Finances? N -  Housekeeping or managing your Housekeeping? N -  Some recent data might be hidden    Patient Care Team: [name], MD as PCP - General (Internal Medicine) Troy Sine, MD as PCP - Cardiology (Cardiology) Little, Claudette Stapler, RN as Case Manager  Indicate any recent Medical Services you may have received from other than [facility] providers in the past year (date may be approximate).     Assessment:   This is a routine wellness examination for [place].  Hearing/Vision screen Vision Screening - Comments:: Regular eye exams, Dr. Venetia Maxon  Dietary issues and exercise activities discussed: Current Exercise Habits: Home exercise routine, Type of exercise: walking;strength training/weights, Time (Minutes): > 60, Frequency (Times/Week): 2, Weekly Exercise (Minutes/Week): 0   Goals Addressed             This Visit's Progress    Patient Stated       09/11/2020, no goals       Depression Screen PHQ 2/9 Scores 09/11/2020 09/11/2020 08/30/2019 08/30/2019 08/29/2018 08/29/2018  PHQ - 2 Score 0 0 0 0 0 0  PHQ- 9 Score - - 0 - 0 -    Fall Risk Fall Risk  09/11/2020 09/11/2020 08/30/2019 02/20/2019 08/29/2018  Falls in the past year? 1 1 0 0 0  Number falls in past yr: 0 0 - - -  Injury with Fall? 0 0 - - -  Risk for fall due to : Medication side effect - Medication side effect - -  Follow up Falls evaluation completed;Education provided;Falls prevention discussed - Falls evaluation completed;Education provided;Falls prevention discussed -  Falls evaluation completed;Education provided;Falls prevention discussed    FALL RISK PREVENTION PERTAINING TO THE HOME:  Any stairs in or around the home? Yes  If so, are there any without handrails? No  Home free of loose throw rugs in walkways, pet beds, electrical cords, etc? Yes  Adequate lighting in your home to reduce risk of falls? Yes   ASSISTIVE DEVICES UTILIZED TO PREVENT FALLS:  Life alert? No  Use of a cane, walker or w/c? No  Grab bars in the bathroom? No  Shower chair or bench in shower? Yes  Elevated toilet seat or a handicapped toilet? Yes   TIMED UP AND GO:  Was the test performed? No .    Gait steady and fast without use of assistive device  Cognitive Function:     6CIT Screen 09/11/2020 08/30/2019 08/29/2018  What Year? 0 points 0 points 0 points  What month? 0 points 0 points 0 points  What time? 0 points 0 points 0 points  Count back from 20 0 points 0 points 0 points  Months in reverse 0 points 0 points 0 points  Repeat phrase 4 points 0 points 0 points  Total Score 4 0 0    Immunizations Immunization History  Administered Date(s) Administered   Fluad Quad(high Dose 65+) 03/05/2020   Influenza, High Dose Seasonal PF 02/27/2019   Influenza,inj,Quad PF,6+ Mos 12/05/2012, 12/06/2013, 02/10/2015, 02/10/2016   PFIZER(Purple Top)SARS-COV-2 Vaccination 04/06/2019, 04/27/2019, 12/26/2019   Pneumococcal Polysaccharide-23 02/20/2019   Zoster, Live 01/10/2013    TDAP status: Up to date  Flu Vaccine status: Up to date  Pneumococcal vaccine status: Up to date  Covid-19 vaccine status: Completed vaccines  Qualifies for Shingles Vaccine? Yes   Zostavax completed Yes   Shingrix Completed?: No.    Education has been provided regarding the importance of this vaccine. Patient has been advised to call insurance company to determine out of  pocket expense if they have not yet received this vaccine. Advised may also receive vaccine at local pharmacy or Health  Dept. Verbalized acceptance and understanding.  Screening Tests Health Maintenance  Topic Date Due   Zoster Vaccines- Shingrix (1 of 2) Never done   COVID-19 Vaccine (4 - Booster for Pfizer series) 03/27/2020   INFLUENZA VACCINE  09/29/2020   TETANUS/TDAP  04/01/2026   PNA vac Low Risk Adult  Completed   HPV VACCINES  Aged Out    Health Maintenance  Health Maintenance Due  Topic Date Due   Zoster Vaccines- Shingrix (1 of 2) Never done   COVID-19 Vaccine (4 - Booster for Pfizer series) 03/27/2020    Colorectal cancer screening: No longer required.   Lung Cancer Screening: (Low Dose CT Chest recommended if Age 102-80 years, 30 pack-year currently smoking OR have quit w/in 15years.) does not qualify.   Lung Cancer Screening Referral: no  Additional Screening:  Hepatitis C Screening: does not qualify;  Vision Screening: Recommended annual ophthalmology exams for early detection of glaucoma and other disorders of the eye. Is the patient up to date with their annual eye exam?  Yes  Who is the provider or what is the name of the office in which the patient attends annual eye exams? Dr. Venetia Maxon If pt is not established with a provider, would they like to be referred to a provider to establish care? No .   Dental Screening: Recommended annual dental exams for proper oral hygiene  Community Resource Referral / Chronic Care Management: CRR required this visit?  No   CCM required this visit?  No      Plan:     I have personally reviewed and noted the following in the patient's chart:   Medical and social history Use of alcohol, tobacco or illicit drugs  Current medications and supplements including opioid prescriptions. Patient is not currently taking opioid prescriptions. Functional ability and status Nutritional status Physical activity Advanced directives List of other physicians Hospitalizations, surgeries, and ER visits in previous 12 months Vitals Screenings to  include cognitive, depression, and falls Referrals and appointments  In addition, I have reviewed and discussed with patient certain preventive protocols, quality metrics, and best practice recommendations. A written personalized care plan for preventive services as well as general preventive health recommendations were provided to patient.     Kellie Simmering, LPN   8/34/1962   Nurse Notes:

## 2020-09-11 NOTE — Progress Notes (Signed)
I,Katawbba Wiggins,acting as a Education administrator for Maximino Greenland, MD.,have documented all relevant documentation on the behalf of Maximino Greenland, MD,as directed by  Maximino Greenland, MD while in the presence of Maximino Greenland, MD.  This visit occurred during the SARS-CoV-2 public health emergency.  Safety protocols were in place, including screening questions prior to the visit, additional usage of staff PPE, and extensive cleaning of exam room while observing appropriate contact time as indicated for disinfecting solutions.  Subjective:     Patient ID: Eugene Murray , male    DOB: 1940/03/14 , 80 y.o.   MRN: [id number]   Chief Complaint  Patient presents with   Hypertension   Hyperlipidemia    HPI  He presents today for BP and cholesterol f/u. He reports compliance with meds. He denies headaches, palpitations and fatigue. Has had cp/sob - improved with use of Imdur.  He is also scheduled for AWV with [facility] Advisor.   Hypertension This is a chronic problem. The current episode started more than 1 year ago. The problem has been gradually improving since onset. The problem is controlled. Pertinent negatives include no headaches. Risk factors for coronary artery disease include dyslipidemia and male gender. The current treatment provides moderate improvement. Hypertensive end-organ damage includes CAD/MI.  Hyperlipidemia This is a chronic problem. The current episode started more than 1 year ago. He has no history of diabetes. Current antihyperlipidemic treatment includes statins.    Past Medical History:  Diagnosis Date   Allergic rhinitis    Cancer ([place])    Prostate cancer   Coronary artery disease    History of prostate cancer    Hyperlipidemia    Hypertension    Hypokalemia    Nephritis    as a child   OSA (obstructive sleep apnea)    cpap machine at home   Stroke [facility])    "mini stroke" TIA     Family History  Problem Relation Age of Onset   Colon cancer Father        possibly  age 20 y.o diagnosed    Stroke Mother    Asthma Sister      Current Outpatient Medications:    amLODipine (NORVASC) 10 MG tablet, Take 1 tablet (10 mg total) by mouth daily., Disp: 90 tablet, Rfl: 3   atorvastatin (LIPITOR) 40 MG tablet, TAKE 1 TABLET(40 MG) BY MOUTH DAILY, Disp: 30 tablet, Rfl: 10   Cholecalciferol (VITAMIN D) 2000 units CAPS, Take 2,000 Units by mouth daily., Disp: , Rfl:    clopidogrel (PLAVIX) 75 MG tablet, Take 1 tablet (75 mg total) by mouth daily., Disp: 90 tablet, Rfl: 3   EPINEPHrine (EPIPEN 2-PAK) 0.3 mg/0.3 mL IJ SOAJ injection, Inject 0.3 mLs (0.3 mg total) into the muscle as needed for anaphylaxis., Disp: 2 each, Rfl: 1   hydrochlorothiazide (MICROZIDE) 12.5 MG capsule, TAKE 1 CAPSULE(12.5 MG) BY MOUTH DAILY, Disp: 90 capsule, Rfl: 2   isosorbide mononitrate (IMDUR) 30 MG 24 hr tablet, Take 1 tablet (30 mg total) by mouth daily., Disp: 90 tablet, Rfl: 3   lisinopril (ZESTRIL) 40 MG tablet, TAKE 1 TABLET BY MOUTH DAILY, Disp: 90 tablet, Rfl: 3   magnesium oxide (MAG-OX) 400 MG tablet, Take 400 mg by mouth daily., Disp: , Rfl:    metoprolol tartrate (LOPRESSOR) 50 MG tablet, TAKE 1 TABLET(50 MG) BY MOUTH TWICE DAILY, Disp: 180 tablet, Rfl: 3   nitroGLYCERIN (NITROSTAT) 0.4 MG SL tablet, Place 1 tablet (0.4 mg total) under the  tongue every 5 (five) minutes as needed for chest pain., Disp: 25 tablet, Rfl: 2   Allergies  Allergen Reactions   Other Hives    SEAFOOD   Penicillins Other (See Comments)    "caused me to pass out"   Sulfa Antibiotics Hives     Review of Systems  Constitutional: Negative.   Respiratory: Negative.    Cardiovascular: Negative.   Gastrointestinal: Negative.   Musculoskeletal:  Positive for arthralgias.       He c/o worsening left knee pain. Denies fall/trauma. Exacerbated by walking upstairs. Had a fall 2-3 months ago. He states he slipped on the floor on liquid bubbles that his granddaughter had spilled on the floor. He denies having  any injuries.   Neurological: Negative.  Negative for headaches.  Psychiatric/Behavioral: Negative.      Today's Vitals   09/11/20 1042  BP: 124/72  Pulse: (!) 54  Temp: (!) 97.5 F (36.4 C)  Weight: 209 lb (94.8 kg)  Height: '5\' 10"'  (1.778 m)  PainSc: 0-No pain   Body mass index is 29.99 kg/m.  Wt Readings from Last 3 Encounters:  09/11/20 209 lb (94.8 kg)  09/11/20 209 lb (94.8 kg)  07/01/20 208 lb (94.3 kg)    BP Readings from Last 3 Encounters:  09/11/20 124/72  09/11/20 124/72  07/01/20 (!) 148/69    Objective:  Physical Exam Vitals and nursing note reviewed.  Constitutional:      Appearance: Normal appearance.  HENT:     Head: Normocephalic and atraumatic.     Nose:     Comments: Masked     Mouth/Throat:     Comments: Masked  Eyes:     Extraocular Movements: Extraocular movements intact.     Pupils: Pupils are equal, round, and reactive to light.  Cardiovascular:     Rate and Rhythm: Normal rate and regular rhythm.     Heart sounds: Normal heart sounds.  Pulmonary:     Effort: Pulmonary effort is normal.     Breath sounds: Normal breath sounds.  Genitourinary:    Comments: deferred Musculoskeletal:        General: Tenderness present.     Cervical back: Normal range of motion.     Comments: crepitus  Skin:    General: Skin is warm and dry.  Neurological:     General: No focal deficit present.     Mental Status: He is alert and oriented to person, place, and time.  Psychiatric:        Mood and Affect: Mood normal.        Behavior: Behavior normal.        Assessment And Plan:     1. Hypertensive heart disease without heart failure Comments: Chronic, well controlled. He is encouraged to follow low sodium diet. I will check renal function today.  Cardiology note reviewed in full detail.  - Lipid panel - CMP14+EGFR - CBC no Diff  2. Pure hypercholesterolemia Comments: Chronic, I will check lipid panel today. He is encouraged to follow heart  healthy diet.  - Lipid panel  3. OSA on CPAP Comments: Importance of CPAP compliance was d/w pt. Advised to aim for at least 4 hours per night.   4. Chronic pain of left knee Comments: I will refer him to [place] for further evaluation.  - Ambulatory referral to Orthopedic Surgery  5. Immunization due Comments: I will send rx Shingrix to local pharmacy.    Patient was given opportunity to ask questions.  Patient verbalized understanding of the plan and was able to repeat key elements of the plan. All questions were answered to their satisfaction.   I, Maximino Greenland, MD, have reviewed all documentation for this visit. The documentation on 09/14/20 for the exam, diagnosis, procedures, and orders are all accurate and complete.   IF YOU HAVE BEEN REFERRED TO A SPECIALIST, IT MAY TAKE 1-2 WEEKS TO SCHEDULE/PROCESS THE REFERRAL. IF YOU HAVE NOT HEARD FROM US/SPECIALIST IN TWO WEEKS, PLEASE GIVE [place] A CALL AT [phone] X [phone].   THE PATIENT IS ENCOURAGED TO PRACTICE SOCIAL DISTANCING DUE TO THE COVID-19 PANDEMIC.

## 2020-09-11 NOTE — Patient Instructions (Signed)
[name] , Thank you for taking time to come for your Medicare Wellness Visit. I appreciate your ongoing commitment to your health goals. Please review the following plan we discussed and let me know if I can assist you in the future.   Screening recommendations/referrals: Colonoscopy: not required Recommended yearly ophthalmology/optometry visit for glaucoma screening and checkup Recommended yearly dental visit for hygiene and checkup  Vaccinations: Influenza vaccine: completed 03/05/2020, due 09/29/2020 Pneumococcal vaccine: completed 02/20/2019 Tdap vaccine: completed 04/13/2016, due 04/13/2026 Shingles vaccine: discussed   Covid-19:  12/26/2019, 04/27/2019, 04/06/2019  Advanced directives: Please bring a copy of your POA (Power of Attorney) and/or Living Will to your next appointment.   Conditions/risks identified: none  Next appointment: Follow up in one year for your annual wellness visit.   Preventive Care 26 Years and Older, Male Preventive care refers to lifestyle choices and visits with your health care provider that can promote health and wellness. What does preventive care include? A yearly physical exam. This is also called an annual well check. Dental exams once or twice a year. Routine eye exams. Ask your health care provider how often you should have your eyes checked. Personal lifestyle choices, including: Daily care of your teeth and gums. Regular physical activity. Eating a healthy diet. Avoiding tobacco and drug use. Limiting alcohol use. Practicing safe sex. Taking low doses of aspirin every day. Taking vitamin and mineral supplements as recommended by your health care provider. What happens during an annual well check? The services and screenings done by your health care provider during your annual well check will depend on your age, overall health, lifestyle risk factors, and family history of disease. Counseling  Your health care provider may ask you questions  about your: Alcohol use. Tobacco use. Drug use. Emotional well-being. Home and relationship well-being. Sexual activity. Eating habits. History of falls. Memory and ability to understand (cognition). Work and work Statistician. Screening  You may have the following tests or measurements: Height, weight, and BMI. Blood pressure. Lipid and cholesterol levels. These may be checked every 5 years, or more frequently if you are over 54 years old. Skin check. Lung cancer screening. You may have this screening every year starting at age 46 if you have a 30-pack-year history of smoking and currently smoke or have quit within the past 15 years. Fecal occult blood test (FOBT) of the stool. You may have this test every year starting at age 16. Flexible sigmoidoscopy or colonoscopy. You may have a sigmoidoscopy every 5 years or a colonoscopy every 10 years starting at age 72. Prostate cancer screening. Recommendations will vary depending on your family history and other risks. Hepatitis C blood test. Hepatitis B blood test. Sexually transmitted disease (STD) testing. Diabetes screening. This is done by checking your blood sugar (glucose) after you have not eaten for a while (fasting). You may have this done every 1-3 years. Abdominal aortic aneurysm (AAA) screening. You may need this if you are a current or former smoker. Osteoporosis. You may be screened starting at age 55 if you are at high risk. Talk with your health care provider about your test results, treatment options, and if necessary, the need for more tests. Vaccines  Your health care provider may recommend certain vaccines, such as: Influenza vaccine. This is recommended every year. Tetanus, diphtheria, and acellular pertussis (Tdap, Td) vaccine. You may need a Td booster every 10 years. Zoster vaccine. You may need this after age 66. Pneumococcal 13-valent conjugate (PCV13) vaccine. One dose  is recommended after age 70. Pneumococcal  polysaccharide (PPSV23) vaccine. One dose is recommended after age 75. Talk to your health care provider about which screenings and vaccines you need and how often you need them. This information is not intended to replace advice given to you by your health care provider. Make sure you discuss any questions you have with your health care provider. Document Released: 03/14/2015 Document Revised: 11/05/2015 Document Reviewed: 12/17/2014 Elsevier Interactive Patient Education  2017 [place] Prevention in the Home Falls can cause injuries. They can happen to people of all ages. There are many things you can do to make your home safe and to help prevent falls. What can I do on the outside of my home? Regularly fix the edges of walkways and driveways and fix any cracks. Remove anything that might make you trip as you walk through a door, such as a raised step or threshold. Trim any bushes or trees on the path to your home. Use bright outdoor lighting. Clear any walking paths of anything that might make someone trip, such as rocks or tools. Regularly check to see if handrails are loose or broken. Make sure that both sides of any steps have handrails. Any raised decks and porches should have guardrails on the edges. Have any leaves, snow, or ice cleared regularly. Use sand or salt on walking paths during winter. Clean up any spills in your garage right away. This includes oil or grease spills. What can I do in the bathroom? Use night lights. Install grab bars by the toilet and in the tub and shower. Do not use towel bars as grab bars. Use non-skid mats or decals in the tub or shower. If you need to sit down in the shower, use a plastic, non-slip stool. Keep the floor dry. Clean up any water that spills on the floor as soon as it happens. Remove soap buildup in the tub or shower regularly. Attach bath mats securely with double-sided non-slip rug tape. Do not have throw rugs and other  things on the floor that can make you trip. What can I do in the bedroom? Use night lights. Make sure that you have a light by your bed that is easy to reach. Do not use any sheets or blankets that are too big for your bed. They should not hang down onto the floor. Have a firm chair that has side arms. You can use this for support while you get dressed. Do not have throw rugs and other things on the floor that can make you trip. What can I do in the kitchen? Clean up any spills right away. Avoid walking on wet floors. Keep items that you use a lot in easy-to-reach places. If you need to reach something above you, use a strong step stool that has a grab bar. Keep electrical cords out of the way. Do not use floor polish or wax that makes floors slippery. If you must use wax, use non-skid floor wax. Do not have throw rugs and other things on the floor that can make you trip. What can I do with my stairs? Do not leave any items on the stairs. Make sure that there are handrails on both sides of the stairs and use them. Fix handrails that are broken or loose. Make sure that handrails are as long as the stairways. Check any carpeting to make sure that it is firmly attached to the stairs. Fix any carpet that is loose or worn. Avoid  having throw rugs at the top or bottom of the stairs. If you do have throw rugs, attach them to the floor with carpet tape. Make sure that you have a light switch at the top of the stairs and the bottom of the stairs. If you do not have them, ask someone to add them for you. What else can I do to help prevent falls? Wear shoes that: Do not have high heels. Have rubber bottoms. Are comfortable and fit you well. Are closed at the toe. Do not wear sandals. If you use a stepladder: Make sure that it is fully opened. Do not climb a closed stepladder. Make sure that both sides of the stepladder are locked into place. Ask someone to hold it for you, if possible. Clearly  mark and make sure that you can see: Any grab bars or handrails. First and last steps. Where the edge of each step is. Use tools that help you move around (mobility aids) if they are needed. These include: Canes. Walkers. Scooters. Crutches. Turn on the lights when you go into a dark area. Replace any light bulbs as soon as they burn out. Set up your furniture so you have a clear path. Avoid moving your furniture around. If any of your floors are uneven, fix them. If there are any pets around you, be aware of where they are. Review your medicines with your doctor. Some medicines can make you feel dizzy. This can increase your chance of falling. Ask your doctor what other things that you can do to help prevent falls. This information is not intended to replace advice given to you by your health care provider. Make sure you discuss any questions you have with your health care provider. Document Released: 12/12/2008 Document Revised: 07/24/2015 Document Reviewed: 03/22/2014 Elsevier Interactive Patient Education  2017 [employer].

## 2020-09-11 NOTE — Patient Instructions (Signed)
Mediterranean Diet A Mediterranean diet refers to food and lifestyle choices that are based on the traditions of countries located on the [place]. This way of eating has been shown to help prevent certain conditions and improve outcomes for people who have chronic diseases, like kidney disease and heart disease. What are tips for following this plan? Lifestyle  Cook and eat meals together with your family, when possible.  Drink enough fluid to keep your urine clear or pale yellow.  Be physically active every day. This includes: ? Aerobic exercise like running or swimming. ? Leisure activities like gardening, walking, or housework.  Get 7-8 hours of sleep each night.  If recommended by your health care provider, drink red wine in moderation. This means 1 glass a day for nonpregnant women and 2 glasses a day for men. A glass of wine equals 5 oz (150 mL). Reading food labels  Check the serving size of packaged foods. For foods such as rice and pasta, the serving size refers to the amount of cooked product, not dry.  Check the total fat in packaged foods. Avoid foods that have saturated fat or trans fats.  Check the ingredients list for added sugars, such as corn syrup.   Shopping  At the grocery store, buy most of your food from the areas near the walls of the store. This includes: ? Fresh fruits and vegetables (produce). ? Grains, beans, nuts, and seeds. Some of these may be available in unpackaged forms or large amounts (in bulk). ? Fresh seafood. ? Poultry and eggs. ? Low-fat dairy products.  Buy whole ingredients instead of prepackaged foods.  Buy fresh fruits and vegetables in-season from local farmers markets.  Buy frozen fruits and vegetables in resealable bags.  If you do not have access to quality fresh seafood, buy precooked frozen shrimp or canned fish, such as tuna, salmon, or sardines.  Buy small amounts of raw or cooked vegetables, salads, or olives from  the deli or salad bar at your store.  Stock your pantry so you always have certain foods on hand, such as olive oil, canned tuna, canned tomatoes, rice, pasta, and beans. Cooking  Cook foods with extra-virgin olive oil instead of using butter or other vegetable oils.  Have meat as a side dish, and have vegetables or grains as your main dish. This means having meat in small portions or adding small amounts of meat to foods like pasta or stew.  Use beans or vegetables instead of meat in common dishes like chili or lasagna.  Experiment with different cooking methods. Try roasting or broiling vegetables instead of steaming or sauteing them.  Add frozen vegetables to soups, stews, pasta, or rice.  Add nuts or seeds for added healthy fat at each meal. You can add these to yogurt, salads, or vegetable dishes.  Marinate fish or vegetables using olive oil, lemon juice, garlic, and fresh herbs. Meal planning  Plan to eat 1 vegetarian meal one day each week. Try to work up to 2 vegetarian meals, if possible.  Eat seafood 2 or more times a week.  Have healthy snacks readily available, such as: ? Vegetable sticks with hummus. ? Greek yogurt. ? Fruit and nut trail mix.  Eat balanced meals throughout the week. This includes: ? Fruit: 2-3 servings a day ? Vegetables: 4-5 servings a day ? Low-fat dairy: 2 servings a day ? Fish, poultry, or lean meat: 1 serving a day ? Beans and legumes: 2 or more servings a week ?   Nuts and seeds: 1-2 servings a day ? Whole grains: 6-8 servings a day ? Extra-virgin olive oil: 3-4 servings a day  Limit red meat and sweets to only a few servings a month   What are my food choices?  Mediterranean diet ? Recommended  Grains: Whole-grain pasta. Brown rice. Bulgar wheat. Polenta. Couscous. Whole-wheat bread. Oatmeal. Quinoa.  Vegetables: Artichokes. Beets. Broccoli. Cabbage. Carrots. Eggplant. Green beans. Chard. Kale. Spinach. Onions. Leeks. Peas. Squash.  Tomatoes. Peppers. Radishes.  Fruits: Apples. Apricots. Avocado. Berries. Bananas. Cherries. Dates. Figs. Grapes. Lemons. Melon. Oranges. Peaches. Plums. Pomegranate.  Meats and other protein foods: Beans. Almonds. Sunflower seeds. Pine nuts. Peanuts. Cod. Salmon. Scallops. Shrimp. Tuna. Tilapia. Clams. Oysters. Eggs.  Dairy: Low-fat milk. Cheese. Greek yogurt.  Beverages: Water. Red wine. Herbal tea.  Fats and oils: Extra virgin olive oil. Avocado oil. Grape seed oil.  Sweets and desserts: Greek yogurt with honey. Baked apples. Poached pears. Trail mix.  Seasoning and other foods: Basil. Cilantro. Coriander. Cumin. Mint. Parsley. Sage. Rosemary. Tarragon. Garlic. Oregano. Thyme. Pepper. Balsalmic vinegar. Tahini. Hummus. Tomato sauce. Olives. Mushrooms. ? Limit these  Grains: Prepackaged pasta or rice dishes. Prepackaged cereal with added sugar.  Vegetables: Deep fried potatoes (french fries).  Fruits: Fruit canned in syrup.  Meats and other protein foods: Beef. Pork. Lamb. Poultry with skin. Hot dogs. Bacon.  Dairy: Ice cream. Sour cream. Whole milk.  Beverages: Juice. Sugar-sweetened soft drinks. Beer. Liquor and spirits.  Fats and oils: Butter. Canola oil. Vegetable oil. Beef fat (tallow). Lard.  Sweets and desserts: Cookies. Cakes. Pies. Candy.  Seasoning and other foods: Mayonnaise. Premade sauces and marinades. The items listed may not be a complete list. Talk with your dietitian about what dietary choices are right for you. Summary  The Mediterranean diet includes both food and lifestyle choices.  Eat a variety of fresh fruits and vegetables, beans, nuts, seeds, and whole grains.  Limit the amount of red meat and sweets that you eat.  Talk with your health care provider about whether it is safe for you to drink red wine in moderation. This means 1 glass a day for nonpregnant women and 2 glasses a day for men. A glass of wine equals 5 oz (150 mL). This information  is not intended to replace advice given to you by your health care provider. Make sure you discuss any questions you have with your health care provider. Document Revised: 10/16/2015 Document Reviewed: 10/09/2015 Elsevier Patient Education  2020 [employer]  

## 2020-09-12 LAB — CBC
Hematocrit: 42.7 % (ref 37.5–51.0)
Hemoglobin: 14.5 g/dL (ref 13.0–17.7)
MCH: 29.8 pg (ref 26.6–33.0)
MCHC: 34 g/dL (ref 31.5–35.7)
MCV: 88 fL (ref 79–97)
Platelets: 144 10*3/uL — ABNORMAL LOW (ref 150–450)
RBC: 4.87 x10E6/uL (ref 4.14–5.80)
RDW: 12.9 % (ref 11.6–15.4)
WBC: 5.8 10*3/uL (ref 3.4–10.8)

## 2020-09-12 LAB — CMP14+EGFR
ALT: 18 IU/L (ref 0–44)
AST: 19 IU/L (ref 0–40)
Albumin/Globulin Ratio: 1.8 (ref 1.2–2.2)
Albumin: 4.4 g/dL (ref 3.7–4.7)
Alkaline Phosphatase: 84 IU/L (ref 44–121)
BUN/Creatinine Ratio: 13 (ref 10–24)
BUN: 13 mg/dL (ref 8–27)
Bilirubin Total: 0.5 mg/dL (ref 0.0–1.2)
CO2: 26 mmol/L (ref 20–29)
Calcium: 8.6 mg/dL (ref 8.6–10.2)
Chloride: 101 mmol/L (ref 96–106)
Creatinine, Ser: 1.04 mg/dL (ref 0.76–1.27)
Globulin, Total: 2.5 g/dL (ref 1.5–4.5)
Glucose: 82 mg/dL (ref 65–99)
Potassium: 4.1 mmol/L (ref 3.5–5.2)
Sodium: 140 mmol/L (ref 134–144)
Total Protein: 6.9 g/dL (ref 6.0–8.5)
eGFR: 73 mL/min/{1.73_m2} (ref >59)

## 2020-09-12 LAB — LIPID PANEL
Chol/HDL Ratio: 3.3 ratio (ref 0.0–5.0)
Cholesterol, Total: 126 mg/dL (ref 100–199)
HDL: 38 mg/dL — ABNORMAL LOW (ref >39)
LDL Chol Calc (NIH): 71 mg/dL (ref 0–99)
Triglycerides: 88 mg/dL (ref 0–149)
VLDL Cholesterol Cal: 17 mg/dL (ref 5–40)

## 2020-09-15 ENCOUNTER — Ambulatory Visit (INDEPENDENT_AMBULATORY_CARE_PROVIDER_SITE_OTHER): Payer: Medicare Other

## 2020-09-15 ENCOUNTER — Telehealth: Payer: Medicare Other

## 2020-09-15 DIAGNOSIS — I10 Essential (primary) hypertension: Secondary | ICD-10-CM

## 2020-09-15 DIAGNOSIS — I119 Hypertensive heart disease without heart failure: Secondary | ICD-10-CM | POA: Diagnosis not present

## 2020-09-15 DIAGNOSIS — I25118 Atherosclerotic heart disease of native coronary artery with other forms of angina pectoris: Secondary | ICD-10-CM

## 2020-09-15 DIAGNOSIS — E7800 Pure hypercholesterolemia, unspecified: Secondary | ICD-10-CM

## 2020-09-16 NOTE — Chronic Care Management (AMB) (Signed)
Chronic Care Management   CCM RN Visit Note  09/15/2020 Name: KENICHI CASSADA MRN: [id number] DOB: 1940/12/11  Subjective: GRAYSYN BACHE is a 80 y.o. year old male who is a primary care patient of [name], MD. The care management team was consulted for assistance with disease management and care coordination needs.    Engaged with patient by telephone for follow up visit in response to provider referral for case management and/or care coordination services.   Consent to Services:  The patient was given information about Chronic Care Management services, agreed to services, and gave verbal consent prior to initiation of services.  Please see initial visit note for detailed documentation.   Patient agreed to services and verbal consent obtained.   Assessment: Review of patient past medical history, allergies, medications, health status, including review of consultants reports, laboratory and other test data, was performed as part of comprehensive evaluation and provision of chronic care management services.   SDOH (Social Determinants of Health) assessments and interventions performed:  Yes, no acute challenges   CCM Care Plan  Allergies  Allergen Reactions   Other Hives    SEAFOOD   Penicillins Other (See Comments)    "caused me to pass out"   Sulfa Antibiotics Hives    Outpatient Encounter Medications as of 09/15/2020  Medication Sig   amLODipine (NORVASC) 10 MG tablet Take 1 tablet (10 mg total) by mouth daily.   atorvastatin (LIPITOR) 40 MG tablet TAKE 1 TABLET(40 MG) BY MOUTH DAILY   Cholecalciferol (VITAMIN D) 2000 units CAPS Take 2,000 Units by mouth daily.   clopidogrel (PLAVIX) 75 MG tablet Take 1 tablet (75 mg total) by mouth daily.   EPINEPHrine (EPIPEN 2-PAK) 0.3 mg/0.3 mL IJ SOAJ injection Inject 0.3 mLs (0.3 mg total) into the muscle as needed for anaphylaxis.   hydrochlorothiazide (MICROZIDE) 12.5 MG capsule TAKE 1 CAPSULE(12.5 MG) BY MOUTH DAILY   isosorbide  mononitrate (IMDUR) 30 MG 24 hr tablet Take 1 tablet (30 mg total) by mouth daily.   lisinopril (ZESTRIL) 40 MG tablet TAKE 1 TABLET BY MOUTH DAILY   magnesium oxide (MAG-OX) 400 MG tablet Take 400 mg by mouth daily.   metoprolol tartrate (LOPRESSOR) 50 MG tablet TAKE 1 TABLET(50 MG) BY MOUTH TWICE DAILY   nitroGLYCERIN (NITROSTAT) 0.4 MG SL tablet Place 1 tablet (0.4 mg total) under the tongue every 5 (five) minutes as needed for chest pain.   No facility-administered encounter medications on file as of 09/15/2020.    Patient Active Problem List   Diagnosis Date Noted   Chest pain with moderate risk for cardiac etiology 03/25/2020   Right carotid bruit 03/25/2020   History of CVA (cerebrovascular accident) 04/02/2016   Back pain, chronic 07/26/2012   Right leg weakness 07/26/2012   Migraine variant 07/26/2012   Stroke-like symptoms 07/26/2012   Sinus bradycardia 06/30/2012   CAD S/P percutaneous coronary angioplasty 06/30/2012   Dyslipidemia 06/30/2012   Hypokalemia 06/30/2012   Obstructive sleep apnea 05/05/2007   Essential hypertension 05/05/2007   ALLERGIC RHINITIS 05/05/2007   History of prostate cancer 05/05/2007    Conditions to be addressed/monitored: Essential hypertension, Pure hypercholesteremia, Atherosclerosis of native coronary artery of native heart with other form of angina pectoris, Hypertensive Heart disease without heart failure   Care Plan : Hypertensive Heart disease  Updates made by Lynne Logan, RN since 09/15/2020 12:00 AM     Problem: Hypertension   Priority: High     Long-Range Goal: Disease Progression Prevented  or Minimized   Start Date: 04/28/2020  Expected End Date: 04/28/2021  Recent Progress: On track  Priority: High  Note:   Objective:  Last practice recorded BP readings:  BP Readings from Last 3 Encounters:  09/11/20 124/72  09/11/20 124/72  07/01/20 (!) 148/69   Most recent eGFR/CrCl:  Lab Results  Component Value Date   EGFR 73  09/11/2020    No components found for: CRCL Current Barriers:  Knowledge Deficits related to basic understanding of hypertension pathophysiology and self care management Knowledge Deficits related to understanding of medications prescribed for management of hypertension Case Manager Clinical Goal(s):  patient will demonstrate improved adherence to prescribed treatment plan for hypertension as evidenced by taking all medications as prescribed, monitoring and recording blood pressure as directed, adhering to low sodium/DASH diet Interventions:  09/15/20 completed successful outbound call with patient  Collaboration with [name], MD regarding development and update of comprehensive plan of care as evidenced by provider attestation and co-signature Inter-disciplinary care team collaboration (see longitudinal plan of care) Provided education to patient about basic disease process related to Hypertension  Review of patient status, including review of consultant's reports, relevant laboratory and other test results, and medications completed. Educated patient on dietary and exercise recommendations Educated on the importance of attending all scheduled provider appointments, check blood pressure at home and record as discussed, adhere to a low sodium, heart healthy diet Mailed printed educational material to patient related to Why Should I Restrict Low Sodium?; 11 Foods to help improve your HDL Reviewed medications with patient and discussed importance of medication adherence Self-Care Activities: Self administers medications as prescribed Attends all scheduled provider appointments Calls provider office for new concerns, questions, or BP outside discussed parameters Checks BP and records as discussed Follows a low sodium diet/DASH diet Patient Goals: - check blood pressure daily - write blood pressure results in a log or diary - learn about high blood pressure  Follow Up Plan: Telephone  follow up appointment with care management team member scheduled for: 10/16/20     Plan:Telephone follow up appointment with care management team member scheduled for:  10/16/20  Barb Merino, RN, BSN, CCM Care Management Coordinator [facility]  Direct Phone: [phone]

## 2020-09-16 NOTE — Patient Instructions (Signed)
Goals Addressed      Timeframe:  Long-Range Goal Priority:  High Start Date:  04/28/20                          Expected End Date: 04/28/21                    Follow Up Date: 10/16/20  Self-Care Activities: Self administers medications as prescribed Attends all scheduled provider appointments Calls provider office for new concerns, questions, or BP outside discussed parameters Checks BP and records as discussed Follows a low sodium diet/DASH diet Patient Goals: - check blood pressure daily - write blood pressure results in a log or diary - learn about high blood pressure   Why is this important?   Lifestyle changes are key to improving the blood flow to your heart. Think about the things you can change and set a goal to live healthy.  Remember, when the blood vessels to your heart start to get clogged you may not have any symptoms.  Over time, they can get worse.  Don't ignore the signs, like chest pain, and get help right away.     Notes:

## 2020-09-25 DIAGNOSIS — M222X2 Patellofemoral disorders, left knee: Secondary | ICD-10-CM | POA: Diagnosis not present

## 2020-09-26 DIAGNOSIS — M2242 Chondromalacia patellae, left knee: Secondary | ICD-10-CM | POA: Diagnosis not present

## 2020-10-01 DIAGNOSIS — M2242 Chondromalacia patellae, left knee: Secondary | ICD-10-CM | POA: Diagnosis not present

## 2020-10-03 DIAGNOSIS — M2242 Chondromalacia patellae, left knee: Secondary | ICD-10-CM | POA: Diagnosis not present

## 2020-10-07 DIAGNOSIS — M2242 Chondromalacia patellae, left knee: Secondary | ICD-10-CM | POA: Diagnosis not present

## 2020-10-09 DIAGNOSIS — M2242 Chondromalacia patellae, left knee: Secondary | ICD-10-CM | POA: Diagnosis not present

## 2020-10-14 DIAGNOSIS — M2242 Chondromalacia patellae, left knee: Secondary | ICD-10-CM | POA: Diagnosis not present

## 2020-10-16 ENCOUNTER — Telehealth: Payer: Medicare Other

## 2020-10-16 DIAGNOSIS — M2242 Chondromalacia patellae, left knee: Secondary | ICD-10-CM | POA: Diagnosis not present

## 2020-10-21 DIAGNOSIS — M2242 Chondromalacia patellae, left knee: Secondary | ICD-10-CM | POA: Diagnosis not present

## 2020-10-24 DIAGNOSIS — M2242 Chondromalacia patellae, left knee: Secondary | ICD-10-CM | POA: Diagnosis not present

## 2020-11-11 ENCOUNTER — Ambulatory Visit (INDEPENDENT_AMBULATORY_CARE_PROVIDER_SITE_OTHER): Payer: Medicare Other | Admitting: Nurse Practitioner

## 2020-11-11 ENCOUNTER — Encounter: Payer: Self-pay | Admitting: Nurse Practitioner

## 2020-11-11 ENCOUNTER — Other Ambulatory Visit: Payer: Self-pay

## 2020-11-11 VITALS — BP 126/70 | HR 72 | Temp 98.4°F | Ht 68.6 in | Wt 208.0 lb

## 2020-11-11 DIAGNOSIS — R21 Rash and other nonspecific skin eruption: Secondary | ICD-10-CM

## 2020-11-11 MED ORDER — PREDNISONE 10 MG (21) PO TBPK: ORAL_TABLET | ORAL | 0 refills | Status: DC

## 2020-11-11 NOTE — Patient Instructions (Signed)
Rash, Adult  A rash is a change in the color of your skin. A rash can also change the way your skin feels. There are many different conditions and factors that can causea rash. Follow these instructions at home: The goal of treatment is to stop the itching and keep the rash from spreading. Watch for any changes in your symptoms. Let your doctor know about them. Followthese instructions to help with your condition: Medicine Take or apply over-the-counter and prescription medicines only as told by your doctor. These may include medicines: To treat red or swollen skin (corticosteroid creams). To treat itching. To treat an allergy (oral antihistamines). To treat very bad symptoms (oral corticosteroids).  Skin care Put cool cloths (compresses) on the affected areas. Do not scratch or rub your skin. Avoid covering the rash. Make sure that the rash is exposed to air as much as possible. Managing itching and discomfort Avoid hot showers or baths. These can make itching worse. A cold shower may help. Try taking a bath with: Epsom salts. You can get these at your local pharmacy or grocery store. Follow the instructions on the package. Baking soda. Pour a small amount into the bath as told by your doctor. Colloidal oatmeal. You can get this at your local pharmacy or grocery store. Follow the instructions on the package. Try putting baking soda paste onto your skin. Stir water into baking soda until it gets like a paste. Try putting on a lotion that relieves itchiness (calamine lotion). Keep cool and out of the sun. Sweating and being hot can make itching worse. General instructions  Rest as needed. Drink enough fluid to keep your pee (urine) pale yellow. Wear loose-fitting clothing. Avoid scented soaps, detergents, and perfumes. Use gentle soaps, detergents, perfumes, and other cosmetic products. Avoid anything that causes your rash. Keep a journal to help track what causes your rash. Write  down: What you eat. What cosmetic products you use. What you drink. What you wear. This includes jewelry. Keep all follow-up visits as told by your doctor. This is important.  Contact a doctor if: You sweat at night. You lose weight. You pee (urinate) more than normal. You pee less than normal, or you notice that your pee is a darker color than normal. You feel weak. You throw up (vomit). Your skin or the whites of your eyes look yellow (jaundice). Your skin: Tingles. Is numb. Your rash: Does not go away after a few days. Gets worse. You are: More thirsty than normal. More tired than normal. You have: New symptoms. Pain in your belly (abdomen). A fever. Watery poop (diarrhea). Get help right away if: You have a fever and your symptoms suddenly get worse. You start to feel mixed up (confused). You have a very bad headache or a stiff neck. You have very bad joint pains or stiffness. You have jerky movements that you cannot control (seizure). Your rash covers all or most of your body. The rash may or may not be painful. You have blisters that: Are on top of the rash. Grow larger. Grow together. Are painful. Are inside your nose or mouth. You have a rash that: Looks like purple pinprick-sized spots all over your body. Has a "bull's eye" or looks like a target. Is red and painful, causes your skin to peel, and is not from being in the sun too long. Summary A rash is a change in the color of your skin. A rash can also change the way your skin feels.   The goal of treatment is to stop the itching and keep the rash from spreading. Take or apply over-the-counter and prescription medicines only as told by your doctor. Contact a doctor if you have new symptoms or symptoms that get worse. Keep all follow-up visits as told by your doctor. This is important. This information is not intended to replace advice given to you by your health care provider. Make sure you discuss any  questions you have with your healthcare provider. Document Revised: 06/09/2018 Document Reviewed: 09/19/2017 Elsevier Patient Education  2022 Elsevier Inc.  

## 2020-11-11 NOTE — Progress Notes (Signed)
I,Eugene Murray,acting as a Education administrator for [employer], NP.,have documented all relevant documentation on the behalf of [employer], NP,as directed by  Eugene Castilla, NP while in the presence of Eugene Castilla, NP.  This visit occurred during the SARS-CoV-2 public health emergency.  Safety protocols were in place, including screening questions prior to the visit, additional usage of staff PPE, and extensive cleaning of exam room while observing appropriate contact time as indicated for disinfecting solutions.  Subjective:     Patient ID: Eugene Murray , male    DOB: Jan 24, 1941 , 80 y.o.   MRN: [id number]   Chief Complaint  Patient presents with   Rash    HPI  Patient is here today for rash. He states that he went to his moms [place] this weekend and the grass was not cut. He believes it is chiggers. He has been outside lately and the bites/rash started after that.   Rash Pertinent negatives include no congestion, cough, diarrhea, fatigue, fever or rhinorrhea.    Past Medical History:  Diagnosis Date   Allergic rhinitis    Cancer ([place])    Prostate cancer   Coronary artery disease    History of prostate cancer    Hyperlipidemia    Hypertension    Hypokalemia    Nephritis    as a child   OSA (obstructive sleep apnea)    cpap machine at home   Stroke [facility])    "mini stroke" TIA     Family History  Problem Relation Age of Onset   Colon cancer Father        possibly age 70 y.o diagnosed    Stroke Mother    Asthma Sister      Current Outpatient Medications:    predniSONE (STERAPRED UNI-PAK 21 TAB) 10 MG (21) TBPK tablet, Use as directed, Disp: 21 each, Rfl: 0   amLODipine (NORVASC) 10 MG tablet, Take 1 tablet (10 mg total) by mouth daily., Disp: 90 tablet, Rfl: 3   atorvastatin (LIPITOR) 40 MG tablet, TAKE 1 TABLET(40 MG) BY MOUTH DAILY, Disp: 30 tablet, Rfl: 10   Cholecalciferol (VITAMIN D) 2000 units CAPS, Take 2,000 Units by mouth daily., Disp: , Rfl:     clopidogrel (PLAVIX) 75 MG tablet, Take 1 tablet (75 mg total) by mouth daily., Disp: 90 tablet, Rfl: 3   EPINEPHrine (EPIPEN 2-PAK) 0.3 mg/0.3 mL IJ SOAJ injection, Inject 0.3 mLs (0.3 mg total) into the muscle as needed for anaphylaxis., Disp: 2 each, Rfl: 1   hydrochlorothiazide (MICROZIDE) 12.5 MG capsule, TAKE 1 CAPSULE(12.5 MG) BY MOUTH DAILY, Disp: 90 capsule, Rfl: 2   isosorbide mononitrate (IMDUR) 30 MG 24 hr tablet, Take 1 tablet (30 mg total) by mouth daily., Disp: 90 tablet, Rfl: 3   lisinopril (ZESTRIL) 40 MG tablet, TAKE 1 TABLET BY MOUTH DAILY, Disp: 90 tablet, Rfl: 3   magnesium oxide (MAG-OX) 400 MG tablet, Take 400 mg by mouth daily., Disp: , Rfl:    metoprolol tartrate (LOPRESSOR) 50 MG tablet, TAKE 1 TABLET(50 MG) BY MOUTH TWICE DAILY, Disp: 180 tablet, Rfl: 3   nitroGLYCERIN (NITROSTAT) 0.4 MG SL tablet, Place 1 tablet (0.4 mg total) under the tongue every 5 (five) minutes as needed for chest pain., Disp: 25 tablet, Rfl: 2   Allergies  Allergen Reactions   Other Hives    SEAFOOD   Penicillins Other (See Comments)    "caused me to pass out"   Sulfa Antibiotics Hives     Review of Systems  Constitutional: Negative.  Negative for chills, fatigue and fever.  HENT:  Negative for congestion, rhinorrhea and sinus pain.   Respiratory: Negative.  Negative for cough and wheezing.   Cardiovascular: Negative.  Negative for chest pain and palpitations.  Gastrointestinal: Negative.  Negative for constipation and diarrhea.  Skin:  Positive for rash.  Neurological: Negative.  Negative for dizziness and numbness.    Today's Vitals   11/11/20 1435  BP: 126/70  Pulse: 72  Temp: 98.4 F (36.9 C)  TempSrc: Oral  Weight: 208 lb (94.3 kg)  Height: 5' 8.6" (1.742 m)   Body mass index is 31.08 kg/m.  Wt Readings from Last 3 Encounters:  11/11/20 208 lb (94.3 kg)  09/11/20 209 lb (94.8 kg)  09/11/20 209 lb (94.8 kg)    Objective:  Physical Exam Constitutional:       Appearance: Normal appearance.  HENT:     Head: Normocephalic and atraumatic.  Cardiovascular:     Rate and Rhythm: Normal rate and regular rhythm.     Pulses: Normal pulses.     Heart sounds: Normal heart sounds. No murmur heard. Pulmonary:     Effort: Pulmonary effort is normal. No respiratory distress.     Breath sounds: Normal breath sounds. No wheezing.  Skin:    General: Skin is warm and dry.     Capillary Refill: Capillary refill takes less than 2 seconds.     Coloration: Skin is not ashen.     Findings: Rash present. No bruising. Rash is papular. Rash is not crusting or pustular.     Comments: Rash to to his lower legs and arms   Neurological:     Mental Status: He is alert and oriented to person, place, and time.        Assessment And Plan:     1. Rash - predniSONE (STERAPRED UNI-PAK 21 TAB) 10 MG (21) TBPK tablet; Use as directed  Dispense: 21 each; Refill: 0  -Advised patient to use OTC Benadryl and Pepcid as needed for itching.  -He can also use OTC hydrocortisone cream on areas  -Advised him to wash his clothes with warm water.  -Advised patient if rash get worse or symptoms get worse to call us or send [place] a message.   The patient was encouraged to call or send a message through [place] for any questions or concerns.   The patient was encouraged to call or send a message through [place] for any questions or concerns.   Side effects and appropriate use of all the medication(s) were discussed with the patient today. Patient advised to use the medication(s) as directed by their healthcare provider. The patient was encouraged to read, review, and understand all associated package inserts and contact our office with any questions or concerns. The patient accepts the risks of the treatment plan and had an opportunity to ask questions.   Patient was given opportunity to ask questions. Patient verbalized understanding of the plan and was able to repeat key elements of the  plan. All questions were answered to their satisfaction.  Eugene Juniper Snyders, DNP   I, Eugene Murray have reviewed all documentation for this visit. The documentation on 11/11/20 for the exam, diagnosis, procedures, and orders are all accurate and complete.    IF YOU HAVE BEEN REFERRED TO A SPECIALIST, IT MAY TAKE 1-2 WEEKS TO SCHEDULE/PROCESS THE REFERRAL. IF YOU HAVE NOT HEARD FROM US/SPECIALIST IN TWO WEEKS, PLEASE GIVE [place] A CALL AT [phone].   THE  PATIENT IS ENCOURAGED TO PRACTICE SOCIAL DISTANCING DUE TO THE COVID-19 PANDEMIC.

## 2020-11-13 ENCOUNTER — Other Ambulatory Visit: Payer: Self-pay | Admitting: Cardiovascular Disease

## 2020-11-19 ENCOUNTER — Ambulatory Visit (INDEPENDENT_AMBULATORY_CARE_PROVIDER_SITE_OTHER): Payer: Medicare Other

## 2020-11-19 ENCOUNTER — Telehealth: Payer: Medicare Other

## 2020-11-19 DIAGNOSIS — I25118 Atherosclerotic heart disease of native coronary artery with other forms of angina pectoris: Secondary | ICD-10-CM

## 2020-11-19 DIAGNOSIS — M25562 Pain in left knee: Secondary | ICD-10-CM

## 2020-11-19 DIAGNOSIS — E7800 Pure hypercholesterolemia, unspecified: Secondary | ICD-10-CM

## 2020-11-19 DIAGNOSIS — G8929 Other chronic pain: Secondary | ICD-10-CM

## 2020-11-19 DIAGNOSIS — I119 Hypertensive heart disease without heart failure: Secondary | ICD-10-CM

## 2020-11-19 DIAGNOSIS — I10 Essential (primary) hypertension: Secondary | ICD-10-CM

## 2020-11-19 NOTE — Chronic Care Management (AMB) (Signed)
Chronic Care Management   CCM RN Visit Note  11/19/2020 Name: Eugene Murray MRN: [id number] DOB: January 19, 1941  Subjective: Eugene Murray is a 80 y.o. year old male who is a primary care patient of [name], MD. The care management team was consulted for assistance with disease management and care coordination needs.    Engaged with patient by telephone for follow up visit in response to provider referral for case management and/or care coordination services.   Consent to Services:  The patient was given information about Chronic Care Management services, agreed to services, and gave verbal consent prior to initiation of services.  Please see initial visit note for detailed documentation.   Patient agreed to services and verbal consent obtained.   Assessment: Review of patient past medical history, allergies, medications, health status, including review of consultants reports, laboratory and other test data, was performed as part of comprehensive evaluation and provision of chronic care management services.   SDOH (Social Determinants of Health) assessments and interventions performed:    CCM Care Plan  Allergies  Allergen Reactions   Other Hives    SEAFOOD   Penicillins Other (See Comments)    "caused me to pass out"   Sulfa Antibiotics Hives    Outpatient Encounter Medications as of 11/19/2020  Medication Sig   amLODipine (NORVASC) 10 MG tablet Take 1 tablet (10 mg total) by mouth daily.   atorvastatin (LIPITOR) 40 MG tablet TAKE 1 TABLET(40 MG) BY MOUTH DAILY   Cholecalciferol (VITAMIN D) 2000 units CAPS Take 2,000 Units by mouth daily.   clopidogrel (PLAVIX) 75 MG tablet Take 1 tablet (75 mg total) by mouth daily.   EPINEPHrine (EPIPEN 2-PAK) 0.3 mg/0.3 mL IJ SOAJ injection Inject 0.3 mLs (0.3 mg total) into the muscle as needed for anaphylaxis.   hydrochlorothiazide (MICROZIDE) 12.5 MG capsule TAKE 1 CAPSULE(12.5 MG) BY MOUTH DAILY   isosorbide mononitrate (IMDUR) 30 MG  24 hr tablet Take 1 tablet (30 mg total) by mouth daily.   lisinopril (ZESTRIL) 40 MG tablet TAKE 1 TABLET BY MOUTH DAILY   magnesium oxide (MAG-OX) 400 MG tablet Take 400 mg by mouth daily.   metoprolol tartrate (LOPRESSOR) 50 MG tablet TAKE 1 TABLET(50 MG) BY MOUTH TWICE DAILY   nitroGLYCERIN (NITROSTAT) 0.4 MG SL tablet Place 1 tablet (0.4 mg total) under the tongue every 5 (five) minutes as needed for chest pain.   predniSONE (STERAPRED UNI-PAK 21 TAB) 10 MG (21) TBPK tablet Use as directed   No facility-administered encounter medications on file as of 11/19/2020.    Patient Active Problem List   Diagnosis Date Noted   Chest pain with moderate risk for cardiac etiology 03/25/2020   Right carotid bruit 03/25/2020   History of CVA (cerebrovascular accident) 04/02/2016   Back pain, chronic 07/26/2012   Right leg weakness 07/26/2012   Migraine variant 07/26/2012   Stroke-like symptoms 07/26/2012   Sinus bradycardia 06/30/2012   CAD S/P percutaneous coronary angioplasty 06/30/2012   Dyslipidemia 06/30/2012   Hypokalemia 06/30/2012   Obstructive sleep apnea 05/05/2007   Essential hypertension 05/05/2007   ALLERGIC RHINITIS 05/05/2007   History of prostate cancer 05/05/2007    Conditions to be addressed/monitored: Essential hypertension, Pure hypercholesteremia, Atherosclerosis of native coronary artery of native heart with other form of angina pectoris, Hypertensive Heart disease without heart failure, Chronic pain of left pain   Care Plan : Hypertensive Heart disease  Updates made by Lynne Logan, RN since 11/19/2020 12:00 AM  Problem: Hypertension   Priority: High     Long-Range Goal: Disease Progression Prevented or Minimized   Start Date: 04/28/2020  Expected End Date: 04/28/2021  Recent Progress: On track  Priority: High  Note:   Objective:  Last practice recorded BP readings:  BP Readings from Last 3 Encounters:  11/11/20 126/70  09/11/20 124/72  09/11/20  124/72   Most recent eGFR/CrCl:  Lab Results  Component Value Date   EGFR 73 09/11/2020    No components found for: CRCL Current Barriers:  Knowledge Deficits related to basic understanding of hypertension pathophysiology and self care management Knowledge Deficits related to understanding of medications prescribed for management of hypertension Case Manager Clinical Goal(s):  patient will demonstrate improved adherence to prescribed treatment plan for hypertension as evidenced by taking all medications as prescribed, monitoring and recording blood pressure as directed, adhering to low sodium/DASH diet Interventions:  11/19/20 completed successful outbound call with patient  Collaboration with [name], MD regarding development and update of comprehensive plan of care as evidenced by provider attestation and co-signature Inter-disciplinary care team collaboration (see longitudinal plan of care) Provided education to patient about basic disease process related to Hypertension  Review of patient status, including review of consultant's reports, relevant laboratory and other test results, and medications completed. Educated patient on dietary and exercise recommendations Educated on the importance of attending all scheduled provider appointments, check blood pressure at home and record as discussed, adhere to a low sodium, heart healthy diet Determined patient does not feel his home BP is working properly, instructed patient to change out his batteries and encouraged him to take his BP cuff by the pharmacy and or PCP office to have checked for accuracy Instructed patient on how to properly check his BP at home, instructed to check 3 times weekly and record readings; Educated on acceptable parameters  Reviewed medications with patient and discussed importance of medication adherence Self-Care Activities: Self administers medications as prescribed Attends all scheduled provider  appointments Calls provider office for new concerns, questions, or BP outside discussed parameters Checks BP and records as discussed Follows a low sodium diet/DASH diet Patient Goals: - check blood pressure 3 times weekly using the technique discussed  - write blood pressure results in a log or diary - learn about high blood pressure - change your batteries in your BP cuff - take BP cuff to your pharmacy and or PCP office to have it checked for accuracy  - continue to adhere to a low Sodium diet   Follow Up Plan: Telephone follow up appointment with care management team member scheduled for: 02/18/21    Care Plan : Wellness (Adult)  Updates made by Lynne Logan, RN since 11/19/2020 12:00 AM     Problem: Musculoskeletal pain (left knee & lower back)   Priority: High     Long-Range Goal: Maintain Independence and Functional Ability to perform Self Care   Start Date: 11/19/2020  Expected End Date: 11/19/2021  This Visit's Progress: On track  Priority: High  Note:   Current Barriers:  Ineffective Self Health Maintenance in a patient with Essential hypertension, Pure hypercholesteremia, Atherosclerosis of native coronary artery of native heart with other form of angina pectoris, Hypertensive Heart disease without heart failure, Chronic pain of left pain  Clinical Goal(s):  Collaboration with [name], MD regarding development and update of comprehensive plan of care as evidenced by provider attestation and co-signature Inter-disciplinary care team collaboration (see longitudinal plan of care) patient will work with  care management team to address care coordination and chronic disease management needs related to Disease Management Educational Needs Care Coordination Medication Management and Education Medication Reconciliation Psychosocial Support   Interventions:  11/19/20 completed successful outbound call with patient  Evaluation of current treatment plan related to   Musculoskeletal pain  , self-management and patient's adherence to plan as established by provider. Collaboration with [name], MD regarding development and update of comprehensive plan of care as evidenced by provider attestation       and co-signature Inter-disciplinary care team collaboration (see longitudinal plan of care) Discussed patient experiencing intermittent low back pain when he reaches upward, he feels this has improved since taking Prednisone for a recent rash Discussed patient recently completed outpatient PT for his left knee, he reported his low back pain and was also instructed on how to stretch/exercise to help manage this pain  Instructed patient to continue his HEP as instructed an to keep his doctor informed of persistent and or worsening symptoms Mailed printed educational materials related to Knee exercises; Hip and Knee exercises   Discussed plans with patient for ongoing care management follow up and provided patient with direct contact information for care management team Self Care Activities:  Self administers medications as prescribed Attends all scheduled provider appointments Calls pharmacy for medication refills Calls provider office for new concerns or questions Patient Goals: - continue to use stretching and home exercise as directed to help manage left knee and back pain  Follow Up Plan: Telephone follow up appointment with care management team member scheduled for: 02/18/21     Plan:Telephone follow up appointment with care management team member scheduled for:  02/18/21  Barb Merino, RN, BSN, CCM Care Management Coordinator [facility]  Direct Phone: [phone]

## 2020-11-19 NOTE — Patient Instructions (Signed)
Visit Information  PATIENT GOALS:  Goals Addressed      Improve My Heart Health   On track    Timeframe:  Long-Range Goal Priority:  High Start Date:  04/28/20                          Expected End Date: 04/28/21                    Follow Up Date: 02/18/21  Patient Goals: - check blood pressure 3 times weekly using the technique discussed  - write blood pressure results in a log or diary - learn about high blood pressure - change your batteries in your BP cuff - take BP cuff to your pharmacy and or PCP office to have it checked for accuracy  - continue to adhere to a low Sodium diet    Why is this important?   Lifestyle changes are key to improving the blood flow to your heart. Think about the things you can change and set a goal to live healthy.  Remember, when the blood vessels to your heart start to get clogged you may not have any symptoms.  Over time, they can get worse.  Don't ignore the signs, like chest pain, and get help right away.     Notes:      Maintain Independence and Functional Ability to Perfor Self Care   On track    Timeframe:  Long-Range Goal Priority:  High Start Date:  11/19/20                           Expected End Date: 11/19/21  Next Scheduled Follow up call: 02/18/21    Patient Goals: - continue to use stretching and home exercise as directed to help manage left knee and back pain                           The patient verbalized understanding of instructions, educational materials, and care plan provided today and declined offer to receive copy of patient instructions, educational materials, and care plan.   Telephone follow up appointment with care management team member scheduled for: 02/18/21  Barb Merino, RN, BSN, CCM Care Management Coordinator [facility]  Direct Phone: [phone]

## 2020-11-28 DIAGNOSIS — E7800 Pure hypercholesterolemia, unspecified: Secondary | ICD-10-CM

## 2020-11-28 DIAGNOSIS — I10 Essential (primary) hypertension: Secondary | ICD-10-CM | POA: Diagnosis not present

## 2020-11-28 DIAGNOSIS — I25118 Atherosclerotic heart disease of native coronary artery with other forms of angina pectoris: Secondary | ICD-10-CM | POA: Diagnosis not present

## 2020-11-28 DIAGNOSIS — I119 Hypertensive heart disease without heart failure: Secondary | ICD-10-CM

## 2021-01-26 ENCOUNTER — Other Ambulatory Visit: Payer: Self-pay

## 2021-01-26 ENCOUNTER — Encounter: Payer: Self-pay | Admitting: Cardiovascular Disease

## 2021-01-26 ENCOUNTER — Ambulatory Visit (INDEPENDENT_AMBULATORY_CARE_PROVIDER_SITE_OTHER): Payer: Medicare Other | Admitting: Cardiovascular Disease

## 2021-01-26 VITALS — BP 130/80 | HR 57 | Ht 68.6 in | Wt 198.0 lb

## 2021-01-26 DIAGNOSIS — I771 Stricture of artery: Secondary | ICD-10-CM

## 2021-01-26 DIAGNOSIS — I10 Essential (primary) hypertension: Secondary | ICD-10-CM | POA: Diagnosis not present

## 2021-01-26 DIAGNOSIS — Z9861 Coronary angioplasty status: Secondary | ICD-10-CM

## 2021-01-26 DIAGNOSIS — E785 Hyperlipidemia, unspecified: Secondary | ICD-10-CM

## 2021-01-26 DIAGNOSIS — I209 Angina pectoris, unspecified: Secondary | ICD-10-CM

## 2021-01-26 DIAGNOSIS — I2510 Atherosclerotic heart disease of native coronary artery without angina pectoris: Secondary | ICD-10-CM | POA: Diagnosis not present

## 2021-01-26 DIAGNOSIS — G4733 Obstructive sleep apnea (adult) (pediatric): Secondary | ICD-10-CM

## 2021-01-26 NOTE — Progress Notes (Signed)
Patient ID: Eugene Murray, male   DOB: 1940-09-19, 80 y.o.   MRN: [id number]      HPI: Eugene Murray  is a 80 y.o. male presents to the office today for a 6 month follow-up cardiology evaluation.   Eugene Murray  has a history of hypertension, hyperlipidemia, obstructive sleep apnea on CPAP therapy, history of prostate Murray, and CAD.  In November 2008, he underwent initial PCI to his diagonal vessel and RCA. In April 2011 he was found to have a new 95% stenosis beyond is widely patent RCA stent in the distal RCA at which time a 3.5x12 mm Promus DES stent was inserted. His diagonal stent was patent. He had 20% mid LAD stenosis. In June 2013 a perfusion study was normal. In 2014 he experienced symptoms of weakness involving his right arm with slowness of speech and dysarthria. He was seen by the stroke team. A CT of his head did not show acute intracranial abnormality. MRI showed an equivocal tiny acute left pontomedullary junction stroke. He had mild atherosclerotic changes but otherwise unremarkable. He had seen a neurologist and was started on topiramate. He has been stable neurologically.  I saw him in 2016 and over the prior year he remained stable from a cardiovascular standpoint without recurrent chest pain symptomatology.  He goes to the gym several days per week.  He denies any significant episodes of recurrent chest pain.  There have been some short fleeting episodes.  He denies shortness of breath. He denies palpitations. He denies presyncope or syncope. He  has been tolerating atorvastatin 40 mg for his hyperlipidemia.  He denies bleeding with continuation of dual antiplatelet therapy.  He is unaware of any significant blood pressure elevation is current regimen consisting of amlodipine 5 mg, HCTZ 25 mg, lisinopril 40 mg, and metoprolol 50 mg twice a day.    He has obstructive sleep apnea and continues to use CPAP with 100% compliance.  He is unaware of breakthrough snoring.  He denies daytime  sleepiness.    I saw him in 2017 at which time he continued to remain stable.  I last saw him in June 2019 and he denied any recurrent episodes of chest pain, PND orthopnea.  He was exercising at least 3 days/week at the [facility] and was typically walking 1-1/2 to 2 miles at a time.  He continues to use CPAP with 100% compliance.  He had been bitten by a tick and received treatment for.with doxycycline.    Since my prior evaluation, he was seen the office in November 2020 by Eugene Memos, NP and most recently by Eugene Murray in January 2022.  During his January 2022 evaluation he had admitted to some vague chest tightness which seem to occur at onset of walking but then would resolve.  He underwent a nuclear perfusion study on April 10, 2020 which remained low risk and showed normal perfusion without scar or ischemia.  He also underwent carotid studies in February 2022 which were essentially normal in the carotids but the right subclavian artery was stenotic.  I last saw him on Jun 30, 2020 at which time he continued to be active.  If he were to walk fast uphill at onset of walking he does experience a mild tightness which ultimately resolves with continued walking.  He continues to use CPAP with 100% compliance and is followed by Eugene Murray.  He has been on a regimen of amlodipine 10 mg, HCTZ 12.5 mg, lisinopril 40 mg, and metoprolol  50 mg twice a day for hypertension.  He continues to be on atorvastatin 40 mg for hyperlipidemia.  He is on Plavix 75 mg daily for platelet inhibition and is not on aspirin.  During that evaluation, with his class II anginal symptomatology I added isosorbide 30 mg to his medical regimen.   Since I saw him, he has felt well.  He notes improvement in his prior chest tightness with the initiation of isosorbide.  Unfortunately has been under increased stress at home front since his wife suffered a significant stroke in September 2022 and has significant residual deficits.  He  continues to be on amlodipine 10 mg, HCTZ 12.5 mg, lisinopril 40 mg, isosorbide 30 mg and metoprolol tartrate 50 mg twice a day.  He states he has not seen Eugene Murray in a long time.  As result a download was obtained from his CPAP machine which verified excellent compliance with usage days but average usage was only 5 hours and 43 minutes.  AHI was 2.8 and his 95th percentile pressure was 9.1.  Epworth scale was increased at 14 consistent with visual daytime sleepiness.  He presents for evaluation.  Past Medical History:  Diagnosis Date   Allergic rhinitis    Murray ([place])    Prostate Murray   Coronary artery disease    History of prostate Murray    Hyperlipidemia    Hypertension    Hypokalemia    Nephritis    as a child   OSA (obstructive sleep apnea)    cpap machine at home   Stroke [facility])    "mini stroke" TIA    Past Surgical History:  Procedure Laterality Date   CARDIAC CATHETERIZATION     CAROTID STENT  11-08 and 4-11   stent x 3 (12/2006 x 2; 2011 x 1 stent)   CATARACT EXTRACTION W/PHACO Left 08/21/2014   Procedure: PHACO EMULSION CATARACT EXTRACTION AND INTRAOCULAR LENS PLACEMENT ([place]) IMPLANT LEFT EYE;  Surgeon: Eugene Pearson, MD;  Location: [place];  Service: Ophthalmology;  Laterality: Left;   COLONOSCOPY  2015   COLONOSCOPY W/ POLYPECTOMY     CORONARY ANGIOPLASTY     HERNIA REPAIR Bilateral    x 2   different times   PROSTATE SURGERY     had Murray; radical resection 1997     Allergies  Allergen Reactions   Other Hives    SEAFOOD   Penicillins Other (See Comments)    "caused me to pass out"   Sulfa Antibiotics Hives    Current Outpatient Medications  Medication Sig Dispense Refill   amLODipine (NORVASC) 10 MG tablet Take 1 tablet (10 mg total) by mouth daily. 90 tablet 3   atorvastatin (LIPITOR) 40 MG tablet TAKE 1 TABLET(40 MG) BY MOUTH DAILY 30 tablet 10   Cholecalciferol (VITAMIN D) 2000 units CAPS Take 2,000 Units by mouth daily.     clopidogrel (PLAVIX) 75  MG tablet Take 1 tablet (75 mg total) by mouth daily. 90 tablet 3   EPINEPHrine (EPIPEN 2-PAK) 0.3 mg/0.3 mL IJ SOAJ injection Inject 0.3 mLs (0.3 mg total) into the muscle as needed for anaphylaxis. 2 each 1   hydrochlorothiazide (MICROZIDE) 12.5 MG capsule TAKE 1 CAPSULE(12.5 MG) BY MOUTH DAILY 90 capsule 2   isosorbide mononitrate (IMDUR) 30 MG 24 hr tablet Take 1 tablet (30 mg total) by mouth daily. 90 tablet 3   lisinopril (ZESTRIL) 40 MG tablet TAKE 1 TABLET BY MOUTH DAILY 90 tablet 3   magnesium oxide (MAG-OX) 400  MG tablet Take 400 mg by mouth daily.     metoprolol tartrate (LOPRESSOR) 50 MG tablet TAKE 1 TABLET(50 MG) BY MOUTH TWICE DAILY 180 tablet 3   nitroGLYCERIN (NITROSTAT) 0.4 MG SL tablet Place 1 tablet (0.4 mg total) under the tongue every 5 (five) minutes as needed for chest pain. 25 tablet 2   predniSONE (STERAPRED UNI-PAK 21 TAB) 10 MG (21) TBPK tablet Use as directed (Patient not taking: Reported on 01/26/2021) 21 each 0   No current facility-administered medications for this visit.    Socially he is married has 3 children and 2 grandchildren. He does drink occasional alcohol. There is no tobacco use. He does exercise occasionally.  ROS General: Negative; No fevers, chills, or night sweats;  HEENT: Negative; No changes in vision or hearing, sinus congestion, difficulty swallowing Pulmonary: Negative; No cough, wheezing, shortness of breath, hemoptysis Cardiovascular: Negative; No chest pain, presyncope, syncope, palpitations GI: Negative; No nausea, vomiting, diarrhea, or abdominal pain GU: History of prostate CA No dysuria, hematuria, or difficulty voiding Musculoskeletal: History of back discomfort intermittently; no myalgias, joint pain, or weakness Hematologic/Oncology: Negative; no easy bruising, bleeding Endocrine: Negative; no heat/cold intolerance; no diabetes Neuro: Negative; no changes in balance, headaches Skin: Negative; No rashes or skin  lesions Psychiatric: Negative; No behavioral problems, depression Sleep: Obstructive sleep apnea on CPAP therapy.  He admits to Compliance.  No breakthrough snoring, daytime sleepiness, hypersomnolence, bruxism, restless legs, hypnogognic hallucinations, no cataplexy  An Epworth Sleepiness Scale score was calculated in the office today and this endorsed at 14 consistent with mild residual daytime sleepiness.  Other comprehensive 14 point system review is negative.  PE BP 130/80   Pulse (!) 57   Ht 5' 8.6" (1.742 m)   Wt 198 lb (89.8 kg)   SpO2 99%   BMI 29.58 kg/m    Repeat blood pressure by me was 130/72  Wt Readings from Last 3 Encounters:  01/26/21 198 lb (89.8 kg)  11/11/20 208 lb (94.3 kg)  09/11/20 209 lb (94.8 kg)   General: Alert, oriented, no distress.  Skin: normal turgor, no rashes, warm and dry HEENT: Normocephalic, atraumatic. Pupils equal round and reactive to light; sclera anicteric; extraocular muscles intact;  Nose without nasal septal hypertrophy Mouth/Parynx benign; Mallinpatti scale 3 Neck: No JVD, no carotid bruits; normal carotid upstroke Lungs: clear to ausculatation and percussion; no wheezing or rales Chest wall: without tenderness to palpitation Heart: PMI not displaced, RRR, s1 s2 normal, 1/6 systolic murmur, no diastolic murmur, no rubs, gallops, thrills, or heaves Abdomen: soft, nontender; no hepatosplenomehaly, BS+; abdominal aorta nontender and not dilated by palpation. Back: no CVA tenderness Pulses 2+ Musculoskeletal: full range of motion, normal strength, no joint deformities Extremities: no clubbing cyanosis or edema, Homan's sign negative  Neurologic: grossly nonfocal; Cranial nerves grossly wnl Psychologic: Normal mood and affect     January 26, 2021 ECG (independently read by me):  Sinus bradycardia at 57; LAD, LBBBB  An ECG was not done today but ECG from March 25, 2020 was personally reviewed and reveals sinus bradycardia at 54  bpm.  No ectopy.  Normal intervals.  August 10, 2017 ECG (independently read by me): Sinus Bradycardia 50 bpm.  Borderline criteria for LVH.  Normal intervals.  No ectopy.  June 2017 ECG (independently read by me): Sinus bradycardia 57 bpm.  Nonspecific T-wave in lead 03 March 2014 ECG (independently read by me): Sinus bradycardia at 57 bpm.  No significant ST segment changes.  November  2014 ECG sinus rhythm at 57 beats per minute. PR interval 190 ms QTc interval 377 ms  LABS: Laboratory from Eugene Murray from May 05, 2017.: Vitamin D level 55.  Hemoglobin A1c 5.5.  Total cholesterol 118, HDL 37, LDL 70, triglycerides 53 and VLDL 11.  Hemoglobin hematocrit 15.0 and 43.4.  Creatinine 1.07.  Normal LFTs.  Is recent laboratory from Eugene Murray on September 11, 2020 was reviewed.  Total cholesterol 126, LDL cholesterol 71, HDL 38 and triglycerides 88.  BUN 13, creatinine 1.04.   BMP Latest Ref Rng & Units 09/11/2020 03/05/2020 08/30/2019  Glucose 65 - 99 mg/dL 82 78 83  BUN 8 - 27 mg/dL _0 Creatinine 0.76 - 1.27 mg/dL 1.04 1.20 1.07  BUN/Creat Ratio 10 - _1 Sodium 134 - 144 mmol/L 140 142 142  Potassium 3.5 - 5.2 mmol/L 4.1 4.2 3.7  Chloride 96 - 106 mmol/L 101 103 105  CO2 20 - 29 mmol/L _2 Calcium 8.6 - 10.2 mg/dL 8.6 8.8 9.0    Hepatic Function Latest Ref Rng & Units 09/11/2020 03/05/2020 08/30/2019  Total Protein 6.0 - 8.5 g/dL 6.9 6.8 6.9  Albumin 3.7 - 4.7 g/dL 4.4 4.2 4.1  AST 0 - 40 IU/L _3 ALT 0 - 44 IU/L _4 Alk Phosphatase 44 - 121 IU/L 84 78 75  Total Bilirubin 0.0 - 1.2 mg/dL 0.5 0.7 0.6    CBC Latest Ref Rng & Units 09/11/2020 08/05/2015 08/21/2014  WBC 3.4 - 10.8 x10E3/uL 5.8 5.1 5.8  Hemoglobin 13.0 - 17.7 g/dL 14.5 14.1 15.0  Hematocrit 37.5 - 51.0 % 42.7 41.3 42.3  Platelets 150 - 450 x10E3/uL 144(L) 147 145(L)    Lab Results  Component Value Date   MCV 88 09/11/2020   MCV 88.2 08/05/2015   MCV 85.3 08/21/2014    Lab Results   Component Value Date   TSH 0.73 08/05/2015   Lab Results  Component Value Date   HGBA1C 5.4 07/01/2012   Lipid Panel     Component Value Date/Time   CHOL 126 09/11/2020 1148   TRIG 88 09/11/2020 1148   HDL 38 (L) 09/11/2020 1148   CHOLHDL 3.3 09/11/2020 1148   CHOLHDL 3.1 08/05/2015 1050   VLDL 14 08/05/2015 1050   LDLCALC 71 09/11/2020 1148     RADIOLOGY: Ct Head (brain) Wo Contrast  06/30/2012   *RADIOLOGY REPORT*  Clinical Data: Acute onset right upper and lower extremity weakness.  Right-sided foot drop.  CT HEAD WITHOUT CONTRAST  Technique:  Contiguous axial images were obtained from the base of the skull through the vertex without contrast.  Comparison: None.  Findings: No evidence of acute infarct, acute hemorrhage, mass lesion, mass effect or hydrocephalus.  Minimal periventricular low attenuation.  Small retention cysts or polyps are seen in the left maxillary sinus.  IMPRESSION:  1.  No acute findings. 2.  Minimal chronic microvascular white matter ischemic changes.   Original Report Authenticated By: Eugene Murray, M.D.   Mr Angiogram Head Wo Contrast  06/30/2012   *RADIOLOGY REPORT*  Clinical Data:  Right-sided weakness.  Hypertension.  History prostate Murray.  MRI BRAIN WITHOUT CONTRAST MRA HEAD WITHOUT CONTRAST  Technique: Multiplanar, multiecho pulse sequences of the brain and surrounding structures were obtained according to standard protocol without intravenous contrast.  Angiographic images of the head were obtained using MRA technique without contrast.  Comparison: 06/30/2012 head CT.  No comparison  brain MR.  MRI HEAD  Findings:  Question tiny acute infarct left pontomedullary junction.  Moderate white matter type changes most consistent with result of small vessel disease.  No intracranial hemorrhage.  No hydrocephalus.  No intracranial mass lesion detected on this unenhanced exam.  Major intracranial vascular structures are patent.  Upper cervical cord is of decreased  caliber of questionable significance/etiology.  Cervical medullary junction, pituitary region and pineal region unremarkable.  Exophthalmos.  IMPRESSION: Question tiny acute infarct left pontomedullary junction.  Moderate white matter type changes most consistent with result of small vessel disease.  Please see above  MRA HEAD  Findings: Anterior circulation without medium or large size vessel significant stenosis or occlusion.  Middle cerebral artery mild branch vessel irregularity bilaterally.  Mild narrowing distal M1 segment left middle cerebral artery.  Ectatic vertebral arteries and basilar artery.  Nonvisualization left PICA and right AICA.  Mild branch vessel irregularity superior cerebellar artery and posterior cerebral artery bilaterally.  No aneurysm or vascular malformation noted.  IMPRESSION: Mild intracranial atherosclerotic type changes as detailed above the   Original Report Authenticated By: Eugene Murray, M.D.   Mr Brain Wo Contrast  06/30/2012   *RADIOLOGY REPORT*  Clinical Data:  Right-sided weakness.  Hypertension.  History prostate Murray.  MRI BRAIN WITHOUT CONTRAST MRA HEAD WITHOUT CONTRAST  Technique: Multiplanar, multiecho pulse sequences of the brain and surrounding structures were obtained according to standard protocol without intravenous contrast.  Angiographic images of the head were obtained using MRA technique without contrast.  Comparison: 06/30/2012 head CT.  No comparison brain MR.  MRI HEAD  Findings:  Question tiny acute infarct left pontomedullary junction.  Moderate white matter type changes most consistent with result of small vessel disease.  No intracranial hemorrhage.  No hydrocephalus.  No intracranial mass lesion detected on this unenhanced exam.  Major intracranial vascular structures are patent.  Upper cervical cord is of decreased caliber of questionable significance/etiology.  Cervical medullary junction, pituitary region and pineal region unremarkable.   Exophthalmos.  IMPRESSION: Question tiny acute infarct left pontomedullary junction.  Moderate white matter type changes most consistent with result of small vessel disease.  Please see above  MRA HEAD  Findings: Anterior circulation without medium or large size vessel significant stenosis or occlusion.  Middle cerebral artery mild branch vessel irregularity bilaterally.  Mild narrowing distal M1 segment left middle cerebral artery.  Ectatic vertebral arteries and basilar artery.  Nonvisualization left PICA and right AICA.  Mild branch vessel irregularity superior cerebellar artery and posterior cerebral artery bilaterally.  No aneurysm or vascular malformation noted.  IMPRESSION: Mild intracranial atherosclerotic type changes as detailed above the   Original Report Authenticated By: Eugene Murray, M.D.    IMPRESSION: Encounter Diagnoses  Name Primary?   CAD S/P percutaneous coronary angioplasty Yes   Essential hypertension    Hyperlipidemia with target LDL less than 70    OSA (obstructive sleep apnea)    Stenosis of right subclavian artery (HCC)    Angina, class I - II ([place]); improved     ASSESSMENT AND PLAN: [name] is an 80 year old African-American male who has CAD and underwent initial percutaneous coronary intervention in November 2008 to his diagonal and RCA.  In  April 2011 he was found to have a new 95% stenosis beyond his distal RCA stent for which a new DES stent was inserted.  He underwent a follow-up nuclear perfusion study on April 10, 2020 after experiencing some mild chest pressure with walking onset.  This was essentially normal with nuclear stress EF at 55% without evidence for prior infarct or ischemia.  Due to a right carotid bruit he underwent carotid ultrasound on April 10, 2020 which did not reveal any carotid disease.  However the right subclavian artery was stenotic.  He had normal flow hemodynamics in the left subclavian artery.  When I saw him in May 2022 he  was experiencing mild class II anginal symptomatology with a rapid walk.  At that time I added isosorbide 30 mg.  He has noticed significant benefit since this was added to his current medical regimen.  His blood pressure today is stable on amlodipine 10 mg, lisinopril 40 mg, HCTZ 12.5 mg and metoprolol tartrate 50 mg twice a day.  He continues to be on clopidogrel and aspirin.  He is on atorvastatin 40 mg daily.  I reviewed his recent laboratory from Eugene Murray.  He told me that he had not seen Eugene Murray in some time.  Her sleep coordinator had obtained a download of his device which reveals compliance with usage and time but average use was only 5 hours and 43 minutes.  His AHI was 2.8.  He continues to have residual daytime sleepiness which I suspect is due to inadequate sleep duration.  I discussed with him 7 and 9 hours is optimal sleep for an adult will return to Eugene Murray for his sleep care.  He admits to being under increased stress on the home front since his wife suffered a significant stroke on November 18, 2020.  I will see him in 6 to 9 months for follow-up evaluation or sooner as needed   Eugene Murray 01/26/2021 12:23 PM

## 2021-01-26 NOTE — Patient Instructions (Signed)
  Follow-Up: At [facility], you and your health needs are our priority.  As part of our continuing mission to provide you with exceptional heart care, we have created designated Provider Care Teams.  These Care Teams include your primary Cardiologist (physician) and Advanced Practice Providers (APPs -  Physician Assistants and Nurse Practitioners) who all work together to provide you with the care you need, when you need it.  We recommend signing up for the patient portal called "MyChart".  Sign up information is provided on this After Visit Summary.  MyChart is used to connect with patients for Virtual Visits (Telemedicine).  Patients are able to view lab/test results, encounter notes, upcoming appointments, etc.  Non-urgent messages can be sent to your provider as well.   To learn more about what you can do with MyChart, go to [email or web address].    Your next appointment:   6-9 month(s)  The format for your next appointment:   In Person  Provider:   Shelva Majestic, MD

## 2021-02-18 ENCOUNTER — Telehealth: Payer: Medicare Other

## 2021-02-18 ENCOUNTER — Ambulatory Visit (INDEPENDENT_AMBULATORY_CARE_PROVIDER_SITE_OTHER): Payer: Medicare Other

## 2021-02-18 DIAGNOSIS — I119 Hypertensive heart disease without heart failure: Secondary | ICD-10-CM

## 2021-02-18 DIAGNOSIS — I10 Essential (primary) hypertension: Secondary | ICD-10-CM

## 2021-02-18 DIAGNOSIS — I25118 Atherosclerotic heart disease of native coronary artery with other forms of angina pectoris: Secondary | ICD-10-CM

## 2021-02-18 DIAGNOSIS — M25562 Pain in left knee: Secondary | ICD-10-CM

## 2021-02-18 DIAGNOSIS — E7800 Pure hypercholesterolemia, unspecified: Secondary | ICD-10-CM

## 2021-02-18 DIAGNOSIS — G8929 Other chronic pain: Secondary | ICD-10-CM

## 2021-02-19 NOTE — Patient Instructions (Signed)
Visit Information  Thank you for taking time to visit with me today. Please don't hesitate to contact me if I can be of assistance to you before our next scheduled telephone appointment.  Following are the goals we discussed today:  (Copy and paste patient goals from clinical care plan here)  Our next appointment is by telephone on 04/21/21 at 12:10 PM   Please call the care guide team at [phone] if you need to cancel or reschedule your appointment.   If you are experiencing a Mental Health or [place] or need someone to talk to, please call 1-800-273-TALK (toll free, 24 hour hotline)   Patient verbalizes understanding of instructions provided today and agrees to view in [place].   Barb Merino, RN, BSN, CCM Care Management Coordinator [facility]  Direct Phone: [phone]

## 2021-02-19 NOTE — Chronic Care Management (AMB) (Signed)
Chronic Care Management   CCM RN Visit Note  02/18/2021 Name: Eugene Murray MRN: [id number] DOB: September 20, 1940  Subjective: Eugene Murray is a 80 y.o. year old male who is a primary care patient of [name], MD. The care management team was consulted for assistance with disease management and care coordination needs.    Engaged with patient by telephone for follow up visit in response to provider referral for case management and/or care coordination services.   Consent to Services:  The patient was given information about Chronic Care Management services, agreed to services, and gave verbal consent prior to initiation of services.  Please see initial visit note for detailed documentation.   Patient agreed to services and verbal consent obtained.   Assessment: Review of patient past medical history, allergies, medications, health status, including review of consultants reports, laboratory and other test data, was performed as part of comprehensive evaluation and provision of chronic care management services.   SDOH (Social Determinants of Health) assessments and interventions performed:  Yes, provided contact number for embedded BSW Daneen Schick, patient will consider reaching out for caregiver resources if needed   CCM Care Plan  Allergies  Allergen Reactions   Other Hives    SEAFOOD   Penicillins Other (See Comments)    "caused me to pass out"   Sulfa Antibiotics Hives    Outpatient Encounter Medications as of 02/18/2021  Medication Sig   amLODipine (NORVASC) 10 MG tablet Take 1 tablet (10 mg total) by mouth daily.   atorvastatin (LIPITOR) 40 MG tablet TAKE 1 TABLET(40 MG) BY MOUTH DAILY   Cholecalciferol (VITAMIN D) 2000 units CAPS Take 2,000 Units by mouth daily.   clopidogrel (PLAVIX) 75 MG tablet Take 1 tablet (75 mg total) by mouth daily.   EPINEPHrine (EPIPEN 2-PAK) 0.3 mg/0.3 mL IJ SOAJ injection Inject 0.3 mLs (0.3 mg total) into the muscle as needed for  anaphylaxis.   hydrochlorothiazide (MICROZIDE) 12.5 MG capsule TAKE 1 CAPSULE(12.5 MG) BY MOUTH DAILY   lisinopril (ZESTRIL) 40 MG tablet TAKE 1 TABLET BY MOUTH DAILY   magnesium oxide (MAG-OX) 400 MG tablet Take 400 mg by mouth daily.   metoprolol tartrate (LOPRESSOR) 50 MG tablet TAKE 1 TABLET(50 MG) BY MOUTH TWICE DAILY   nitroGLYCERIN (NITROSTAT) 0.4 MG SL tablet Place 1 tablet (0.4 mg total) under the tongue every 5 (five) minutes as needed for chest pain.   predniSONE (STERAPRED UNI-PAK 21 TAB) 10 MG (21) TBPK tablet Use as directed (Patient not taking: Reported on 01/26/2021)   No facility-administered encounter medications on file as of 02/18/2021.    Patient Active Problem List   Diagnosis Date Noted   Chest pain with moderate risk for cardiac etiology 03/25/2020   Right carotid bruit 03/25/2020   History of CVA (cerebrovascular accident) 04/02/2016   Back pain, chronic 07/26/2012   Right leg weakness 07/26/2012   Migraine variant 07/26/2012   Stroke-like symptoms 07/26/2012   Sinus bradycardia 06/30/2012   CAD S/P percutaneous coronary angioplasty 06/30/2012   Dyslipidemia 06/30/2012   Hypokalemia 06/30/2012   Obstructive sleep apnea 05/05/2007   Essential hypertension 05/05/2007   ALLERGIC RHINITIS 05/05/2007   History of prostate cancer 05/05/2007    Conditions to be addressed/monitored: Essential hypertension, Pure hypercholesteremia, Atherosclerosis of native coronary artery of native heart with other form of angina pectoris, Hypertensive Heart disease without heart failure, Chronic pain of left pain   Care Plan : RN Care Manager Plan of Care  Updates made by Barb Merino  L, RN since 02/18/2021 12:00 AM     Problem: No plan of care established for management of chronic disease (Essential HTN, Pure hypercholesteremia, Atherosclerosis of native coronary artery of native heart with other form of angina pectoris, Hypertensive Heart disease, Chronic pain of left pain    Priority: High     Long-Range Goal: Establishment of plan of care for chronic disease management for ((Essential HTN, Pure hypercholesteremia, Atherosclerosis of native coronary artery of native heart with other form of angina pectoris, Hypertensive Heart disease, Chronic pain LLE)   Start Date: 02/18/2021  Expected End Date: 02/18/2022  This Visit's Progress: On track  Priority: High  Note:   Current Barriers:  Knowledge Deficits related to plan of care for management of (Essential HTN, Pure hypercholesteremia, Atherosclerosis of native coronary artery of native heart with other form of angina pectoris, Hypertensive Heart disease, Chronic pain of LLE)  Chronic Disease Management support and education needs related to (Essential HTN, Pure hypercholesteremia, Atherosclerosis of native coronary artery of native heart with other form of angina pectoris, Hypertensive Heart disease, Chronic pain of LLE)  Caregiver Stress  RNCM Clinical Goal(s):  Patient will verbalize basic understanding of  (Essential HTN, Pure hypercholesteremia, Atherosclerosis of native coronary artery of native heart with other form of angina pectoris, Hypertensive Heart disease, Chronic pain of LLE) disease process and self health management plan as evidenced by patient will report having no disease exacerbations related to his chronic disease states as listed above take all medications exactly as prescribed and will call provider for medication related questions as evidenced by patient will report having no missed doses of his prescribed medications  demonstrate Ongoing health management independence as evidenced by patient will report 100% adherence to his prescribed treatment plan  continue to work with RN Care Manager to address care management and care coordination needs related to  (Essential HTN, Pure hypercholesteremia, Atherosclerosis of native coronary artery of native heart with other form of angina pectoris,  Hypertensive Heart disease, Chronic pain of left pain as evidenced by adherence to CM Team Scheduled appointments demonstrate ongoing self health care management ability   as evidenced by    through collaboration with RN Care manager, provider, and care team.   Interventions: 1:1 collaboration with primary care provider regarding development and update of comprehensive plan of care as evidenced by provider attestation and co-signature Inter-disciplinary care team collaboration (see longitudinal plan of care) Evaluation of current treatment plan related to  self management and patient's adherence to plan as established by provider   CAD Interventions: (Status:  Goal on track:  Yes.) Long Term Goal Evaluation of current treatment plan related to CAD, self-management and patient's adherence to plan as established by provider Discussed and reviewed recent Cardiology follow up with Dr. Claiborne Billings, Cardiologist completed on 01/26/21 Assessed understanding of CAD diagnosis Medications reviewed including medications utilized in CAD treatment plan Provided education on importance of blood pressure control in management of CAD Provided education on Importance of limiting foods high in cholesterol Counseled on importance of regular laboratory monitoring as prescribed Reviewed Importance of taking all medications as prescribed Reviewed Importance of attending all scheduled provider appointments Screening for signs and symptoms of depression related to chronic disease state Assessed social determinant of health barriers Discussed plans with patient for ongoing care management follow up and provided patient with direct contact information for care management team   OSA with CPAP use:  (Status:  New goal.)  Long Term Goal Evaluation of current treatment plan  related to  OSA w/CPAP use , self-management and patient's adherence to plan as established by provider Review of patient status, including review of  consultant's reports, relevant laboratory and other test results, and medications completed Determined patient is wearing a full face mask with CPAP, he is only sleeping 4-6 hours each night due to having to care for his wife who is ill following a stroke Educated patient on recommendations for usage while providing rationale for importance of adherence for best effectiveness and to help reduce risk for complications of OSA if untreated Educated on nasal pillow and confirmed CPAP is functioning normally and supplies are on hand that may be needed Discussed plans with patient for ongoing care management follow up and provided patient with direct contact information for care management team  Patient Goals/Self-Care Activities: Take all medications as prescribed Attend all scheduled provider appointments Call pharmacy for medication refills 3-7 days in advance of running out of medications Attend church or other social activities Perform all self care activities independently  Perform IADL's (shopping, preparing meals, housekeeping, managing finances) independently Call provider office for new concerns or questions  adhere to prescribed diet: low Sodium  develop an exercise routine Use CPAP as directed, consider switching to nasal pillow if persistent issues with improper seal of face mask  Follow Up Plan:  Telephone follow up appointment with care management team member scheduled for:  04/21/21      Plan:Telephone follow up appointment with care management team member scheduled for:  04/21/21  Barb Merino, RN, BSN, CCM Care Management Coordinator [facility]  Direct Phone: [phone]

## 2021-02-28 DIAGNOSIS — I25118 Atherosclerotic heart disease of native coronary artery with other forms of angina pectoris: Secondary | ICD-10-CM | POA: Diagnosis not present

## 2021-02-28 DIAGNOSIS — I119 Hypertensive heart disease without heart failure: Secondary | ICD-10-CM | POA: Diagnosis not present

## 2021-02-28 DIAGNOSIS — I10 Essential (primary) hypertension: Secondary | ICD-10-CM

## 2021-02-28 DIAGNOSIS — E7800 Pure hypercholesterolemia, unspecified: Secondary | ICD-10-CM

## 2021-03-16 ENCOUNTER — Other Ambulatory Visit: Payer: Self-pay | Admitting: Cardiovascular Disease

## 2021-04-04 ENCOUNTER — Telehealth: Payer: Medicare Other | Admitting: Nurse Practitioner

## 2021-04-04 DIAGNOSIS — U071 COVID-19: Secondary | ICD-10-CM | POA: Diagnosis not present

## 2021-04-04 MED ORDER — MOLNUPIRAVIR EUA 200MG CAPSULE
4.0000 | ORAL_CAPSULE | Freq: Two times a day (BID) | ORAL | 0 refills | Status: AC
Start: 2021-04-04 — End: 2021-04-09

## 2021-04-04 NOTE — Patient Instructions (Signed)
COVID-19: Quarantine and Isolation °Quarantine °If you were exposed °Quarantine and stay away from others when you have been in close contact with someone who has COVID-19. °Isolate °If you are sick or test positive °Isolate when you are sick or when you have COVID-19, even if you don't have symptoms. °When to stay home °Calculating quarantine °The date of your exposure is considered day 0. Day 1 is the first full day after your last contact with a person who has had COVID-19. Stay home and away from other people for at least 5 days. Learn why CDC updated guidance for the general public. °IF YOU were exposed to COVID-19 and are NOT  °up to dateIF YOU were exposed to COVID-19 and are NOT on COVID-19 vaccinations °Quarantine for at least 5 days °Stay home °Stay home and quarantine for at least 5 full days. °Wear a well-fitting mask if you must be around others in your home. °Do not travel. °Get tested °Even if you don't develop symptoms, get tested at least 5 days after you last had close contact with someone with COVID-19. °After quarantine °Watch for symptoms °Watch for symptoms until 10 days after you last had close contact with someone with COVID-19. °Avoid travel °It is best to avoid travel until a full 10 days after you last had close contact with someone with COVID-19. °If you develop symptoms °Isolate immediately and get tested. Continue to stay home until you know the results. Wear a well-fitting mask around others. °Take precautions until day 10 °Wear a well-fitting mask °Wear a well-fitting mask for 10 full days any time you are around others inside your home or in public. Do not go to places where you are unable to wear a well-fitting mask. °If you must travel during days 6-10, take precautions. °Avoid being around people who are more likely to get very sick from COVID-19. °IF YOU were exposed to COVID-19 and are  °up to dateIF YOU were exposed to COVID-19 and are on COVID-19 vaccinations °No  quarantine °You do not need to stay home unless you develop symptoms. °Get tested °Even if you don't develop symptoms, get tested at least 5 days after you last had close contact with someone with COVID-19. °Watch for symptoms °Watch for symptoms until 10 days after you last had close contact with someone with COVID-19. °If you develop symptoms °Isolate immediately and get tested. Continue to stay home until you know the results. Wear a well-fitting mask around others. °Take precautions until day 10 °Wear a well-fitting mask °Wear a well-fitting mask for 10 full days any time you are around others inside your home or in public. Do not go to places where you are unable to wear a well-fitting mask. °Take precautions if traveling °Avoid being around people who are more likely to get very sick from COVID-19. °IF YOU were exposed to COVID-19 and had confirmed COVID-19 within the past 90 days (you tested positive using a viral test) °No quarantine °You do not need to stay home unless you develop symptoms. °Watch for symptoms °Watch for symptoms until 10 days after you last had close contact with someone with COVID-19. °If you develop symptoms °Isolate immediately and get tested. Continue to stay home until you know the results. Wear a well-fitting mask around others. °Take precautions until day 10 °Wear a well-fitting mask °Wear a well-fitting mask for 10 full days any time you are around others inside your home or in public. Do not go to places where you are   unable to wear a well-fitting mask. °Take precautions if traveling °Avoid being around people who are more likely to get very sick from COVID-19. °Calculating isolation °Day 0 is your first day of symptoms or a positive viral test. Day 1 is the first full day after your symptoms developed or your test specimen was collected. If you have COVID-19 or have symptoms, isolate for at least 5 days. °IF YOU tested positive for COVID-19 or have symptoms, regardless of  vaccination status °Stay home for at least 5 days °Stay home for 5 days and isolate from others in your home. °Wear a well-fitting mask if you must be around others in your home. °Do not travel. °Ending isolation if you had symptoms °End isolation after 5 full days if you are fever-free for 24 hours (without the use of fever-reducing medication) and your symptoms are improving. °Ending isolation if you did NOT have symptoms °End isolation after at least 5 full days after your positive test. °If you got very sick from COVID-19 or have a weakened immune system °You should isolate for at least 10 days. Consult your doctor before ending isolation. °Take precautions until day 10 °Wear a well-fitting mask °Wear a well-fitting mask for 10 full days any time you are around others inside your home or in public. Do not go to places where you are unable to wear a well-fitting mask. °Do not travel °Do not travel until a full 10 days after your symptoms started or the date your positive test was taken if you had no symptoms. °Avoid being around people who are more likely to get very sick from COVID-19. °Definitions °Exposure °Contact with someone infected with SARS-CoV-2, the virus that causes COVID-19, in a way that increases the likelihood of getting infected with the virus. °Close contact °A close contact is someone who was less than 6 feet away from an infected person (laboratory-confirmed or a clinical diagnosis) for a cumulative total of 15 minutes or more over a 24-hour period. For example, three individual 5-minute exposures for a total of 15 minutes. People who are exposed to someone with COVID-19 after they completed at least 5 days of isolation are not considered close contacts. °Quarantine °Quarantine is a strategy used to prevent transmission of COVID-19 by keeping people who have been in close contact with someone with COVID-19 apart from others. °Who does not need to quarantine? °If you had close contact with  someone with COVID-19 and you are in one of the following groups, you do not need to quarantine. °You are up to date with your COVID-19 vaccines. °You had confirmed COVID-19 within the last 90 days (meaning you tested positive using a viral test). °If you are up to date with COVID-19 vaccines, you should wear a well-fitting mask around others for 10 days from the date of your last close contact with someone with COVID-19 (the date of last close contact is considered day 0). Get tested at least 5 days after you last had close contact with someone with COVID-19. If you test positive or develop COVID-19 symptoms, isolate from other people and follow recommendations in the Isolation section below. If you tested positive for COVID-19 with a viral test within the previous 90 days and subsequently recovered and remain without COVID-19 symptoms, you do not need to quarantine or get tested after close contact. You should wear a well-fitting mask around others for 10 days from the date of your last close contact with someone with COVID-19 (the date of last   close contact is considered day 0). If you have COVID-19 symptoms, get tested and isolate from other people and follow recommendations in the Isolation section below. °Who should quarantine? °If you come into close contact with someone with COVID-19, you should quarantine if you are not up to date on COVID-19 vaccines. This includes people who are not vaccinated. °What to do for quarantine °Stay home and away from other people for at least 5 days (day 0 through day 5) after your last contact with a person who has COVID-19. The date of your exposure is considered day 0. Wear a well-fitting mask when around others at home, if possible. °For 10 days after your last close contact with someone with COVID-19, watch for fever (100.4°F or greater), cough, shortness of breath, or other COVID-19 symptoms. °If you develop symptoms, get tested immediately and isolate until you receive  your test results. If you test positive, follow isolation recommendations. °If you do not develop symptoms, get tested at least 5 days after you last had close contact with someone with COVID-19. °If you test negative, you can leave your home, but continue to wear a well-fitting mask when around others at home and in public until 10 days after your last close contact with someone with COVID-19. °If you test positive, you should isolate for at least 5 days from the date of your positive test (if you do not have symptoms). If you do develop COVID-19 symptoms, isolate for at least 5 days from the date your symptoms began (the date the symptoms started is day 0). Follow recommendations in the isolation section below. °If you are unable to get a test 5 days after last close contact with someone with COVID-19, you can leave your home after day 5 if you have been without COVID-19 symptoms throughout the 5-day period. Wear a well-fitting mask for 10 days after your date of last close contact when around others at home and in public. °Avoid people who are have weakened immune systems or are more likely to get very sick from COVID-19, and nursing homes and other high-risk settings, until after at least 10 days. °If possible, stay away from people you live with, especially people who are at higher risk for getting very sick from COVID-19, as well as others outside your home throughout the full 10 days after your last close contact with someone with COVID-19. °If you are unable to quarantine, you should wear a well-fitting mask for 10 days when around others at home and in public. °If you are unable to wear a mask when around others, you should continue to quarantine for 10 days. Avoid people who have weakened immune systems or are more likely to get very sick from COVID-19, and nursing homes and other high-risk settings, until after at least 10 days. °See additional information about travel. °Do not go to places where you are  unable to wear a mask, such as restaurants and some gyms, and avoid eating around others at home and at work until after 10 days after your last close contact with someone with COVID-19. °After quarantine °Watch for symptoms until 10 days after your last close contact with someone with COVID-19. °If you have symptoms, isolate immediately and get tested. °Quarantine in high-risk congregate settings °In certain congregate settings that have high risk of secondary transmission (such as correctional and detention facilities, homeless shelters, or cruise ships), CDC recommends a 10-day quarantine for residents, regardless of vaccination and booster status. During periods of critical staffing   shortages, facilities may consider shortening the quarantine period for staff to ensure continuity of operations. Decisions to shorten quarantine in these settings should be made in consultation with state, local, tribal, or territorial health departments and should take into consideration the context and characteristics of the facility. CDC's setting-specific guidance provides additional recommendations for these settings. °Isolation °Isolation is used to separate people with confirmed or suspected COVID-19 from those without COVID-19. People who are in isolation should stay home until it's safe for them to be around others. At home, anyone sick or infected should separate from others, or wear a well-fitting mask when they need to be around others. People in isolation should stay in a specific "sick room" or area and use a separate bathroom if available. Everyone who has presumed or confirmed COVID-19 should stay home and isolate from other people for at least 5 full days (day 0 is the first day of symptoms or the date of the day of the positive viral test for asymptomatic persons). They should wear a mask when around others at home and in public for an additional 5 days. People who are confirmed to have COVID-19 or are showing  symptoms of COVID-19 need to isolate regardless of their vaccination status. This includes: °People who have a positive viral test for COVID-19, regardless of whether or not they have symptoms. °People with symptoms of COVID-19, including people who are awaiting test results or have not been tested. People with symptoms should isolate even if they do not know if they have been in close contact with someone with COVID-19. °What to do for isolation °Monitor your symptoms. If you have an emergency warning sign (including trouble breathing), seek emergency medical care immediately. °Stay in a separate room from other household members, if possible. °Use a separate bathroom, if possible. °Take steps to improve ventilation at home, if possible. °Avoid contact with other members of the household and pets. °Don't share personal household items, like cups, towels, and utensils. °Wear a well-fitting mask when you need to be around other people. °Learn more about what to do if you are sick and how to notify your contacts. °Ending isolation for people who had COVID-19 and had symptoms °If you had COVID-19 and had symptoms, isolate for at least 5 days. To calculate your 5-day isolation period, day 0 is your first day of symptoms. Day 1 is the first full day after your symptoms developed. You can leave isolation after 5 full days. °You can end isolation after 5 full days if you are fever-free for 24 hours without the use of fever-reducing medication and your other symptoms have improved (Loss of taste and smell may persist for weeks or months after recovery and need not delay the end of isolation). °You should continue to wear a well-fitting mask around others at home and in public for 5 additional days (day 6 through day 10) after the end of your 5-day isolation period. If you are unable to wear a mask when around others, you should continue to isolate for a full 10 days. Avoid people who have weakened immune systems or are more  likely to get very sick from COVID-19, and nursing homes and other high-risk settings, until after at least 10 days. °If you continue to have fever or your other symptoms have not improved after 5 days of isolation, you should wait to end your isolation until you are fever-free for 24 hours without the use of fever-reducing medication and your other symptoms have improved.   Continue to wear a well-fitting mask through day 10. Contact your healthcare provider if you have questions. °See additional information about travel. °Do not go to places where you are unable to wear a mask, such as restaurants and some gyms, and avoid eating around others at home and at work until a full 10 days after your first day of symptoms. °If an individual has access to a test and wants to test, the best approach is to use an antigen test1 towards the end of the 5-day isolation period. Collect the test sample only if you are fever-free for 24 hours without the use of fever-reducing medication and your other symptoms have improved (loss of taste and smell may persist for weeks or months after recovery and need not delay the end of isolation). If your test result is positive, you should continue to isolate until day 10. If your test result is negative, you can end isolation, but continue to wear a well-fitting mask around others at home and in public until day 10. Follow additional recommendations for masking and avoiding travel as described above. °1As noted in the labeling for authorized over-the counter antigen tests: Negative results should be treated as presumptive. Negative results do not rule out SARS-CoV-2 infection and should not be used as the sole basis for treatment or patient management decisions, including infection control decisions. To improve results, antigen tests should be used twice over a three-day period with at least 24 hours and no more than 48 hours between tests. °Note that these recommendations on ending isolation  do not apply to people who are moderately ill or very sick from COVID-19 or have weakened immune systems. See section below for recommendations for when to end isolation for these groups. °Ending isolation for people who tested positive for COVID-19 but had no symptoms °If you test positive for COVID-19 and never develop symptoms, isolate for at least 5 days. Day 0 is the day of your positive viral test (based on the date you were tested) and day 1 is the first full day after the specimen was collected for your positive test. You can leave isolation after 5 full days. °If you continue to have no symptoms, you can end isolation after at least 5 days. °You should continue to wear a well-fitting mask around others at home and in public until day 10 (day 6 through day 10). If you are unable to wear a mask when around others, you should continue to isolate for 10 days. Avoid people who have weakened immune systems or are more likely to get very sick from COVID-19, and nursing homes and other high-risk settings, until after at least 10 days. °If you develop symptoms after testing positive, your 5-day isolation period should start over. Day 0 is your first day of symptoms. Follow the recommendations above for ending isolation for people who had COVID-19 and had symptoms. °See additional information about travel. °Do not go to places where you are unable to wear a mask, such as restaurants and some gyms, and avoid eating around others at home and at work until 10 days after the day of your positive test. °If an individual has access to a test and wants to test, the best approach is to use an antigen test1 towards the end of the 5-day isolation period. If your test result is positive, you should continue to isolate until day 10. If your test result is positive, you can also choose to test daily and if your test result   is negative, you can end isolation, but continue to wear a well-fitting mask around others at home and in  public until day 10. Follow additional recommendations for masking and avoiding travel as described above. °1As noted in the labeling for authorized over-the counter antigen tests: Negative results should be treated as presumptive. Negative results do not rule out SARS-CoV-2 infection and should not be used as the sole basis for treatment or patient management decisions, including infection control decisions. To improve results, antigen tests should be used twice over a three-day period with at least 24 hours and no more than 48 hours between tests. °Ending isolation for people who were moderately or very sick from COVID-19 or have a weakened immune system °People who are moderately ill from COVID-19 (experiencing symptoms that affect the lungs like shortness of breath or difficulty breathing) should isolate for 10 days and follow all other isolation precautions. To calculate your 10-day isolation period, day 0 is your first day of symptoms. Day 1 is the first full day after your symptoms developed. If you are unsure if your symptoms are moderate, talk to a healthcare provider for further guidance. °People who are very sick from COVID-19 (this means people who were hospitalized or required intensive care or ventilation support) and people who have weakened immune systems might need to isolate at home longer. They may also require testing with a viral test to determine when they can be around others. CDC recommends an isolation period of at least 10 and up to 20 days for people who were very sick from COVID-19 and for people with weakened immune systems. Consult with your healthcare provider about when you can resume being around other people. If you are unsure if your symptoms are severe or if you have a weakened immune system, talk to a healthcare provider for further guidance. °People who have a weakened immune system should talk to their healthcare provider about the potential for reduced immune responses to  COVID-19 vaccines and the need to continue to follow current prevention measures (including wearing a well-fitting mask and avoiding crowds and poorly ventilated indoor spaces) to protect themselves against COVID-19 until advised otherwise by their healthcare provider. Close contacts of immunocompromised people--including household members--should also be encouraged to receive all recommended COVID-19 vaccine doses to help protect these people. °Isolation in high-risk congregate settings °In certain high-risk congregate settings that have high risk of secondary transmission and where it is not feasible to cohort people (such as correctional and detention facilities, homeless shelters, and cruise ships), CDC recommends a 10-day isolation period for residents. During periods of critical staffing shortages, facilities may consider shortening the isolation period for staff to ensure continuity of operations. Decisions to shorten isolation in these settings should be made in consultation with state, local, tribal, or territorial health departments and should take into consideration the context and characteristics of the facility. CDC's setting-specific guidance provides additional recommendations for these settings. °This CDC guidance is meant to supplement--not replace--any federal, state, local, territorial, or tribal health and safety laws, rules, and regulations. °Recommendations for specific settings °These recommendations do not apply to healthcare professionals. For guidance specific to these settings, see °Healthcare professionals: Interim Guidance for Managing Healthcare Personnel with SARS-CoV-2 Infection or Exposure to SARS-CoV-2 °Patients, residents, and visitors to healthcare settings: Interim Infection Prevention and Control Recommendations for Healthcare Personnel During the Coronavirus Disease 2019 (COVID-19) Pandemic °Additional setting-specific guidance and recommendations are available. °These  recommendations on quarantine and isolation do apply to K[employer]12 School   settings. Additional guidance is available here: Overview of COVID-19 Quarantine for K-12 Schools °Travelers: Travel information and recommendations °Congregate facilities and other settings: guidance pages for community, work, and school settings °Ongoing COVID-19 exposure FAQs °I live with someone with COVID-19, but I cannot be separated from them. How do we manage quarantine in this situation? °It is very important for people with COVID-19 to remain apart from other people, if possible, even if they are living together. If separation of the person with COVID-19 from others that they live with is not possible, the other people that they live with will have ongoing exposure, meaning they will be repeatedly exposed until that person is no longer able to spread the virus to other people. In this situation, there are precautions you can take to limit the spread of COVID-19: °The person with COVID-19 and everyone they live with should wear a well-fitting mask inside the home. °If possible, one person should care for the person with COVID-19 to limit the number of people who are in close contact with the infected person. °Take steps to protect yourself and others to reduce transmission in the home: °Quarantine if you are not up to date with your COVID-19 vaccines. °Isolate if you are sick or tested positive for COVID-19, even if you don't have symptoms. °Learn more about the public health recommendations for testing, mask use and quarantine of close contacts, like yourself, who have ongoing exposure. These recommendations differ depending on your vaccination status. °What should I do if I have ongoing exposure to COVID-19 from someone I live with? °Recommendations for this situation depend on your vaccination status: °If you are not up to date on COVID-19 vaccines and have ongoing exposure to COVID-19, you should: °Begin quarantine immediately and  continue to quarantine throughout the isolation period of the person with COVID-19. °Continue to quarantine for an additional 5 days starting the day after the end of isolation for the person with COVID-19. °Get tested at least 5 days after the end of isolation of the infected person that lives with them. °If you test negative, you can leave the home but should continue to wear a well-fitting mask when around others at home and in public until 10 days after the end of isolation for the person with COVID-19. °Isolate immediately if you develop symptoms of COVID-19 or test positive. °If you are up to date with COVID-19 vaccines and have ongoing exposure to COVID-19, you should: °Get tested at least 5 days after your first exposure. A person with COVID-19 is considered infectious starting 2 days before they develop symptoms, or 2 days before the date of their positive test if they do not have symptoms. °Get tested again at least 5 days after the end of isolation for the person with COVID-19. °Wear a well-fitting mask when you are around the person with COVID-19, and do this throughout their isolation period. °Wear a well-fitting mask around others for 10 days after the infected person's isolation period ends. °Isolate immediately if you develop symptoms of COVID-19 or test positive. °What should I do if multiple people I live with test positive for COVID-19 at different times? °Recommendations for this situation depend on your vaccination status: °If you are not up to date with your COVID-19 vaccines, you should: °Quarantine throughout the isolation period of any infected person that you live with. °Continue to quarantine until 5 days after the end of isolation date for the most recently infected person that lives with you. For example, if   the last day of isolation of the person most recently infected with COVID-19 was June 30, the new 5-day quarantine period starts on July 1. °Get tested at least 5 days after the end  of isolation for the most recently infected person that lives with you. °Wear a well-fitting mask when you are around any person with COVID-19 while that person is in isolation. °Wear a well-fitting mask when you are around other people until 10 days after your last close contact. °Isolate immediately if you develop symptoms of COVID-19 or test positive. °If you are up to date with your COVID-19 vaccines, you should: °Get tested at least 5 days after your first exposure. A person with COVID-19 is considered infectious starting 2 days before they developed symptoms, or 2 days before the date of their positive test if they do not have symptoms. °Get tested again at least 5 days after the end of isolation for the most recently infected person that lives with you. °Wear a well-fitting mask when you are around any person with COVID-19 while that person is in isolation. °Wear a well-fitting mask around others for 10 days after the end of isolation for the most recently infected person that lives with you. For example, if the last day of isolation for the person most recently infected with COVID-19 was June 30, the new 10-day period to wear a well-fitting mask indoors in public starts on July 1. °Isolate immediately if you develop symptoms of COVID-19 or test positive. °I had COVID-19 and completed isolation. Do I have to quarantine or get tested if someone I live with gets COVID-19 shortly after I completed isolation? °No. If you recently completed isolation and someone that lives with you tests positive for the virus that causes COVID-19 shortly after the end of your isolation period, you do not have to quarantine or get tested as long as you do not develop new symptoms. Once all of the people that live together have completed isolation or quarantine, refer to the guidance below for new exposures to COVID-19. °If you had COVID-19 in the previous 90 days and then came into close contact with someone with COVID-19, you do  not have to quarantine or get tested if you do not have symptoms. But you should: °Wear a well-fitting mask indoors in public for 10 days after your last close contact. °Monitor for COVID-19 symptoms for 10 days from the date of your last close contact. °Isolate immediately and get tested if symptoms develop. °If more than 90 days have passed since your recovery from infection, follow CDC's recommendations for close contacts. These recommendations will differ depending on your vaccination status. °05/28/2020 °Content source: National Center for Immunization and Respiratory Diseases (NCIRD), Division of Viral Diseases °This information is not intended to replace advice given to you by your health care provider. Make sure you discuss any questions you have with your health care provider. °Document Revised: 10/01/2020 Document Reviewed: 10/01/2020 °Elsevier Patient Education © 2022 Else[employer] Inc. ° °

## 2021-04-04 NOTE — Progress Notes (Signed)
Virtual Visit Consent   Eugene Murray, you are scheduled for a virtual visit with Mary-Margaret Hassell Done, [place], a [facility] provider, today.     Just as with appointments in the office, your consent must be obtained to participate.  Your consent will be active for this visit and any virtual visit you may have with one of our providers in the next 365 days.     If you have a MyChart account, a copy of this consent can be sent to you electronically.  All virtual visits are billed to your insurance company just like a traditional visit in the office.    As this is a virtual visit, video technology does not allow for your provider to perform a traditional examination.  This may limit your provider's ability to fully assess your condition.  If your provider identifies any concerns that need to be evaluated in person or the need to arrange testing (such as labs, EKG, etc.), we will make arrangements to do so.     Although advances in technology are sophisticated, we cannot ensure that it will always work on either your end or our end.  If the connection with a video visit is poor, the visit may have to be switched to a telephone visit.  With either a video or telephone visit, we are not always able to ensure that we have a secure connection.     I need to obtain your verbal consent now.   Are you willing to proceed with your visit today? YES   Eugene Murray has provided verbal consent on 04/04/2021 for a virtual visit (video or telephone).   Mary-Margaret Hassell Done, FNP   Date: 04/04/2021 1:48 PM   Virtual Visit via Video Note   I, Mary-Margaret Hassell Done, connected with Eugene Murray ([id number], 1940/09/05) on 04/04/21 at  2:00 PM EST by a video-enabled telemedicine application and verified that I am speaking with the correct person using two identifiers.  Location: Patient: Virtual Visit Location Patient: Home Provider: Virtual Visit Location Provider: Mobile   I discussed the limitations of  evaluation and management by telemedicine and the availability of in person appointments. The patient expressed understanding and agreed to proceed.    History of Present Illness: Eugene Murray is a 81 y.o. who identifies as a male who was assigned male at birth, and is being seen today for covid positive .  HPI: URI  This is a new problem. The current episode started in the past 7 days. The problem has been gradually worsening. There has been no fever (99.6). Associated symptoms include congestion, coughing, ear pain, headaches, rhinorrhea and a sore throat. He has tried acetaminophen for the symptoms. The treatment provided mild relief.  Tested positive for covid on Friday. Review of Systems  HENT:  Positive for congestion, ear pain, rhinorrhea and sore throat.   Respiratory:  Positive for cough.   Neurological:  Positive for headaches.   Problems:  Patient Active Problem List   Diagnosis Date Noted   Chest pain with moderate risk for cardiac etiology 03/25/2020   Right carotid bruit 03/25/2020   History of CVA (cerebrovascular accident) 04/02/2016   Back pain, chronic 07/26/2012   Right leg weakness 07/26/2012   Migraine variant 07/26/2012   Stroke-like symptoms 07/26/2012   Sinus bradycardia 06/30/2012   CAD S/P percutaneous coronary angioplasty 06/30/2012   Dyslipidemia 06/30/2012   Hypokalemia 06/30/2012   Obstructive sleep apnea 05/05/2007   Essential hypertension 05/05/2007   ALLERGIC  RHINITIS 05/05/2007   History of prostate cancer 05/05/2007    Allergies:  Allergies  Allergen Reactions   Other Hives    SEAFOOD   Penicillins Other (See Comments)    "caused me to pass out"   Sulfa Antibiotics Hives   Medications:  Current Outpatient Medications:    amLODipine (NORVASC) 10 MG tablet, Take 1 tablet (10 mg total) by mouth daily., Disp: 90 tablet, Rfl: 3   atorvastatin (LIPITOR) 40 MG tablet, TAKE 1 TABLET(40 MG) BY MOUTH DAILY, Disp: 30 tablet, Rfl: 10    Cholecalciferol (VITAMIN D) 2000 units CAPS, Take 2,000 Units by mouth daily., Disp: , Rfl:    clopidogrel (PLAVIX) 75 MG tablet, Take 1 tablet (75 mg total) by mouth daily., Disp: 90 tablet, Rfl: 3   EPINEPHrine (EPIPEN 2-PAK) 0.3 mg/0.3 mL IJ SOAJ injection, Inject 0.3 mLs (0.3 mg total) into the muscle as needed for anaphylaxis., Disp: 2 each, Rfl: 1   hydrochlorothiazide (MICROZIDE) 12.5 MG capsule, TAKE 1 CAPSULE(12.5 MG) BY MOUTH DAILY, Disp: 90 capsule, Rfl: 2   isosorbide mononitrate (IMDUR) 30 MG 24 hr tablet, Take 1 tablet (30 mg total) by mouth daily., Disp: 90 tablet, Rfl: 3   lisinopril (ZESTRIL) 40 MG tablet, TAKE 1 TABLET BY MOUTH DAILY, Disp: 90 tablet, Rfl: 3   magnesium oxide (MAG-OX) 400 MG tablet, Take 400 mg by mouth daily., Disp: , Rfl:    metoprolol tartrate (LOPRESSOR) 50 MG tablet, TAKE 1 TABLET(50 MG) BY MOUTH TWICE DAILY, Disp: 180 tablet, Rfl: 3   nitroGLYCERIN (NITROSTAT) 0.4 MG SL tablet, Place 1 tablet (0.4 mg total) under the tongue every 5 (five) minutes as needed for chest pain., Disp: 25 tablet, Rfl: 2   predniSONE (STERAPRED UNI-PAK 21 TAB) 10 MG (21) TBPK tablet, Use as directed (Patient not taking: Reported on 01/26/2021), Disp: 21 each, Rfl: 0  Observations/Objective: Patient is well-developed, well-nourished in no acute distress.  Resting comfortably  at home.  Head is normocephalic, atraumatic.  No labored breathing.  Speech is clear and coherent with logical content.  Patient is alert and oriented at baseline.  Raspy voice Dry cough  Assessment and Plan:  Eugene Murray in today with chief complaint of Covid Positive   1. Positive self-administered antigen test for COVID-19 1. Take meds as prescribed 2. Use a cool mist humidifier especially during the winter months and when heat has been humid. 3. Use saline nose sprays frequently 4. Saline irrigations of the nose can be very helpful if done frequently.  * 4X daily for 1 week*  * Use of a  nettie pot can be helpful with this. Follow directions with this* 5. Drink plenty of fluids 6. Keep thermostat turn down low 7.For any cough or congestion- delsym or mucinex 8. For fever or aces or pains- take tylenol or ibuprofen appropriate for age and weight.  * for fevers greater than 101 orally you may alternate ibuprofen and tylenol every  3 hours.   Meds ordered this encounter  Medications   molnupiravir EUA (LAGEVRIO) 200 mg CAPS capsule    Sig: Take 4 capsules (800 mg total) by mouth 2 (two) times daily for 5 days.    Dispense:  40 capsule    Refill:  0    Order Specific Question:   Supervising Provider    Answer:   Noemi Chapel [[id number]]      Follow Up Instructions: I discussed the assessment and treatment plan with the patient. The patient was provided an  opportunity to ask questions and all were answered. The patient agreed with the plan and demonstrated an understanding of the instructions.  A copy of instructions were sent to the patient via MyChart.  The patient was advised to call back or seek an in-person evaluation if the symptoms worsen or if the condition fails to improve as anticipated.  Time:  I spent 13 minutes with the patient via telehealth technology discussing the above problems/concerns.    Mary-Margaret Hassell Done, FNP

## 2021-04-08 ENCOUNTER — Telehealth: Payer: Self-pay | Admitting: Cardiovascular Disease

## 2021-04-08 NOTE — Telephone Encounter (Signed)
[place] from [facility] was calling to check the status of her fax request sent to the office 04/03/21. She requested a copy of the patient's OV notes  from the last 6 months to get a new CPAP machine  Please fax notes to [phone]

## 2021-04-09 NOTE — Telephone Encounter (Signed)
Patient's 01/26/21 office note faxed to [place] with [employer] per her request.

## 2021-04-21 ENCOUNTER — Telehealth: Payer: Medicare Other

## 2021-05-05 ENCOUNTER — Other Ambulatory Visit: Payer: Self-pay

## 2021-05-05 MED ORDER — CLOPIDOGREL BISULFATE 75 MG PO TABS: 75.0000 mg | ORAL_TABLET | Freq: Every day | ORAL | 2 refills | Status: DC

## 2021-05-05 MED ORDER — AMLODIPINE BESYLATE 10 MG PO TABS: 10.0000 mg | ORAL_TABLET | Freq: Every day | ORAL | 3 refills | Status: DC

## 2021-06-20 ENCOUNTER — Other Ambulatory Visit: Payer: Self-pay | Admitting: Cardiovascular Disease

## 2021-08-02 ENCOUNTER — Other Ambulatory Visit: Payer: Self-pay | Admitting: Internal Medicine

## 2021-08-02 ENCOUNTER — Other Ambulatory Visit: Payer: Self-pay | Admitting: Cardiovascular Disease

## 2021-10-14 ENCOUNTER — Ambulatory Visit (INDEPENDENT_AMBULATORY_CARE_PROVIDER_SITE_OTHER): Payer: Medicare Other

## 2021-10-14 ENCOUNTER — Encounter: Payer: Self-pay | Admitting: Internal Medicine

## 2021-10-14 ENCOUNTER — Ambulatory Visit (INDEPENDENT_AMBULATORY_CARE_PROVIDER_SITE_OTHER): Payer: Medicare Other | Admitting: Internal Medicine

## 2021-10-14 VITALS — BP 120/80 | HR 56 | Temp 97.9°F | Ht 70.0 in | Wt 191.0 lb

## 2021-10-14 VITALS — BP 108/80 | HR 54 | Temp 97.9°F | Ht 70.0 in | Wt 191.0 lb

## 2021-10-14 DIAGNOSIS — R2689 Other abnormalities of gait and mobility: Secondary | ICD-10-CM

## 2021-10-14 DIAGNOSIS — I119 Hypertensive heart disease without heart failure: Secondary | ICD-10-CM

## 2021-10-14 DIAGNOSIS — I25118 Atherosclerotic heart disease of native coronary artery with other forms of angina pectoris: Secondary | ICD-10-CM

## 2021-10-14 DIAGNOSIS — E7800 Pure hypercholesterolemia, unspecified: Secondary | ICD-10-CM

## 2021-10-14 DIAGNOSIS — I10 Essential (primary) hypertension: Secondary | ICD-10-CM | POA: Diagnosis not present

## 2021-10-14 DIAGNOSIS — Z23 Encounter for immunization: Secondary | ICD-10-CM

## 2021-10-14 DIAGNOSIS — Z0000 Encounter for general adult medical examination without abnormal findings: Secondary | ICD-10-CM

## 2021-10-14 LAB — POCT URINALYSIS DIPSTICK
Bilirubin, UA: NEGATIVE
Glucose, UA: NEGATIVE
Ketones, UA: NEGATIVE
Leukocytes, UA: NEGATIVE
Nitrite, UA: NEGATIVE
Protein, UA: NEGATIVE
Spec Grav, UA: 1.025 (ref 1.010–1.025)
Urobilinogen, UA: 1 E.U./dL
pH, UA: 7 (ref 5.0–8.0)

## 2021-10-14 MED ORDER — EPINEPHRINE 0.3 MG/0.3ML IJ SOAJ: 0.3000 mg | INTRAMUSCULAR | 1 refills | Status: AC | PRN

## 2021-10-14 MED ORDER — LISINOPRIL 40 MG PO TABS: 40.0000 mg | ORAL_TABLET | Freq: Every day | ORAL | 3 refills | Status: DC

## 2021-10-14 NOTE — Progress Notes (Signed)
I,Eugene Murray,acting as a scribe for Eugene Greenland, MD.,have documented all relevant documentation on the behalf of Eugene Greenland, MD,as directed by  Eugene Greenland, MD while in the presence of Eugene Greenland, MD.    Subjective:     Patient ID: Eugene Murray , male    DOB: September 02, 1940 , 81 y.o.   MRN: [id number]   Chief Complaint  Patient presents with   Hypertension    HPI  He presents today for BP and cholesterol f/u. He reports compliance with meds. He denies headaches, palpitations and fatigue.   Over the past six months, he has been busy caring for his wife who was ill and previously hospitalized.  He has kept appts with specialists including Cardiology, which is great.  He was last seen by me a year ago; however, seen by Raman,NP in Sept 2022.   Hypertension This is a chronic problem. The current episode started more than 1 year ago. The problem has been gradually improving since onset. The problem is controlled. Pertinent negatives include no headaches. Risk factors for coronary artery disease include dyslipidemia and male gender. The current treatment provides moderate improvement. Hypertensive end-organ damage includes CAD/MI.  Hyperlipidemia This is a chronic problem. The current episode started more than 1 year ago. He has no history of diabetes. Current antihyperlipidemic treatment includes statins.     Past Medical History:  Diagnosis Date   Allergic rhinitis    Cancer ([place])    Prostate cancer   Coronary artery disease    History of prostate cancer    Hyperlipidemia    Hypertension    Hypokalemia    Nephritis    as a child   OSA (obstructive sleep apnea)    cpap machine at home   Stroke [facility])    "mini stroke" TIA     Family History  Problem Relation Age of Onset   Colon cancer Father        possibly age 65 y.o diagnosed    Stroke Mother    Asthma Sister      Current Outpatient Medications:    amLODipine (NORVASC) 10 MG tablet, Take 1  tablet (10 mg total) by mouth daily., Disp: 90 tablet, Rfl: 3   atorvastatin (LIPITOR) 40 MG tablet, TAKE 1 TABLET(40 MG) BY MOUTH DAILY, Disp: 30 tablet, Rfl: 10   Cholecalciferol (VITAMIN D) 2000 units CAPS, Take 2,000 Units by mouth daily., Disp: , Rfl:    clopidogrel (PLAVIX) 75 MG tablet, Take 1 tablet (75 mg total) by mouth daily., Disp: 90 tablet, Rfl: 2   hydrochlorothiazide (MICROZIDE) 12.5 MG capsule, TAKE 1 CAPSULE(12.5 MG) BY MOUTH DAILY, Disp: 90 capsule, Rfl: 2   isosorbide mononitrate (IMDUR) 30 MG 24 hr tablet, TAKE 1 TABLET(30 MG) BY MOUTH DAILY, Disp: 90 tablet, Rfl: 3   magnesium oxide (MAG-OX) 400 MG tablet, Take 400 mg by mouth daily., Disp: , Rfl:    metoprolol tartrate (LOPRESSOR) 50 MG tablet, TAKE 1 TABLET(50 MG) BY MOUTH TWICE DAILY, Disp: 180 tablet, Rfl: 2   nitroGLYCERIN (NITROSTAT) 0.4 MG SL tablet, Place 1 tablet (0.4 mg total) under the tongue every 5 (five) minutes as needed for chest pain., Disp: 25 tablet, Rfl: 2   EPINEPHrine (EPIPEN 2-PAK) 0.3 mg/0.3 mL IJ SOAJ injection, Inject 0.3 mg into the muscle as needed for anaphylaxis., Disp: 2 each, Rfl: 1   lisinopril (ZESTRIL) 40 MG tablet, Take 1 tablet (40 mg total) by mouth daily., Disp: 90 tablet, Rfl:  3   Allergies  Allergen Reactions   Other Hives    SEAFOOD   Penicillins Other (See Comments)    "caused me to pass out"   Sulfa Antibiotics Hives     Review of Systems  Constitutional: Negative.   HENT: Negative.    Eyes: Negative.   Respiratory: Negative.    Gastrointestinal: Negative.   Skin: Negative.   Neurological:  Positive for dizziness. Negative for headaches.       He states he has been experiencing dizziness intermittently for the past six months or so. He states sx last only a few seconds. Usually standing/walking when it happens. Sometimes he feels off balance.      Today's Vitals   10/14/21 0938 10/14/21 0945 10/14/21 0947  BP: 108/80 106/80 108/80  Pulse: (!) 49 (!) 52 (!) 54  Temp:  97.9 F (36.6 C)    SpO2: 98%    Weight: 191 lb (86.6 kg)    Height: 5' 10" (1.778 m)    PainSc: 0-No pain     Body mass index is 27.41 kg/m.  Wt Readings from Last 3 Encounters:  10/14/21 191 lb (86.6 kg)  10/14/21 191 lb (86.6 kg)  01/26/21 198 lb (89.8 kg)    Objective:  Physical Exam Vitals and nursing note reviewed.  Constitutional:      Appearance: Normal appearance.  HENT:     Head: Normocephalic and atraumatic.     Right Ear: Tympanic membrane, ear canal and external ear normal. There is no impacted cerumen.     Left Ear: Tympanic membrane, ear canal and external ear normal. There is no impacted cerumen.  Eyes:     Extraocular Movements: Extraocular movements intact.  Cardiovascular:     Rate and Rhythm: Normal rate and regular rhythm.     Heart sounds: Normal heart sounds.  Pulmonary:     Effort: Pulmonary effort is normal.     Breath sounds: Normal breath sounds.  Musculoskeletal:     Cervical back: Normal range of motion.  Skin:    General: Skin is warm.  Neurological:     General: No focal deficit present.     Mental Status: He is alert.  Psychiatric:        Mood and Affect: Mood normal.      Assessment And Plan:     1. Hypertensive heart disease without heart failure Comments: Chronic, well controlled. No need to change meds.  He will c/w amlodipine 10, metoprolol tartate 31m po bid, lisinopril 478mand HCTZ 12.9m53m- POCT Urinalysis Dipstick (81002) - Microalbumin / creatinine urine ratio - CMP14+EGFR - Lipid panel - CBC - TSH  2. Atherosclerosis of native coronary artery of native heart with other form of angina pectoris (HCCHaynevilleomments: Chronic, LDL goal <70. He should c/w Plavix and atorvastatin.   3. Imbalance Comments: Also with dizziness. He is not orthostatic. I will check vitamin b12 level. If normal, I will schedule MRI brain. - Vitamin B12  4. Pure hypercholesterolemia Comments: Chronic, current on atorvastatin. LDL goal <70, given  underlying CAD.  - TSH  5. Immunization due Comments: He was given Shingrix IM x 1, billed via [employer].  - Zoster Recombinant (Shingrix )   Patient was given opportunity to ask questions. Patient verbalized understanding of the plan and was able to repeat key elements of the plan. All questions were answered to their satisfaction.   I, RobMaximino GreenlandD, have reviewed all documentation for this visit. The documentation on  10/14/21 for the exam, diagnosis, procedures, and orders are all accurate and complete.   IF YOU HAVE BEEN REFERRED TO A SPECIALIST, IT MAY TAKE 1-2 WEEKS TO SCHEDULE/PROCESS THE REFERRAL. IF YOU HAVE NOT HEARD FROM US/SPECIALIST IN TWO WEEKS, PLEASE GIVE [place] A CALL AT [phone] X [phone].   THE PATIENT IS ENCOURAGED TO PRACTICE SOCIAL DISTANCING DUE TO THE COVID-19 PANDEMIC.

## 2021-10-14 NOTE — Progress Notes (Signed)
Subjective:   Eugene Murray is a 81 y.o. male who presents for Medicare Annual/Subsequent preventive examination.  Review of Systems     Cardiac Risk Factors include: advanced age (>41mn, >>30women);dyslipidemia;hypertension;male gender     Objective:    Today's Vitals   10/14/21 0944  BP: 120/80  Pulse: (!) 56  Temp: 97.9 F (36.6 C)  TempSrc: Oral  SpO2: 98%  Weight: 191 lb (86.6 kg)  Height: '5\' 10"'$  (1.778 m)   Body mass index is 27.41 kg/m.     10/14/2021    9:54 AM 09/11/2020   11:27 AM 08/30/2019   10:34 AM 08/29/2018   11:09 AM 03/19/2015    8:35 AM 08/21/2014    8:26 AM 06/30/2012   11:08 PM  Advanced Directives  Does Patient Have a Medical Advance Directive? No Yes No No No No Patient does not have advance directive;Patient would not like information  Type of APension scheme managerPower of ADanteLiving will       Copy of [place] Chart?  No - copy requested       Would patient like information on creating a medical advance directive? No - Patient declined  No - Patient declined No - Patient declined No - patient declined information Yes - Educational materials given     Current Medications (verified) Outpatient Encounter Medications as of 10/14/2021  Medication Sig   amLODipine (NORVASC) 10 MG tablet Take 1 tablet (10 mg total) by mouth daily.   atorvastatin (LIPITOR) 40 MG tablet TAKE 1 TABLET(40 MG) BY MOUTH DAILY   Cholecalciferol (VITAMIN D) 2000 units CAPS Take 2,000 Units by mouth daily.   clopidogrel (PLAVIX) 75 MG tablet Take 1 tablet (75 mg total) by mouth daily.   EPINEPHrine (EPIPEN 2-PAK) 0.3 mg/0.3 mL IJ SOAJ injection Inject 0.3 mLs (0.3 mg total) into the muscle as needed for anaphylaxis.   hydrochlorothiazide (MICROZIDE) 12.5 MG capsule TAKE 1 CAPSULE(12.5 MG) BY MOUTH DAILY   isosorbide mononitrate (IMDUR) 30 MG 24 hr tablet TAKE 1 TABLET(30 MG) BY MOUTH DAILY   lisinopril (ZESTRIL) 40 MG tablet TAKE 1 TABLET BY  MOUTH DAILY   magnesium oxide (MAG-OX) 400 MG tablet Take 400 mg by mouth daily.   metoprolol tartrate (LOPRESSOR) 50 MG tablet TAKE 1 TABLET(50 MG) BY MOUTH TWICE DAILY   nitroGLYCERIN (NITROSTAT) 0.4 MG SL tablet Place 1 tablet (0.4 mg total) under the tongue every 5 (five) minutes as needed for chest pain.   No facility-administered encounter medications on file as of 10/14/2021.    Allergies (verified) Other, Penicillins, and Sulfa antibiotics   History: Past Medical History:  Diagnosis Date   Allergic rhinitis    [place]    Prostate cancer   Coronary artery disease    History of prostate cancer    Hyperlipidemia    Hypertension    Hypokalemia    Nephritis    as a child   OSA (obstructive sleep apnea)    cpap machine at home   Stroke [facility]    "mini stroke" TIA   Past Surgical History:  Procedure Laterality Date   CARDIAC CATHETERIZATION     CAROTID STENT  11-08 and 4-11   stent x 3 (12/2006 x 2; 2011 x 1 stent)   CATARACT EXTRACTION W/PHACO Left 08/21/2014   Procedure: PHACO EMULSION CATARACT EXTRACTION AND INTRAOCULAR LENS PLACEMENT ([place] IMPLANT LEFT EYE;  Surgeon: RMarylynn Pearson MD;  [place] [facility]  Service: Ophthalmology;  Laterality: Left;   COLONOSCOPY  2015   COLONOSCOPY W/ POLYPECTOMY     CORONARY ANGIOPLASTY     HERNIA REPAIR Bilateral    x 2   different times   PROSTATE SURGERY     had cancer; radical resection 1997    Family History  Problem Relation Age of Onset   Colon cancer Father        possibly age 40 y.o diagnosed    Stroke Mother    Asthma Sister    Social History   Socioeconomic History   Marital status: Married    Spouse name: Hart Robinsons   Number of children: 3   Years of education: College   Highest education level: Not on file  Occupational History   Occupation: retired  Tobacco Use   Smoking status: Former    Years: 15.00    Types: Cigarettes    Quit date: 09/08/1985    Years since quitting: 36.1   Smokeless tobacco: Never   Vaping Use   Vaping Use: Never used  Substance and Sexual Activity   Alcohol use: Not Currently    Comment: seldom   Drug use: No   Sexual activity: Not Currently  Other Topics Concern   Not on file  Social History Narrative   3 kids    Retired Arboriculturist    Former smoker quit 1980s. Denies etOH, other drugs    Patient lives at home with family. Patient lives at home with his wife Hart Robinsons)   Caffeine Use: 1 glass of tea every other day   Social Determinants of Health   Financial Resource Strain: Low Risk  (10/14/2021)   Overall Financial Resource Strain (CARDIA)    Difficulty of Paying Living Expenses: Not hard at all  Food Insecurity: No Food Insecurity (10/14/2021)   Hunger Vital Sign    Worried About Running Out of Food in the Last Year: Never true    Ran Out of Food in the Last Year: Never true  Transportation Needs: No Transportation Needs (10/14/2021)   PRAPARE - Hydrologist (Medical): No    Lack of Transportation (Non-Medical): No  Physical Activity: Inactive (10/14/2021)   Exercise Vital Sign    Days of Exercise per Week: 0 days    Minutes of Exercise per Session: 0 min  Stress: Stress Concern Present (10/14/2021)   [place]    Feeling of Stress : To some extent  Social Connections: Not on file    Tobacco Counseling Counseling given: Not Answered   Clinical Intake:  Pre-visit preparation completed: Yes  Pain : No/denies pain     Nutritional Status: BMI 25 -29 Overweight Nutritional Risks: None Diabetes: No  How often do you need to have someone help you when you read instructions, pamphlets, or other written materials from your doctor or pharmacy?: 1 - Never What is the last grade level you completed in school?: 3 yrs college  Diabetic? no  Interpreter Needed?: No  Information entered by :: NAllen LPN   Activities of Daily Living    10/14/2021    9:55  AM  In your present state of health, do you have any difficulty performing the following activities:  Hearing? 0  Vision? 1  Comment some blurriness at times  Difficulty concentrating or making decisions? 1  Comment a little trouble remembering  Walking or climbing stairs? 1  Comment due to knee  Dressing or bathing? 0  Doing errands,  shopping? 0  Preparing Food and eating ? N  Using the Toilet? N  In the past six months, have you accidently leaked urine? Y  Do you have problems with loss of bowel control? Y  Comment slight leakage  Managing your Medications? N  Managing your Finances? N  Housekeeping or managing your Housekeeping? N    Patient Care Team: [name], MD as PCP - General (Internal Medicine) Troy Sine, MD as PCP - Cardiology (Cardiology) Rex Kras, Claudette Stapler, RN as Case Manager  Indicate any recent Medical Services you may have received from other than [facility] providers in the past year (date may be approximate).     Assessment:   This is a routine wellness examination for Jule.  Hearing/Vision screen Vision Screening - Comments:: Regular eye exams, Dr. Venetia Maxon  Dietary issues and exercise activities discussed: Current Exercise Habits: The patient does not participate in regular exercise at present   Goals Addressed             This Visit's Progress    Patient Stated       10/14/2021, no goals       Depression Screen    10/14/2021    9:55 AM 10/14/2021    9:38 AM 09/11/2020   11:29 AM 09/11/2020   10:38 AM 08/30/2019   10:35 AM 08/30/2019   10:13 AM 08/29/2018   11:11 AM  PHQ 2/9 Scores  PHQ - 2 Score 0 0 0 0 0 0 0  PHQ- 9 Score     0  0    Fall Risk    10/14/2021    9:55 AM 10/14/2021    9:38 AM 09/11/2020   11:28 AM 09/11/2020   10:35 AM 08/30/2019   10:34 AM  Fall Risk   Falls in the past year? 0 0 1 1 0  Number falls in past yr: 0 0 0 0   Injury with Fall? 0 0 0 0   Risk for fall due to : Medication side effect No Fall Risks  Medication side effect  Medication side effect  Follow up Falls evaluation completed;Education provided;Falls prevention discussed Falls evaluation completed Falls evaluation completed;Education provided;Falls prevention discussed  Falls evaluation completed;Education provided;Falls prevention discussed    FALL RISK PREVENTION PERTAINING TO THE HOME:  Any stairs in or around the home? Yes  If so, are there any without handrails? No  Home free of loose throw rugs in walkways, pet beds, electrical cords, etc? Yes  Adequate lighting in your home to reduce risk of falls? Yes   ASSISTIVE DEVICES UTILIZED TO PREVENT FALLS:  Life alert? No  Use of a cane, walker or w/c? No  Grab bars in the bathroom? Yes  Shower chair or bench in shower? No  Elevated toilet seat or a handicapped toilet? Yes   TIMED UP AND GO:  Was the test performed? No .    Gait steady and fast without use of assistive device  Cognitive Function:        10/14/2021    9:58 AM 09/11/2020   11:32 AM 08/30/2019   10:38 AM 08/29/2018   11:15 AM  6CIT Screen  What Year? 0 points 0 points 0 points 0 points  What month? 0 points 0 points 0 points 0 points  What time? 0 points 0 points 0 points 0 points  Count back from 20 0 points 0 points 0 points 0 points  Months in reverse 0 points 0 points 0 points 0 points  Repeat phrase 0 points 4 points 0 points 0 points  Total Score 0 points 4 points 0 points 0 points    Immunizations Immunization History  Administered Date(s) Administered   Fluad Quad(high Dose 65+) 03/05/2020   Influenza, High Dose Seasonal PF 02/27/2019   Influenza,inj,Quad PF,6+ Mos 12/05/2012, 12/06/2013, 02/10/2015, 02/10/2016   PFIZER(Purple Top)SARS-COV-2 Vaccination 04/06/2019, 04/27/2019, 12/26/2019   Pneumococcal Polysaccharide-23 02/20/2019   Zoster, Live 01/10/2013    TDAP status: Up to date  Flu Vaccine status: Due, Education has been provided regarding the importance of this vaccine.  Advised may receive this vaccine at local pharmacy or Health Dept. Aware to provide a copy of the vaccination record if obtained from local pharmacy or Health Dept. Verbalized acceptance and understanding.  Pneumococcal vaccine status: Due, Education has been provided regarding the importance of this vaccine. Advised may receive this vaccine at local pharmacy or Health Dept. Aware to provide a copy of the vaccination record if obtained from local pharmacy or Health Dept. Verbalized acceptance and understanding.  Covid-19 vaccine status: Completed vaccines  Qualifies for Shingles Vaccine? Yes   Zostavax completed Yes   Shingrix Completed?: first dose today  Screening Tests Health Maintenance  Topic Date Due   Zoster Vaccines- Shingrix (1 of 2) Never done   Pneumonia Vaccine 9+ Years old (2 - PCV) 02/20/2020   COVID-19 Vaccine (4 - Pfizer series) 02/20/2020   INFLUENZA VACCINE  09/29/2021   TETANUS/TDAP  04/01/2026   HPV VACCINES  Aged Out    Health Maintenance  Health Maintenance Due  Topic Date Due   Zoster Vaccines- Shingrix (1 of 2) Never done   Pneumonia Vaccine 49+ Years old (2 - PCV) 02/20/2020   COVID-19 Vaccine (4 - Pfizer series) 02/20/2020   INFLUENZA VACCINE  09/29/2021    Colorectal cancer screening: No longer required.   Lung Cancer Screening: (Low Dose CT Chest recommended if Age 55-80 years, 30 pack-year currently smoking OR have quit w/in 15years.) does not qualify.   Lung Cancer Screening Referral: no  Additional Screening:  Hepatitis C Screening: does not qualify;   Vision Screening: Recommended annual ophthalmology exams for early detection of glaucoma and other disorders of the eye. Is the patient up to date with their annual eye exam?  Yes  Who is the provider or what is the name of the office in which the patient attends annual eye exams? Dr. Venetia Maxon If pt is not established with a provider, would they like to be referred to a provider to establish  care? No .   Dental Screening: Recommended annual dental exams for proper oral hygiene  Community Resource Referral / Chronic Care Management: CRR required this visit?  No   CCM required this visit?  No      Plan:     I have personally reviewed and noted the following in the patient's chart:   Medical and social history Use of alcohol, tobacco or illicit drugs  Current medications and supplements including opioid prescriptions. Patient is not currently taking opioid prescriptions. Functional ability and status Nutritional status Physical activity Advanced directives List of other physicians Hospitalizations, surgeries, and ER visits in previous 12 months Vitals Screenings to include cognitive, depression, and falls Referrals and appointments  In addition, I have reviewed and discussed with patient certain preventive protocols, quality metrics, and best practice recommendations. A written personalized care plan for preventive services as well as general preventive health recommendations were provided to patient.     Kellie Simmering, LPN  10/14/2021   Nurse Notes: none

## 2021-10-14 NOTE — Patient Instructions (Signed)
Eugene Murray , Thank you for taking time to come for your Medicare Wellness Visit. I appreciate your ongoing commitment to your health goals. Please review the following plan we discussed and let me know if I can assist you in the future.   Screening recommendations/referrals: Colonoscopy: not required Recommended yearly ophthalmology/optometry visit for glaucoma screening and checkup Recommended yearly dental visit for hygiene and checkup  Vaccinations: Influenza vaccine: due Pneumococcal vaccine: due Tdap vaccine: completed 04/01/2016, due 04/01/2026 Shingles vaccine: first dose today   Covid-19:  12/26/2019, 04/27/2019, 04/06/2019  Advanced directives: Advance directive discussed with you today. Even though you declined this today please call our office should you change your mind and we can give you the proper paperwork for you to fill out.  Conditions/risks identified: none  Next appointment: Follow up in one year for your annual wellness visit.   Preventive Care 81 Years and Older, Male Preventive care refers to lifestyle choices and visits with your health care provider that can promote health and wellness. What does preventive care include? A yearly physical exam. This is also called an annual well check. Dental exams once or twice a year. Routine eye exams. Ask your health care provider how often you should have your eyes checked. Personal lifestyle choices, including: Daily care of your teeth and gums. Regular physical activity. Eating a healthy diet. Avoiding tobacco and drug use. Limiting alcohol use. Practicing safe sex. Taking low doses of aspirin every day. Taking vitamin and mineral supplements as recommended by your health care provider. What happens during an annual well check? The services and screenings done by your health care provider during your annual well check will depend on your age, overall health, lifestyle risk factors, and family history of  disease. Counseling  Your health care provider may ask you questions about your: Alcohol use. Tobacco use. Drug use. Emotional well-being. Home and relationship well-being. Sexual activity. Eating habits. History of falls. Memory and ability to understand (cognition). Work and work Statistician. Screening  You may have the following tests or measurements: Height, weight, and BMI. Blood pressure. Lipid and cholesterol levels. These may be checked every 5 years, or more frequently if you are over 81 years old. Skin check. Lung cancer screening. You may have this screening every year starting at age 81 if you have a 30-pack-year history of smoking and currently smoke or have quit within the past 15 years. Fecal occult blood test (FOBT) of the stool. You may have this test every year starting at age 81. Flexible sigmoidoscopy or colonoscopy. You may have a sigmoidoscopy every 5 years or a colonoscopy every 10 years starting at age 81. Prostate cancer screening. Recommendations will vary depending on your family history and other risks. Hepatitis C blood test. Hepatitis B blood test. Sexually transmitted disease (STD) testing. Diabetes screening. This is done by checking your blood sugar (glucose) after you have not eaten for a while (fasting). You may have this done every 1-3 years. Abdominal aortic aneurysm (AAA) screening. You may need this if you are a current or former smoker. Osteoporosis. You may be screened starting at age 25 if you are at high risk. Talk with your health care provider about your test results, treatment options, and if necessary, the need for more tests. Vaccines  Your health care provider may recommend certain vaccines, such as: Influenza vaccine. This is recommended every year. Tetanus, diphtheria, and acellular pertussis (Tdap, Td) vaccine. You may need a Td booster every 10 years. Zoster vaccine.  You may need this after age 21. Pneumococcal 13-valent  conjugate (PCV13) vaccine. One dose is recommended after age 8. Pneumococcal polysaccharide (PPSV23) vaccine. One dose is recommended after age 45. Talk to your health care provider about which screenings and vaccines you need and how often you need them. This information is not intended to replace advice given to you by your health care provider. Make sure you discuss any questions you have with your health care provider. Document Released: 03/14/2015 Document Revised: 11/05/2015 Document Reviewed: 12/17/2014 Elsevier Interactive Patient Education  2017 [place] Prevention in the Home Falls can cause injuries. They can happen to people of all ages. There are many things you can do to make your home safe and to help prevent falls. What can I do on the outside of my home? Regularly fix the edges of walkways and driveways and fix any cracks. Remove anything that might make you trip as you walk through a door, such as a raised step or threshold. Trim any bushes or trees on the path to your home. Use bright outdoor lighting. Clear any walking paths of anything that might make someone trip, such as rocks or tools. Regularly check to see if handrails are loose or broken. Make sure that both sides of any steps have handrails. Any raised decks and porches should have guardrails on the edges. Have any leaves, snow, or ice cleared regularly. Use sand or salt on walking paths during winter. Clean up any spills in your garage right away. This includes oil or grease spills. What can I do in the bathroom? Use night lights. Install grab bars by the toilet and in the tub and shower. Do not use towel bars as grab bars. Use non-skid mats or decals in the tub or shower. If you need to sit down in the shower, use a plastic, non-slip stool. Keep the floor dry. Clean up any water that spills on the floor as soon as it happens. Remove soap buildup in the tub or shower regularly. Attach bath mats  securely with double-sided non-slip rug tape. Do not have throw rugs and other things on the floor that can make you trip. What can I do in the bedroom? Use night lights. Make sure that you have a light by your bed that is easy to reach. Do not use any sheets or blankets that are too big for your bed. They should not hang down onto the floor. Have a firm chair that has side arms. You can use this for support while you get dressed. Do not have throw rugs and other things on the floor that can make you trip. What can I do in the kitchen? Clean up any spills right away. Avoid walking on wet floors. Keep items that you use a lot in easy-to-reach places. If you need to reach something above you, use a strong step stool that has a grab bar. Keep electrical cords out of the way. Do not use floor polish or wax that makes floors slippery. If you must use wax, use non-skid floor wax. Do not have throw rugs and other things on the floor that can make you trip. What can I do with my stairs? Do not leave any items on the stairs. Make sure that there are handrails on both sides of the stairs and use them. Fix handrails that are broken or loose. Make sure that handrails are as long as the stairways. Check any carpeting to make sure that it is  firmly attached to the stairs. Fix any carpet that is loose or worn. Avoid having throw rugs at the top or bottom of the stairs. If you do have throw rugs, attach them to the floor with carpet tape. Make sure that you have a light switch at the top of the stairs and the bottom of the stairs. If you do not have them, ask someone to add them for you. What else can I do to help prevent falls? Wear shoes that: Do not have high heels. Have rubber bottoms. Are comfortable and fit you well. Are closed at the toe. Do not wear sandals. If you use a stepladder: Make sure that it is fully opened. Do not climb a closed stepladder. Make sure that both sides of the stepladder  are locked into place. Ask someone to hold it for you, if possible. Clearly mark and make sure that you can see: Any grab bars or handrails. First and last steps. Where the edge of each step is. Use tools that help you move around (mobility aids) if they are needed. These include: Canes. Walkers. Scooters. Crutches. Turn on the lights when you go into a dark area. Replace any light bulbs as soon as they burn out. Set up your furniture so you have a clear path. Avoid moving your furniture around. If any of your floors are uneven, fix them. If there are any pets around you, be aware of where they are. Review your medicines with your doctor. Some medicines can make you feel dizzy. This can increase your chance of falling. Ask your doctor what other things that you can do to help prevent falls. This information is not intended to replace advice given to you by your health care provider. Make sure you discuss any questions you have with your health care provider. Document Released: 12/12/2008 Document Revised: 07/24/2015 Document Reviewed: 03/22/2014 Elsevier Interactive Patient Education  2017 [employer].

## 2021-10-15 LAB — LIPID PANEL
Chol/HDL Ratio: 2.7 ratio (ref 0.0–5.0)
Cholesterol, Total: 112 mg/dL (ref 100–199)
HDL: 41 mg/dL (ref >39)
LDL Chol Calc (NIH): 59 mg/dL (ref 0–99)
Triglycerides: 49 mg/dL (ref 0–149)
VLDL Cholesterol Cal: 12 mg/dL (ref 5–40)

## 2021-10-15 LAB — CMP14+EGFR
ALT: 15 IU/L (ref 0–44)
AST: 17 IU/L (ref 0–40)
Albumin/Globulin Ratio: 1.6 (ref 1.2–2.2)
Albumin: 3.8 g/dL (ref 3.7–4.7)
Alkaline Phosphatase: 64 IU/L (ref 44–121)
BUN/Creatinine Ratio: 14 (ref 10–24)
BUN: 13 mg/dL (ref 8–27)
Bilirubin Total: 0.7 mg/dL (ref 0.0–1.2)
CO2: 24 mmol/L (ref 20–29)
Calcium: 8.9 mg/dL (ref 8.6–10.2)
Chloride: 104 mmol/L (ref 96–106)
Creatinine, Ser: 0.93 mg/dL (ref 0.76–1.27)
Globulin, Total: 2.4 g/dL (ref 1.5–4.5)
Glucose: 94 mg/dL (ref 70–99)
Potassium: 3.7 mmol/L (ref 3.5–5.2)
Sodium: 143 mmol/L (ref 134–144)
Total Protein: 6.2 g/dL (ref 6.0–8.5)
eGFR: 82 mL/min/{1.73_m2} (ref >59)

## 2021-10-15 LAB — MICROALBUMIN / CREATININE URINE RATIO
Creatinine, Urine: 186.6 mg/dL
Microalb/Creat Ratio: 4 mg/g creat (ref 0–29)
Microalbumin, Urine: 7.4 ug/mL

## 2021-10-15 LAB — CBC
Hematocrit: 38.7 % (ref 37.5–51.0)
Hemoglobin: 13.3 g/dL (ref 13.0–17.7)
MCH: 30.8 pg (ref 26.6–33.0)
MCHC: 34.4 g/dL (ref 31.5–35.7)
MCV: 90 fL (ref 79–97)
Platelets: 126 10*3/uL — ABNORMAL LOW (ref 150–450)
RBC: 4.32 x10E6/uL (ref 4.14–5.80)
RDW: 12.9 % (ref 11.6–15.4)
WBC: 4.9 10*3/uL (ref 3.4–10.8)

## 2021-10-15 LAB — VITAMIN B12: Vitamin B-12: 396 pg/mL (ref 232–1245)

## 2021-10-15 LAB — TSH: TSH: 0.473 u[IU]/mL (ref 0.450–4.500)

## 2021-10-19 ENCOUNTER — Other Ambulatory Visit: Payer: Self-pay | Admitting: Internal Medicine

## 2021-10-19 NOTE — Progress Notes (Signed)
This encounter was created in error - please disregard.

## 2021-11-05 ENCOUNTER — Ambulatory Visit: Payer: Self-pay

## 2021-11-05 NOTE — Patient Instructions (Addendum)
Visit Information  Thank you for taking time to visit with me today. Please don't hesitate to contact me if I can be of assistance to you.   Following are the goals we discussed today:   Goals Addressed       Patient Stated     I sometimes feel a Eugene Murray off balance (pt-stated)        Care Coordination Interventions: Evaluation of current treatment plan related to hypertension self management and patient's adherence to plan as established by provider Advised patient, providing education and rationale, to monitor blood pressure daily and record, calling PCP for findings outside established parameters Instructed patient to monitor blood pressure 3 times weekly and record readings for review and discussion at next scheduled RN CC call  Educated patient regarding target BP <130/80 Educated patient regarding symptoms suggestive of low blood pressure and when to call the doctor if needed Instructed patient with rationale to take his time when ambulating and with position change Collaborated with PCP regarding referral for brain MRI as discussed with patient during last scheduled visit, Dr. Baird Cancer will consider and have office contact patient     Our next appointment is by telephone on 11/19/21 at 10:15 AM  Please call the care guide team at [phone] if you need to cancel or reschedule your appointment.   If you are experiencing a Mental Health or [place] or need someone to talk to, please call 1-800-273-TALK (toll free, 24 hour hotline)  Patient verbalizes understanding of instructions and care plan provided today and agrees to view in [place]. Active MyChart status and patient understanding of how to access instructions and care plan via MyChart confirmed with patient.     Barb Merino, RN, BSN, CCM Care Management Coordinator [facility] Care Management  Direct Phone: [phone]

## 2021-11-05 NOTE — Patient Outreach (Signed)
  Care Coordination   Follow Up Visit Note   11/05/2021 Name: Eugene Murray MRN: [id number] DOB: 11-Feb-1941  Eugene Murray is a 81 y.o. year old male who sees [name], MD for primary care. I spoke with  Eugene Murray by phone today.  What matters to the patients health and wellness today?  Patient is feeling a Eugene Murray off balance at times.    Goals Addressed       Patient Stated     I sometimes feel a Eugene Murray off balance (pt-stated)        Care Coordination Interventions: Evaluation of current treatment plan related to hypertension self management and patient's adherence to plan as established by provider Advised patient, providing education and rationale, to monitor blood pressure daily and record, calling PCP for findings outside established parameters Instructed patient to monitor blood pressure 3 times weekly and record readings for review and discussion at next scheduled RN CC call  Educated patient regarding target BP <130/80 Educated patient regarding symptoms suggestive of low blood pressure and when to call the doctor if needed Instructed patient with rationale to take his time when ambulating and with position change Collaborated with PCP regarding referral for brain MRI as discussed with patient during last scheduled visit, Dr. Baird Cancer will consider and have office contact patient          SDOH assessments and interventions completed:  No     Care Coordination Interventions Activated:  Yes  Care Coordination Interventions:  Yes, provided   Follow up plan: Follow up call scheduled for 11/19/21 '@10'$ :15 AM    Encounter Outcome:  Pt. Visit Completed

## 2021-11-10 ENCOUNTER — Ambulatory Visit (INDEPENDENT_AMBULATORY_CARE_PROVIDER_SITE_OTHER): Payer: Medicare Other

## 2021-11-10 VITALS — BP 132/76 | HR 54 | Temp 98.6°F | Ht 70.0 in | Wt 191.0 lb

## 2021-11-10 DIAGNOSIS — Z23 Encounter for immunization: Secondary | ICD-10-CM

## 2021-11-10 NOTE — Progress Notes (Signed)
Patient presents today for a pneumonia 20 vaccine.

## 2021-11-11 ENCOUNTER — Other Ambulatory Visit: Payer: Self-pay | Admitting: Internal Medicine

## 2021-11-11 DIAGNOSIS — R42 Dizziness and giddiness: Secondary | ICD-10-CM

## 2021-11-11 DIAGNOSIS — R2689 Other abnormalities of gait and mobility: Secondary | ICD-10-CM

## 2021-11-14 ENCOUNTER — Other Ambulatory Visit: Payer: Self-pay | Admitting: Cardiovascular Disease

## 2021-11-19 ENCOUNTER — Ambulatory Visit: Payer: Self-pay

## 2021-11-19 NOTE — Patient Instructions (Signed)
Visit Information  Thank you for taking time to visit with me today. Please don't hesitate to contact me if I can be of assistance to you.   Following are the goals we discussed today:   Goals Addressed               This Visit's Progress     Patient Stated     I sometimes feel a Laddie Math off balance (pt-stated)        Care Coordination Interventions: Evaluation of current treatment plan related to hypertension self management and patient's adherence to plan as established by provider Determined patient is monitoring his BP and recording his readings as directed, he will send readings via my chart, states BP "has been pretty good" Instructed patient to continue to self monitor and report concerns or abnormal findings to PCP Discussed patient is scheduled for brain MRI on 11/26/21 '@10'$ :10 AM        Our next appointment is by telephone on 12/10/21 at 10:15 AM   Please call the care guide team at [phone] if you need to cancel or reschedule your appointment.   If you are experiencing a Mental Health or [place] or need someone to talk to, please go to [facility] [address] [place] [phone])  Patient verbalizes understanding of instructions and care plan provided today and agrees to view in [place]. Active MyChart status and patient understanding of how to access instructions and care plan via MyChart confirmed with patient.     Barb Merino, RN, BSN, CCM Care Management Coordinator [facility] Direct Phone: [phone]

## 2021-11-19 NOTE — Patient Outreach (Signed)
  Care Coordination   Follow Up Visit Note   11/19/2021 Name: Eugene Murray MRN: [id number] DOB: [birth year]  Eugene Murray is a 81 y.o. year old male who sees Eugene Chard, MD for primary care. I spoke with  Eugene Murray by phone today.  What matters to the patients health and wellness today?  Patient is keeping a close eye on his BP by self checking his BP 3 times weekly and recording his readings.     Goals Addressed               This Visit's Progress     Patient Stated     I sometimes feel a Eugene Murray off balance (pt-stated)        Care Coordination Interventions: Evaluation of current treatment plan related to hypertension self management and patient's adherence to plan as established by provider Determined patient is monitoring his BP and recording his readings as directed, he will send readings via my chart, states BP "has been pretty good" Instructed patient to continue to self monitor and report concerns or abnormal findings to PCP Discussed patient is scheduled for brain MRI on 11/26/21 '@10'$ :10 AM        SDOH assessments and interventions completed:  No     Care Coordination Interventions Activated:  Yes  Care Coordination Interventions:  Yes, provided   Follow up plan: Follow up call scheduled for 12/10/21 '@10'$ :15 AM    Encounter Outcome:  Pt. Visit Completed

## 2021-11-19 NOTE — Progress Notes (Signed)
This encounter was created in error - please disregard.

## 2021-11-25 ENCOUNTER — Other Ambulatory Visit (INDEPENDENT_AMBULATORY_CARE_PROVIDER_SITE_OTHER): Payer: Medicare Other

## 2021-11-25 ENCOUNTER — Encounter: Payer: Self-pay | Admitting: Internal Medicine

## 2021-11-25 DIAGNOSIS — R319 Hematuria, unspecified: Secondary | ICD-10-CM | POA: Diagnosis not present

## 2021-11-25 LAB — POCT URINALYSIS DIPSTICK
Bilirubin, UA: NEGATIVE
Blood, UA: NEGATIVE
Glucose, UA: NEGATIVE
Ketones, UA: NEGATIVE
Leukocytes, UA: NEGATIVE
Nitrite, UA: NEGATIVE
Protein, UA: NEGATIVE
Spec Grav, UA: 1.015 (ref 1.010–1.025)
Urobilinogen, UA: 0.2 E.U./dL
pH, UA: 7 (ref 5.0–8.0)

## 2021-11-26 ENCOUNTER — Telehealth: Payer: Self-pay

## 2021-11-26 ENCOUNTER — Ambulatory Visit
Admission: RE | Admit: 2021-11-26 | Discharge: 2021-11-26 | Disposition: A | Discharge status: Home / Self Care | Payer: Medicare Other | Source: Ambulatory Visit | Attending: Internal Medicine | Admitting: Internal Medicine

## 2021-11-26 DIAGNOSIS — R2689 Other abnormalities of gait and mobility: Secondary | ICD-10-CM

## 2021-11-26 DIAGNOSIS — R42 Dizziness and giddiness: Secondary | ICD-10-CM

## 2021-11-26 NOTE — Telephone Encounter (Signed)
Patient submitted his bp readings to Dr.Sanders. she recommends that he has a 2 week nurse visit bp check. She also advised him to cut out salty foods including bacon,sausage,and deli meats.  I have left him a vm to call the office to notify him of his appt. YL,RMA

## 2021-11-27 ENCOUNTER — Other Ambulatory Visit: Payer: Self-pay | Admitting: Internal Medicine

## 2021-11-27 DIAGNOSIS — R930 Abnormal findings on diagnostic imaging of skull and head, not elsewhere classified: Secondary | ICD-10-CM

## 2021-11-27 DIAGNOSIS — I25118 Atherosclerotic heart disease of native coronary artery with other forms of angina pectoris: Secondary | ICD-10-CM

## 2021-11-27 DIAGNOSIS — R42 Dizziness and giddiness: Secondary | ICD-10-CM

## 2021-12-08 ENCOUNTER — Ambulatory Visit (INDEPENDENT_AMBULATORY_CARE_PROVIDER_SITE_OTHER): Payer: Medicare Other

## 2021-12-08 VITALS — BP 110/62 | HR 61 | Temp 97.7°F

## 2021-12-08 DIAGNOSIS — R350 Frequency of micturition: Secondary | ICD-10-CM | POA: Diagnosis not present

## 2021-12-08 LAB — POCT URINALYSIS DIPSTICK
Blood, UA: NEGATIVE
Glucose, UA: NEGATIVE
Leukocytes, UA: NEGATIVE
Nitrite, UA: NEGATIVE
Protein, UA: POSITIVE — AB
Spec Grav, UA: >=1.03 — AB (ref 1.010–1.025)
Urobilinogen, UA: 0.2 E.U./dL
pH, UA: 6 (ref 5.0–8.0)

## 2021-12-08 NOTE — Progress Notes (Signed)
Patient presents today for BPC. He is currently taking amlodipine '10mg'$ , hctz 12.5 imdur '30mg'$  lisinoprol '40mg'$  metoprolol '50mg'$ . He also takes magnesium and plavix.  BP Readings from Last 3 Encounters:  12/08/21 110/62  11/10/21 132/76  10/14/21 108/80

## 2021-12-10 ENCOUNTER — Ambulatory Visit: Payer: Self-pay

## 2021-12-10 NOTE — Patient Outreach (Signed)
  Care Coordination   Follow Up Visit Note   12/10/2021 Name: DAKHARI ZUVER MRN: [id number] DOB: 02-Apr-1940  Janine Ores Messler is a 81 y.o. year old male who sees [name], MD for primary care. I spoke with  [name] by phone today.  What matters to the patients health and wellness today?  Patient will continue to monitor her BP at home. He will f/u with Neurologist to review his abnormal brain MRI.     Goals Addressed               This Visit's Progress     Patient Stated     I sometimes feel a Adaley Kiene off balance (pt-stated)        Care Coordination Interventions: Evaluation of current treatment plan related to hypertension self management and patient's adherence to plan as established by provider Determined patient is monitoring his BP and recording his readings as directed, he reports completing a recent nurse visit, with BP of 110/62 Determined patient continues to see higher readings with his home BP cuff, determined his cuff is approximately 81 years old Collaborated with the [facility] admin staff, requested a new BP cuff be mailed to patient's home address and this has been completed, patient advised  Instructed patient to continue to check his BP at home daily at different times of the day and record, alert PCP of abnormal readings or concerns Reviewed scheduled/upcoming provider appointment including: new patient follow up with Neurology for evaluation of abnormal brain MRI Educated patient regarding small vessel ischemic disease to the brain         SDOH assessments and interventions completed:  No     Care Coordination Interventions Activated:  Yes  Care Coordination Interventions:  Yes, provided   Follow up plan: Follow up call scheduled for 01/07/22 '@1130'$  AM     Encounter Outcome:  Pt. Visit Completed

## 2021-12-10 NOTE — Patient Instructions (Signed)
Visit Information  Thank you for taking time to visit with me today. Please don't hesitate to contact me if I can be of assistance to you.   Following are the goals we discussed today:   Goals Addressed               This Visit's Progress     Patient Stated     I sometimes feel a Rielle Schlauch off balance (pt-stated)        Care Coordination Interventions: Evaluation of current treatment plan related to hypertension self management and patient's adherence to plan as established by provider Determined patient is monitoring his BP and recording his readings as directed, he reports completing a recent nurse visit, with BP of 110/62 Determined patient continues to see higher readings with his home BP cuff, determined his cuff is approximately 81 years old Collaborated with the [facility] admin staff, requested a new BP cuff be mailed to patient's home address and this has been completed, patient advised  Instructed patient to continue to check his BP at home daily at different times of the day and record, alert PCP of abnormal readings or concerns Reviewed scheduled/upcoming provider appointment including: new patient follow up with Neurology for evaluation of abnormal brain MRI Educated patient regarding small vessel ischemic disease to the brain         Our next appointment is by telephone on 01/07/22 at 1130AM  Please call the care guide team at [phone] if you need to cancel or reschedule your appointment.   If you are experiencing a Mental Health or [place] or need someone to talk to, please call 1-800-273-TALK (toll free, 24 hour hotline) go to [facility] [address], [place] [phone])  Patient verbalizes understanding of instructions and care plan provided today and agrees to view in [place]. Active MyChart status and patient understanding of how to access instructions and care plan via MyChart confirmed with patient.      Barb Merino, RN, BSN, CCM Care Management Coordinator [facility]  Direct Phone: [phone]

## 2021-12-14 ENCOUNTER — Encounter: Payer: Self-pay | Admitting: Neurology

## 2021-12-14 ENCOUNTER — Ambulatory Visit (INDEPENDENT_AMBULATORY_CARE_PROVIDER_SITE_OTHER): Payer: Medicare Other | Admitting: Neurology

## 2021-12-14 VITALS — BP 114/63 | HR 52 | Ht 70.75 in | Wt 195.0 lb

## 2021-12-14 DIAGNOSIS — I69993 Ataxia following unspecified cerebrovascular disease: Secondary | ICD-10-CM

## 2021-12-14 DIAGNOSIS — E7489 Other specified disorders of carbohydrate metabolism: Secondary | ICD-10-CM

## 2021-12-14 DIAGNOSIS — Z8673 Personal history of transient ischemic attack (TIA), and cerebral infarction without residual deficits: Secondary | ICD-10-CM

## 2021-12-14 DIAGNOSIS — R42 Dizziness and giddiness: Secondary | ICD-10-CM | POA: Diagnosis not present

## 2021-12-14 DIAGNOSIS — H814 Vertigo of central origin: Secondary | ICD-10-CM | POA: Diagnosis not present

## 2021-12-14 DIAGNOSIS — I6781 Acute cerebrovascular insufficiency: Secondary | ICD-10-CM | POA: Diagnosis not present

## 2021-12-14 DIAGNOSIS — I25118 Atherosclerotic heart disease of native coronary artery with other forms of angina pectoris: Secondary | ICD-10-CM

## 2021-12-14 NOTE — Patient Instructions (Signed)
I had a long discussion with the patient regarding his symptoms of intermittent dizziness of unclear etiology.  Recommend close monitoring of his blood pressure if it continues to be on the lower side follow-up with primary care physician.  Will consider lowering blood pressure medications.  Check MR angiogram of the brain and neck to rule out vertebral basilar occlusive disease.  Continue Plavix for stroke prevention with aggressive risk factor modification with strict control of hypertension with blood pressure goal below 140/90, lipids with LDL cholesterol goal below percent and diabetes with hemoglobin A1c goal below 0.5%.  I also encouraged him to maintain adequate hydration and drink to 10 glasses of liquids/fluids today return for follow-up in the future in 3 months with my nurse practitioner or call earlier if necessary.

## 2021-12-14 NOTE — Progress Notes (Signed)
[facility] [address] [place]. [place] [address] [phone]       OFFICE CONSULT NOTE  [name] Date of Birth:  31-Dec-1940 Medical Record Number:  [id number]   Referring MD: Eugene Murray  Reason for Referral: Dizziness  HPI: Eugene Murray is a 81 year old pleasant initial office consultation visit for.  History is obtained from the patient review of electronic medical records and I personally reviewed pertinent available imaging films and PACS.  He has past medical history of hypertension, hyperlipidemia, obstructive sleep apnea, coronary artery disease and a brainstem infarct in 2014.  Patient states for the last few months he has noticed remittent dizziness.  He describes this as a feeling of imbalance and leaning to 1 side for quite transient and if he holds onto something and waits for the feeling usually passes away fairly quickly.  He denies true vertigo, diplopia, dysarthria, extremity weakness he is unable to identify specific triggers for these episodes.  He states when he gets up in the morning a little more unsteady on his feet.  He saw his primary care physician who advised him to do some orthostatic tolerance exercises which he has been doing.  He states his blood pressure at home runs in the 176H systolic range known to be consistent.  Denies any tingling numbness or burning in his feet.  Frequent falls or injuries he has a prior history of brainstem infarction May 2014.  He was seen by me in the office on several occasions last visit being on 04/02/2016 with Eugene Courier, NP.  Patient recently had MRI scan of the brain on 11/26/2021 which showed progressive changes of chronic small vessel disease without any acute abnormalities. Prior visit 04/02/2014 ;Eugene Murray is a 81 year old AA male who been having recurrent stereotypical episodes of right-sided weakness, heaviness and a strange feeling on the roof of the mouth on the right. These have been occurring for the  last several weeks But have increased in last 1 week to 2-3 times a day He was admitted to [facility] on 06/30/12 with similar symptoms which had been ongoing off and on for one week. He had noticed some difficulty speaking as well. The symptoms lasted 10-15 minutes with a maximum episode lasting 20 minutes. CT scan was unremarkable. An MRI scan of the brain showed a equivocal tiny acute left pontomedullary junction infarct. MRA of the brain showed mild intracranial atherosclerotic changes. 2DEcho showed normal ejection fraction. Carotid Doppler showed no significant extracranial stenosis. Patient was brought to have a brainstem infarct due to small vessel disease and recurrent symptoms and was advised to add aspirin to his Plavix because of prior history of drug coated cardiac stents. Lipid profile was at goal. Patient states that his symptoms have persisted and despite adding aspirin and he's been having these now to 3 times a day. He does feel just symptoms are stereo typical and he gets an aura with a warning which is a strange feeling on the roof of his mouth on the inside of the right side. This is followed by a feeling of heaviness on the right side. He feels tired and needs to rest. This is followed by a mild headache which lasted for for A few hours. He denies known history of chronic migraines but does states that a few years ago he did have couple of episodes of transient visual distortions which were felt to be migraine variant. He does have some intermittent headaches off and on but  he didn't feel he needed to go to the emergency room or take strong medications for them. He does not describe specific triggers for these episodes. He does have history of sleep apnea and does use a CPAP which was last titrated a year ago and he feels it seems to be working fine. He does also complain of chronic low back pain with some recent increase in pain and some intermittent right leg weakness and ankle giving  out. He did have epidural steroid injections in the past but and has no history of back surgery. He is not had any recent imaging studies of the back done.  Update 8/2//2017 CM: Eugene Murray, 81 year old male returns for follow-up after last visit 6 months ago. He continues to do well without recurrent stroke or TIA symptoms. He feels his headaches are about the same  they're less frequent and he rarely needs to take ibuprofen which works quite well. He is tolerating Topamax and denies any short-term memory difficulties. He is currently taking 50 mg daily. He does not wish to taper the medication. His blood pressure in the office today is 138/82. He had recent changes to his HCTZ  due to complaints of dizziness. Patient is quite active and walks a mile a day and goes to the gym for 1-2 hours 3 days a week. He has no other complaints today UPDATE 02/02/2018CM Eugene Murray, 81 year old male returns for follow-up. He has a history of stroke event 2014. He is currently on Plavix and aspirin with no bruising and no bleeding. He is also on Lipitor without complaints of myalgias. He has a history of obstructive sleep apnea recently got a new machine, he claims he is compliant He also has a history of headaches which are rare and less frequent. He is currently on Topamax. Recent labs done his primary care this week he was told were all normal. He remains active goes to the gym at least 3 days a week. He has not had her stroke TIA events since 2014 he returns for reevaluation ROS:   14 system review of systems is positive for dizziness, imbalance all other systems negative  PMH:  Past Medical History:  Diagnosis Date   Allergic rhinitis    Cancer ([place])    Prostate cancer   Coronary artery disease    History of prostate cancer    Hyperlipidemia    Hypertension    Hypokalemia    Nephritis    as a child   OSA (obstructive sleep apnea)    cpap machine at home   Stroke [facility])    "mini stroke" TIA    Social  History:  Social History   Socioeconomic History   Marital status: Married    Spouse name: Eugene Murray   Number of children: 3   Years of education: College   Highest education level: Not on file  Occupational History   Occupation: retired  Tobacco Use   Smoking status: Former    Years: 15.00    Types: Cigarettes    Quit date: 09/08/1985    Years since quitting: 36.2   Smokeless tobacco: Never  Vaping Use   Vaping Use: Never used  Substance and Sexual Activity   Alcohol use: Not Currently    Comment: seldom   Drug use: No   Sexual activity: Not Currently  Other Topics Concern   Not on file  Social History Narrative   3 kids    Retired Arboriculturist    Former smoker quit 1980s.  Denies etOH, other drugs    Patient lives at home with family. Patient lives at home with his wife Eugene Murray)   Caffeine Use: 1 glass of tea every other day   Social Determinants of Health   Financial Resource Strain: Low Risk  (10/14/2021)   Overall Financial Resource Strain (CARDIA)    Difficulty of Paying Living Expenses: Not hard at all  Food Insecurity: No Food Insecurity (10/14/2021)   Hunger Vital Sign    Worried About Running Out of Food in the Last Year: Never true    Ran Out of Food in the Last Year: Never true  Transportation Needs: No Transportation Needs (10/14/2021)   PRAPARE - Hydrologist (Medical): No    Lack of Transportation (Non-Medical): No  Physical Activity: Inactive (10/14/2021)   Exercise Vital Sign    Days of Exercise per Week: 0 days    Minutes of Exercise per Session: 0 min  Stress: Stress Concern Present (10/14/2021)   [place]    Feeling of Stress : To some extent  Social Connections: Not on file  Intimate Partner Violence: Not At Risk (08/29/2018)   Humiliation, Afraid, Rape, and Kick questionnaire    Fear of Current or Ex-Partner: No    Emotionally Abused: No    Physically  Abused: No    Sexually Abused: No    Medications:   Current Outpatient Medications on File Prior to Visit  Medication Sig Dispense Refill   amLODipine (NORVASC) 10 MG tablet Take 1 tablet (10 mg total) by mouth daily. 90 tablet 3   atorvastatin (LIPITOR) 40 MG tablet TAKE 1 TABLET(40 MG) BY MOUTH DAILY 30 tablet 10   Cholecalciferol (VITAMIN D) 2000 units CAPS Take 2,000 Units by mouth daily.     clopidogrel (PLAVIX) 75 MG tablet Take 1 tablet (75 mg total) by mouth daily. SCHEDULE OFFICE VISIT FOR FUTURE REFILLS. 90 tablet 0   EPINEPHrine (EPIPEN 2-PAK) 0.3 mg/0.3 mL IJ SOAJ injection Inject 0.3 mg into the muscle as needed for anaphylaxis. 2 each 1   hydrochlorothiazide (MICROZIDE) 12.5 MG capsule TAKE 1 CAPSULE(12.5 MG) BY MOUTH DAILY 90 capsule 2   isosorbide mononitrate (IMDUR) 30 MG 24 hr tablet TAKE 1 TABLET(30 MG) BY MOUTH DAILY 90 tablet 3   lisinopril (ZESTRIL) 40 MG tablet Take 1 tablet (40 mg total) by mouth daily. 90 tablet 3   magnesium oxide (MAG-OX) 400 MG tablet Take 400 mg by mouth daily.     metoprolol tartrate (LOPRESSOR) 50 MG tablet TAKE 1 TABLET(50 MG) BY MOUTH TWICE DAILY 180 tablet 2   nitroGLYCERIN (NITROSTAT) 0.4 MG SL tablet Place 1 tablet (0.4 mg total) under the tongue every 5 (five) minutes as needed for chest pain. 25 tablet 2   No current facility-administered medications on file prior to visit.    Allergies:   Allergies  Allergen Reactions   Other Hives    SEAFOOD   Penicillins Other (See Comments)    "caused me to pass out"   Sulfa Antibiotics Hives    Physical Exam General: well developed, well nourished pleasant elderly African-American male, seated, in no evident distress Head: head normocephalic and atraumatic.   Neck: supple with no carotid or supraclavicular bruits Cardiovascular: regular rate and rhythm, no murmurs Musculoskeletal: no deformity Skin:  no rash/petichiae Vascular:  Normal pulses all extremities  Neurologic Exam Mental  Status: Awake and fully alert. Oriented to place and time. Recent and  remote memory intact. Attention span, concentration and fund of knowledge appropriate. Mood and affect appropriate.  Cranial Nerves: Fundoscopic exam reveals sharp disc margins. Pupils equal, briskly reactive to light. Extraocular movements full without nystagmus. Visual fields full to confrontation. Hearing intact. Facial sensation intact. Face, tongue, palate moves normally and symmetrically.  Motor: Normal bulk and tone. Normal strength in all tested extremity muscles. Sensory.: intact to touch , pinprick , position and vibratory sensation.  Coordination: Rapid alternating movements normal in all extremities. Finger-to-nose and heel-to-shin performed accurately bilaterally. Gait and Station: Arises from chair without difficulty. Stance is normal. Gait demonstrates normal stride length and balance . Able to heel, toe and tandem walk with mild difficulty.  Reflexes: 1+ and symmetric. Toes downgoing.   NIHSS  0 Modified Rankin  1   ASSESSMENT: 81 year old male with intermittent transient dizziness and imbalance of unclear etiology.  Differential includes vertebrobasilar versus medication effect of antihypertensive.  Past history of brainstem infarct in May 2014 with vascular risk factors of hypertension, hyperlipidemia, coronary artery disease     PLAN: I had a long discussion with the patient regarding his symptoms of intermittent dizziness of unclear etiology.  Recommend close monitoring of his blood pressure if it continues to be on the lower side follow-up with primary care physician.  Will consider lowering blood pressure medications.  Check MR angiogram of the brain and neck to rule out vertebral basilar occlusive disease.  Continue Plavix for stroke prevention with aggressive risk factor modification with strict control of hypertension with blood pressure goal below 140/90, lipids with LDL cholesterol goal below percent and  diabetes with hemoglobin A1c goal below 6.5%.  I also encouraged him to maintain adequate hydration and drink 8 to 10 glasses of liquids/fluids today return for follow-up in the future in 3 months with my nurse practitioner or call earlier if necessary. Greater than 50% time during this 45-minute consultation visit was spent on counseling and coordination of care about his dizziness and discussion about differential diagnosis and evaluation and treatment plan discussion answering questions. Antony Contras, MD Note: This document was prepared with digital dictation and possible smart phrase technology. Any transcriptional errors that result from this process are unintentional.

## 2021-12-15 ENCOUNTER — Telehealth: Payer: Self-pay | Admitting: Neurology

## 2021-12-15 LAB — LIPID PANEL
Chol/HDL Ratio: 2.4 ratio (ref 0.0–5.0)
Cholesterol, Total: 100 mg/dL (ref 100–199)
HDL: 41 mg/dL (ref >39)
LDL Chol Calc (NIH): 47 mg/dL (ref 0–99)
Triglycerides: 46 mg/dL (ref 0–149)
VLDL Cholesterol Cal: 12 mg/dL (ref 5–40)

## 2021-12-15 LAB — HEMOGLOBIN A1C
Est. average glucose Bld gHb Est-mCnc: 117 mg/dL
Hgb A1c MFr Bld: 5.7 % — ABNORMAL HIGH (ref 4.8–5.6)

## 2021-12-15 NOTE — Telephone Encounter (Signed)
medicare/[place] NPR sent to [facility] [phone] 

## 2021-12-29 ENCOUNTER — Other Ambulatory Visit: Payer: Medicare Other

## 2022-01-07 ENCOUNTER — Ambulatory Visit: Payer: Self-pay

## 2022-01-07 NOTE — Patient Outreach (Signed)
  Care Coordination   01/07/2022 Name: Eugene Murray MRN: [id number] DOB: 1940/05/24   Care Coordination Outreach Attempts:  An unsuccessful telephone outreach was attempted for a scheduled appointment today.  Follow Up Plan:  Additional outreach attempts will be made to offer the patient care coordination information and services.   Encounter Outcome:  No Answer  Care Coordination Interventions Activated:  No   Care Coordination Interventions:  No, not indicated    Barb Merino, RN, BSN, CCM Care Management Coordinator [facility]  Direct Phone: [phone]

## 2022-01-07 NOTE — Patient Outreach (Signed)
  Care Coordination   Follow Up Visit Note   01/07/2022 Name: Eugene Murray MRN: [id number] DOB: November 03, 1940  Eugene Murray is a 81 y.o. year old male who sees [name], MD for primary care. I spoke with  Eugene Murray by phone today.  What matters to the patients health and wellness today?  Patient is scheduled for a head/neck MRA for evaluation of dizziness with impaired balance.     Goals Addressed               This Visit's Progress     Patient Stated     I sometimes feel a Eugene Murray off balance (pt-stated)        Care Coordination Interventions: Evaluation of current treatment plan related to hypertension self management and patient's adherence to plan as established by provider Determined patient completed his new patient appointment with Dr. Leonie Man, Neurologist for evaluation of his c/o dizziness with impaired balance Review of patient status, including review of consultant's reports, relevant laboratory and other test results Reviewed upcoming scheduled MRA head/neck scheduled for 01/13/22 '@2'$ :56 PM/3 PM, confirmed patient has transportation for these appointments         SDOH assessments and interventions completed:  No     Care Coordination Interventions Activated:  Yes  Care Coordination Interventions:  Yes, provided   Follow up plan: Follow up call scheduled for 02/05/22 '@1100'$  AM    Encounter Outcome:  Pt. Visit Completed

## 2022-01-07 NOTE — Patient Instructions (Signed)
Visit Information  Thank you for taking time to visit with me today. Please don't hesitate to contact me if I can be of assistance to you.   Following are the goals we discussed today:   Goals Addressed               This Visit's Progress     Patient Stated     I sometimes feel a Hema Lanza off balance (pt-stated)        Care Coordination Interventions: Evaluation of current treatment plan related to hypertension self management and patient's adherence to plan as established by provider Determined patient completed his new patient appointment with Dr. Leonie Man, Neurologist for evaluation of his c/o dizziness with impaired balance Review of patient status, including review of consultant's reports, relevant laboratory and other test results Reviewed upcoming scheduled MRA head/neck scheduled for 01/13/22 '@2'$ :40 PM/3 PM, confirmed patient has transportation for these appointments         Our next appointment is by telephone on 02/05/22 at 1100 AM  Please call the care guide team at [phone] if you need to cancel or reschedule your appointment.   If you are experiencing a Mental Health or [place] or need someone to talk to, please call 1-800-273-TALK (toll free, 24 hour hotline)  Patient verbalizes understanding of instructions and care plan provided today and agrees to view in [place]. Active MyChart status and patient understanding of how to access instructions and care plan via MyChart confirmed with patient.     Barb Merino, RN, BSN, CCM Care Management Coordinator [facility] Care Management Direct Phone: [phone]

## 2022-01-13 ENCOUNTER — Ambulatory Visit
Admission: RE | Admit: 2022-01-13 | Discharge: 2022-01-13 | Disposition: A | Discharge status: Home / Self Care | Payer: Medicare Other | Source: Ambulatory Visit | Attending: Neurology | Admitting: Neurology

## 2022-01-13 DIAGNOSIS — H814 Vertigo of central origin: Secondary | ICD-10-CM | POA: Diagnosis not present

## 2022-01-13 MED ORDER — GADOPICLENOL 0.5 MMOL/ML IV SOLN
7.5000 mL | Freq: Once | INTRAVENOUS | Status: AC | PRN
  Administered 2022-01-13: 7.5 mL via INTRAVENOUS

## 2022-01-14 DIAGNOSIS — H814 Vertigo of central origin: Secondary | ICD-10-CM | POA: Diagnosis not present

## 2022-01-14 NOTE — Progress Notes (Signed)
Kindly inform the patient that MR angiogram study of the neck and the brain blood vessels was both satisfactory without any major blockages noted

## 2022-01-16 ENCOUNTER — Other Ambulatory Visit: Payer: Self-pay | Admitting: Internal Medicine

## 2022-01-18 ENCOUNTER — Telehealth: Payer: Self-pay

## 2022-01-18 NOTE — Telephone Encounter (Signed)
I called patient. I advised him of his MRA of head and neck results. Pt verbalized understanding. Pt had no questions at this time but was encouraged to call back if questions arise.

## 2022-01-18 NOTE — Telephone Encounter (Signed)
-----   Message from Garvin Fila, MD sent at 01/14/2022  5:06 PM EST ----- Kindly inform the patient that MR angiogram study of the neck and the brain blood vessels was both satisfactory without any major blockages noted

## 2022-01-19 ENCOUNTER — Encounter: Payer: Medicare Other | Admitting: Internal Medicine

## 2022-01-19 VITALS — BP 132/77 | HR 56 | Temp 98.1°F | Ht 70.0 in | Wt 195.0 lb

## 2022-01-19 DIAGNOSIS — Z23 Encounter for immunization: Secondary | ICD-10-CM

## 2022-01-19 NOTE — Progress Notes (Signed)
Patient presents today for second shingrix.

## 2022-01-19 NOTE — Patient Instructions (Signed)

## 2022-01-25 ENCOUNTER — Ambulatory Visit: Payer: Medicare Other | Admitting: Neurology

## 2022-02-05 ENCOUNTER — Ambulatory Visit: Payer: Self-pay

## 2022-02-05 NOTE — Patient Outreach (Signed)
  Care Coordination   02/05/2022 Name: STOY FENN MRN: [id number] DOB: 01-14-1941   Care Coordination Outreach Attempts:  An unsuccessful telephone outreach was attempted today to offer the patient information about available care coordination services as a benefit of their health plan.   Follow Up Plan:  Additional outreach attempts will be made to offer the patient care coordination information and services.   Encounter Outcome:  No Answer   Care Coordination Interventions:  No, not indicated    Barb Merino, RN, BSN, CCM Care Management Coordinator [facility] Care Management  Direct Phone: [phone]

## 2022-03-22 ENCOUNTER — Encounter: Payer: Self-pay | Admitting: Adult Health

## 2022-03-22 ENCOUNTER — Ambulatory Visit (INDEPENDENT_AMBULATORY_CARE_PROVIDER_SITE_OTHER): Payer: Medicare Other | Admitting: Adult Health

## 2022-03-22 VITALS — BP 140/74 | HR 59 | Ht 70.0 in | Wt 190.2 lb

## 2022-03-22 DIAGNOSIS — Z8673 Personal history of transient ischemic attack (TIA), and cerebral infarction without residual deficits: Secondary | ICD-10-CM

## 2022-03-22 DIAGNOSIS — R42 Dizziness and giddiness: Secondary | ICD-10-CM

## 2022-03-22 NOTE — Patient Instructions (Signed)
No changes at this time  If dizziness should reoccur, would recommend you be seen by cardiology to rule out other causes of dizziness as your recent imaging of your head and neck vessels did not show any concerning findings  Continue to increase your water intake with goal of at least 64-100 oz of water per day  Continue plavix and atorvastatin for secondary stroke prevention measures  Continue to follow with your PCP Dr. Baird Cancer for stroke risk factor management

## 2022-03-22 NOTE — Progress Notes (Signed)
[facility] [address] [place]. [place] [address] [phone]       OFFICE FOLLOW UP NOTE  [name] Date of Birth:  09-18-1940 Medical Record Number:  [id number]   Primary neurologist: Dr. Leonie Man Referring MD: [name]  Reason for visit: Dizziness  HPI:   Update 03/22/2022 JM: Patient returns for routine follow-up visit regarding dizziness.  He reports improvement of dizziness since prior visit.  Has been working on increased water intake, tries to drink at least 2 bottles of water but at times will forget. Has not been routinely monitoring BP at home but denies any low readings that he is aware of since prior visit. Completed MRA head/neck which was unremarkable.  Is currently recovering from the flu. Routinely follows with PCP.  He has not had recent follow-up with cardiology.      History provided for reference purposes only Consult visit 12/14/2021 Dr. Leonie Man: Mr. [name] is a 82 year old pleasant initial office consultation visit for.  History is obtained from the patient review of electronic medical records and I personally reviewed pertinent available imaging films and PACS.  He has past medical history of hypertension, hyperlipidemia, obstructive sleep apnea, coronary artery disease and a brainstem infarct in 2014.  Patient states for the last few months he has noticed remittent dizziness.  He describes this as a feeling of imbalance and leaning to 1 side for quite transient and if he holds onto something and waits for the feeling usually passes away fairly quickly.  He denies true vertigo, diplopia, dysarthria, extremity weakness he is unable to identify specific triggers for these episodes.  He states when he gets up in the morning a little more unsteady on his feet.  He saw his primary care physician who advised him to do some orthostatic tolerance exercises which he has been doing.  He states his blood pressure at home runs in the 425Z systolic  range known to be consistent.  Denies any tingling numbness or burning in his feet.  Frequent falls or injuries he has a prior history of brainstem infarction May 2014.  He was seen by me in the office on several occasions last visit being on 04/02/2016 with Evlyn Courier, NP.  Patient recently had MRI scan of the brain on 11/26/2021 which showed progressive changes of chronic small vessel disease without any acute abnormalities.    ROS:   14 system review of systems is positive for dizziness, imbalance all other systems negative  PMH:  Past Medical History:  Diagnosis Date   Allergic rhinitis    Cancer ([place])    Prostate cancer   Coronary artery disease    History of prostate cancer    Hyperlipidemia    Hypertension    Hypokalemia    Nephritis    as a child   OSA (obstructive sleep apnea)    cpap machine at home   Stroke [facility])    "mini stroke" TIA    Social History:  Social History   Socioeconomic History   Marital status: Married    Spouse name: Eugene Murray   Number of children: 3   Years of education: College   Highest education level: Not on file  Occupational History   Occupation: retired  Tobacco Use   Smoking status: Former    Years: 15.00    Types: Cigarettes    Quit date: 09/08/1985    Years since quitting: 36.5   Smokeless tobacco: Never  Vaping Use   Vaping Use: Never  used  Substance and Sexual Activity   Alcohol use: Not Currently    Comment: seldom   Drug use: No   Sexual activity: Not Currently  Other Topics Concern   Not on file  Social History Narrative   3 kids    Retired Arboriculturist    Former smoker quit 1980s. Denies etOH, other drugs    Patient lives at home with family. Patient lives at home with his wife Eugene Murray)   Caffeine Use: 1 glass of tea every other day   Social Determinants of Health   Financial Resource Strain: Low Risk  (10/14/2021)   Overall Financial Resource Strain (CARDIA)    Difficulty of Paying Living Expenses: Not hard at all   Food Insecurity: No Food Insecurity (10/14/2021)   Hunger Vital Sign    Worried About Running Out of Food in the Last Year: Never true    Ran Out of Food in the Last Year: Never true  Transportation Needs: No Transportation Needs (10/14/2021)   PRAPARE - Hydrologist (Medical): No    Lack of Transportation (Non-Medical): No  Physical Activity: Inactive (10/14/2021)   Exercise Vital Sign    Days of Exercise per Week: 0 days    Minutes of Exercise per Session: 0 min  Stress: Stress Concern Present (10/14/2021)   [place]    Feeling of Stress : To some extent  Social Connections: Not on file  Intimate Partner Violence: Not At Risk (08/29/2018)   Humiliation, Afraid, Rape, and Kick questionnaire    Fear of Current or Ex-Partner: No    Emotionally Abused: No    Physically Abused: No    Sexually Abused: No    Medications:   Current Outpatient Medications on File Prior to Visit  Medication Sig Dispense Refill   amLODipine (NORVASC) 10 MG tablet Take 1 tablet (10 mg total) by mouth daily. 90 tablet 3   atorvastatin (LIPITOR) 40 MG tablet TAKE 1 TABLET(40 MG) BY MOUTH DAILY 90 tablet 0   Cholecalciferol (VITAMIN D) 2000 units CAPS Take 2,000 Units by mouth daily.     clopidogrel (PLAVIX) 75 MG tablet Take 1 tablet (75 mg total) by mouth daily. SCHEDULE OFFICE VISIT FOR FUTURE REFILLS. 90 tablet 0   EPINEPHrine (EPIPEN 2-PAK) 0.3 mg/0.3 mL IJ SOAJ injection Inject 0.3 mg into the muscle as needed for anaphylaxis. 2 each 1   hydrochlorothiazide (MICROZIDE) 12.5 MG capsule TAKE 1 CAPSULE(12.5 MG) BY MOUTH DAILY 90 capsule 2   isosorbide mononitrate (IMDUR) 30 MG 24 hr tablet TAKE 1 TABLET(30 MG) BY MOUTH DAILY 90 tablet 3   lisinopril (ZESTRIL) 40 MG tablet Take 1 tablet (40 mg total) by mouth daily. 90 tablet 3   magnesium oxide (MAG-OX) 400 MG tablet Take 400 mg by mouth daily.     metoprolol  tartrate (LOPRESSOR) 50 MG tablet TAKE 1 TABLET(50 MG) BY MOUTH TWICE DAILY 180 tablet 2   nitroGLYCERIN (NITROSTAT) 0.4 MG SL tablet Place 1 tablet (0.4 mg total) under the tongue every 5 (five) minutes as needed for chest pain. 25 tablet 2   No current facility-administered medications on file prior to visit.    Allergies:   Allergies  Allergen Reactions   Other Hives    SEAFOOD   Penicillins Other (See Comments)    "caused me to pass out"   Sulfa Antibiotics Hives    Physical Exam Today's Vitals   03/22/22 1511  BP: Marland Kitchen)  140/74  Pulse: (!) 59  Weight: 190 lb 3.2 oz (86.3 kg)  Height: '5\' 10"'$  (1.778 m)   Body mass index is 27.29 kg/m.  General: well developed, well nourished very pleasant elderly African-American male, seated, in no evident distress Head: head normocephalic and atraumatic.   Neck: supple with no carotid or supraclavicular bruits Cardiovascular: regular rate and rhythm, no murmurs Musculoskeletal: no deformity Skin:  no rash/petichiae Vascular:  Normal pulses all extremities  Neurologic Exam Mental Status: Awake and fully alert. Oriented to place and time. Recent and remote memory intact. Attention span, concentration and fund of knowledge appropriate. Mood and affect appropriate.  Cranial Nerves: Pupils equal, briskly reactive to light. Extraocular movements full without nystagmus. Visual fields full to confrontation. Hearing intact. Facial sensation intact. Face, tongue, palate moves normally and symmetrically.  Motor: Normal bulk and tone. Normal strength in all tested extremity muscles. Sensory.: intact to touch , pinprick , position and vibratory sensation.  Coordination: Rapid alternating movements normal in all extremities. Finger-to-nose and heel-to-shin performed accurately bilaterally. Gait and Station: Arises from chair without difficulty. Stance is normal. Gait demonstrates normal stride length and balance . Able to heel, toe and tandem walk with  mild difficulty.  Reflexes: 1+ and symmetric. Toes downgoing.        ASSESSMENT/PLAN: 82 year old male with intermittent transient dizziness and imbalance of unclear etiology.  MRA head/neck unremarkable. Possibly related to blood pressure and antihypertensive medication side effects.  Has been gradually improving especially since increasing water intake.  Past history of brainstem infarct in May 2014 with vascular risk factors of hypertension, hyperlipidemia, coronary artery disease   -Highly encouraged further increasing water intake to at least 64-100 oz of water per day, discussed drinking majority of intake early in the day to avoid insomnia -if dizziness should worsen, may benefit from evaluation by cardiology but will defer this to PCP -Continue Plavix and atorvastatin 40 mg daily for secondary stroke prevention measures -Continue close PCP follow-up for aggressive stroke risk factor management including BP goal<130/90 and HLD with LDL goal<70    Follow up as needed at this time   I spent 24 minutes of face-to-face and non-face-to-face time with patient.  This included previsit chart review, lab review, study review, order entry, electronic health record documentation, patient education and discussion regarding the above and answered all the questions to patient's satisfaction  Frann Rider, [facility]  [facility] [address] [place], [place] [address]  Phone [phone] Fax [phone] Note: This document was prepared with digital dictation and possible smart phrase technology. Any transcriptional errors that result from this process are unintentional.

## 2022-04-02 ENCOUNTER — Ambulatory Visit: Payer: Self-pay

## 2022-04-02 NOTE — Patient Instructions (Signed)
Visit Information  Thank you for taking time to visit with me today. Please don't hesitate to contact me if I can be of assistance to you.   Following are the goals we discussed today:   Goals Addressed               This Visit's Progress     Patient Stated     I sometimes feel a Wilver Tignor off balance (pt-stated)        Care Coordination Interventions: Evaluation of current treatment plan related to hypertension self management and patient's adherence to plan as established by provider Determined patient completed his f/u with Neurology for follow up on dizziness Discussed patient feels his symptoms improve when he increases his water intake, although he sometimes forgets to drink water Educated patient on benefits of increasing water, aiming for 48-64 oz per day Determined patient is adherent with CPAP usage, although he is using an older version of his machine when staying away from home  Discussed patient will use his older CPAP only when unable to stay at his home at which time he will use his new CPAP Instructed patient to notify his PCP of new or worsening symptoms       Other     To monitor BP at home on a regular basis        Care Coordination Interventions: Evaluation of current treatment plan related to hypertension self management and patient's adherence to plan as established by provider Advised patient, providing education and rationale, to monitor blood pressure daily and record, calling PCP for findings outside established parameters Mailed printed educational materials related to the DASH diet; BP log; How to monitor BP at home            Our next appointment is by telephone on 07/01/22 at 1:30 PM  Please call the care guide team at [phone] if you need to cancel or reschedule your appointment.   If you are experiencing a Mental Health or [place] or need someone to talk to, please go to [facility] [address] [place] [phone])  Patient verbalizes understanding of instructions and care plan provided today and agrees to view in [place]. Active MyChart status and patient understanding of how to access instructions and care plan via MyChart confirmed with patient.     Barb Merino, RN, BSN, CCM Care Management Coordinator [facility] Care Management Direct Phone: [phone]

## 2022-04-02 NOTE — Patient Outreach (Signed)
  Care Coordination   Follow Up Visit Note   04/02/2022 Name: Eugene Murray MRN: [id number] DOB: 02-Oct-1940  Eugene Murray is a 82 y.o. year old male who sees [name], MD for primary care. I spoke with  [name] by phone today.  What matters to the patients health and wellness today?  Patient will monitor his BP at home a more regular basis and report abnormal readings or concerns promptly.     Goals Addressed               This Visit's Progress     Patient Stated     I sometimes feel a Cecile Gillispie off balance (pt-stated)        Care Coordination Interventions: Evaluation of current treatment plan related to hypertension self management and patient's adherence to plan as established by provider Determined patient completed his f/u with Neurology for follow up on dizziness Discussed patient feels his symptoms improve when he increases his water intake, although he sometimes forgets to drink water Educated patient on benefits of increasing water, aiming for 48-64 oz per day Determined patient is adherent with CPAP usage, although he is using an older version of his machine when staying away from home  Discussed patient will use his older CPAP only when unable to stay at his home at which time he will use his new CPAP Instructed patient to notify his PCP of new or worsening symptoms       Other     To monitor BP at home on a regular basis        Care Coordination Interventions: Evaluation of current treatment plan related to hypertension self management and patient's adherence to plan as established by provider Advised patient, providing education and rationale, to monitor blood pressure daily and record, calling PCP for findings outside established parameters Mailed printed educational materials related to the DASH diet; BP log; How to monitor BP at home            SDOH assessments and interventions completed:  No     Care Coordination Interventions:  Yes,  provided   Follow up plan: Follow up call scheduled for 07/01/22 '@1'$ :30 PM     Encounter Outcome:  Pt. Visit Completed

## 2022-04-09 ENCOUNTER — Ambulatory Visit (INDEPENDENT_AMBULATORY_CARE_PROVIDER_SITE_OTHER): Payer: Medicare Other | Admitting: Internal Medicine

## 2022-04-09 ENCOUNTER — Encounter: Payer: Self-pay | Admitting: Internal Medicine

## 2022-04-09 VITALS — BP 126/78 | HR 58 | Temp 97.7°F | Ht 70.0 in | Wt 191.2 lb

## 2022-04-09 DIAGNOSIS — R5383 Other fatigue: Secondary | ICD-10-CM | POA: Diagnosis not present

## 2022-04-09 DIAGNOSIS — Z2821 Immunization not carried out because of patient refusal: Secondary | ICD-10-CM

## 2022-04-09 DIAGNOSIS — Z9989 Dependence on other enabling machines and devices: Secondary | ICD-10-CM | POA: Diagnosis not present

## 2022-04-09 DIAGNOSIS — I25118 Atherosclerotic heart disease of native coronary artery with other forms of angina pectoris: Secondary | ICD-10-CM

## 2022-04-09 DIAGNOSIS — E7800 Pure hypercholesterolemia, unspecified: Secondary | ICD-10-CM | POA: Diagnosis not present

## 2022-04-09 DIAGNOSIS — R7309 Other abnormal glucose: Secondary | ICD-10-CM

## 2022-04-09 DIAGNOSIS — Z79899 Other long term (current) drug therapy: Secondary | ICD-10-CM | POA: Diagnosis not present

## 2022-04-09 DIAGNOSIS — I119 Hypertensive heart disease without heart failure: Secondary | ICD-10-CM | POA: Diagnosis not present

## 2022-04-09 DIAGNOSIS — G4733 Obstructive sleep apnea (adult) (pediatric): Secondary | ICD-10-CM | POA: Diagnosis not present

## 2022-04-09 DIAGNOSIS — Z6827 Body mass index (BMI) 27.0-27.9, adult: Secondary | ICD-10-CM | POA: Diagnosis not present

## 2022-04-09 NOTE — Progress Notes (Unsigned)
I,[name],acting as a scribe for Maximino Greenland, MD.,have documented all relevant documentation on the behalf of Maximino Greenland, MD,as directed by  Maximino Greenland, MD while in the presence of Maximino Greenland, MD.    Subjective:     Patient ID: Eugene Murray , male    DOB: Sep 02, 1940 , 82 y.o.   MRN: [id number]   Chief Complaint  Patient presents with   Hypertension   Hyperlipidemia    HPI  He presents today for BP and cholesterol f/u. He reports compliance with meds. He states today he has a dull headache. Not bad enough to take any medication. He does report feeling fatigued and low energy. He does have h/o OSA, admits he has not had his CPAP checked in a long time.   Patient has brought iat home cuff. Reading: 142/84      Hypertension This is a chronic problem. The current episode started more than 1 year ago. The problem has been gradually improving since onset. The problem is controlled. Associated symptoms include headaches. Risk factors for coronary artery disease include dyslipidemia and male gender. The current treatment provides moderate improvement. Hypertensive end-organ damage includes CAD/MI.  Hyperlipidemia This is a chronic problem. The current episode started more than 1 year ago. He has no history of diabetes. Current antihyperlipidemic treatment includes statins.     Past Medical History:  Diagnosis Date   Allergic rhinitis    Cancer ([place])    Prostate cancer   Coronary artery disease    History of prostate cancer    Hyperlipidemia    Hypertension    Hypokalemia    Nephritis    as a child   OSA (obstructive sleep apnea)    cpap machine at home   Stroke [facility])    "mini stroke" TIA     Family History  Problem Relation Age of Onset   Colon cancer Father        possibly age 15 y.o diagnosed    Stroke Mother    Asthma Sister      Current Outpatient Medications:    amLODipine (NORVASC) 10 MG tablet, Take 1 tablet (10 mg total) by  mouth daily., Disp: 90 tablet, Rfl: 3   atorvastatin (LIPITOR) 40 MG tablet, TAKE 1 TABLET(40 MG) BY MOUTH DAILY, Disp: 90 tablet, Rfl: 0   Cholecalciferol (VITAMIN D) 2000 units CAPS, Take 2,000 Units by mouth daily., Disp: , Rfl:    clopidogrel (PLAVIX) 75 MG tablet, Take 1 tablet (75 mg total) by mouth daily. SCHEDULE OFFICE VISIT FOR FUTURE REFILLS., Disp: 90 tablet, Rfl: 0   EPINEPHrine (EPIPEN 2-PAK) 0.3 mg/0.3 mL IJ SOAJ injection, Inject 0.3 mg into the muscle as needed for anaphylaxis., Disp: 2 each, Rfl: 1   hydrochlorothiazide (MICROZIDE) 12.5 MG capsule, TAKE 1 CAPSULE(12.5 MG) BY MOUTH DAILY, Disp: 90 capsule, Rfl: 2   isosorbide mononitrate (IMDUR) 30 MG 24 hr tablet, TAKE 1 TABLET(30 MG) BY MOUTH DAILY, Disp: 90 tablet, Rfl: 3   lisinopril (ZESTRIL) 40 MG tablet, Take 1 tablet (40 mg total) by mouth daily., Disp: 90 tablet, Rfl: 3   magnesium oxide (MAG-OX) 400 MG tablet, Take 400 mg by mouth daily., Disp: , Rfl:    metoprolol tartrate (LOPRESSOR) 50 MG tablet, TAKE 1 TABLET(50 MG) BY MOUTH TWICE DAILY, Disp: 180 tablet, Rfl: 2   nitroGLYCERIN (NITROSTAT) 0.4 MG SL tablet, Place 1 tablet (0.4 mg total) under the tongue every 5 (five) minutes as needed for chest  pain., Disp: 25 tablet, Rfl: 2   Allergies  Allergen Reactions   Other Hives    SEAFOOD   Penicillins Other (See Comments)    "caused me to pass out"   Sulfa Antibiotics Hives     Review of Systems  Constitutional:  Positive for fatigue.  HENT: Negative.    Respiratory: Negative.    Cardiovascular: Negative.   Gastrointestinal: Negative.   Genitourinary: Negative.   Skin: Negative.   Neurological:  Positive for headaches.  Hematological: Negative.      Today's Vitals   04/09/22 1335  BP: 126/78  Pulse: (!) 58  Temp: 97.7 F (36.5 C)  SpO2: 96%  Weight: 191 lb 3.2 oz (86.7 kg)  Height: 5' 10"$  (1.778 m)   Body mass index is 27.43 kg/m.  Wt Readings from Last 3 Encounters:  04/09/22 191 lb 3.2 oz (86.7  kg)  03/22/22 190 lb 3.2 oz (86.3 kg)  01/19/22 195 lb (88.5 kg)    BP Readings from Last 3 Encounters:  04/09/22 126/78  03/22/22 (!) 140/74  01/19/22 132/77     Objective:  Physical Exam Vitals and nursing note reviewed.  Constitutional:      Appearance: Normal appearance.  HENT:     Head: Normocephalic and atraumatic.     Nose:     Comments: MASKED     Mouth/Throat:     Comments: MASKED  Eyes:     Extraocular Movements: Extraocular movements intact.  Cardiovascular:     Rate and Rhythm: Normal rate and regular rhythm.     Heart sounds: Normal heart sounds.  Pulmonary:     Effort: Pulmonary effort is normal.     Breath sounds: Normal breath sounds.  Musculoskeletal:     Cervical back: Normal range of motion.  Skin:    General: Skin is warm.  Neurological:     General: No focal deficit present.     Mental Status: He is alert.  Psychiatric:        Mood and Affect: Mood normal.      Assessment And Plan:     1. Hypertensive heart disease without heart failure Comments: Chronic, controlled. He will c/w lisinopril 73m, metoprolol 531mpo BID , HCTZ 12.67m78mnd amlodipine 57m16mily. Reminded to follow low sodium diet. - CMP14+EGFR - CBC  2. Atherosclerosis of native coronary artery of native heart with other form of angina pectoris (HCC)[place]: Chronic, LDL goal <70. He will c/w B-blocker, Ace inhibitor, Plavix and NTG prn. He is encouraged to follow heart healthy lifestyle.  3. Pure hypercholesterolemia Comments: Chronic, LDL goal <70. He will c/w atorvastatin 40mg69mly. - CMP14+EGFR - CBC  4. Fatigue, unspecified type Comments: Possibly multifactorial. His sx may be related to OSA and current settings on CPAP. He is also encouraged to stay well hydrated.  5. OSA on CPAP Comments: Chronic, he reports compliance. I will refer him to Dr. KelleGeorgina Peerf/u as requested. - Ambulatory referral to Cardiology  6. Abnormal glucose Comments: HIs a1c has been  elevated in the past. I will recheck this today. He is encouraged to limit his intake of sugary beverages, including diet drinks. - Hemoglobin A1c  7. Body mass index (BMI) of 27.0 to 27.9 in adult Comments: He is encouraged to aim for at least 150 minutes of exercise/week.  8. Pharmacologic therapy - Vitamin B12  9. Influenza vaccination declined   Patient was given opportunity to ask questions. Patient verbalized understanding of the plan and was able  to repeat key elements of the plan. All questions were answered to their satisfaction.   I, Maximino Greenland, MD, have reviewed all documentation for this visit. The documentation on 04/09/22 for the exam, diagnosis, procedures, and orders are all accurate and complete.   IF YOU HAVE BEEN REFERRED TO A SPECIALIST, IT MAY TAKE 1-2 WEEKS TO SCHEDULE/PROCESS THE REFERRAL. IF YOU HAVE NOT HEARD FROM US/SPECIALIST IN TWO WEEKS, PLEASE GIVE [place] A CALL AT [phone].   THE PATIENT IS ENCOURAGED TO PRACTICE SOCIAL DISTANCING DUE TO THE COVID-19 PANDEMIC.

## 2022-04-09 NOTE — Patient Instructions (Signed)
Hypertension, Adult ?Hypertension is another name for high blood pressure. High blood pressure forces your heart to work harder to pump blood. This can cause problems over time. ?There are two numbers in a blood pressure reading. There is a top number (systolic) over a bottom number (diastolic). It is best to have a blood pressure that is below 120/80. ?What are the causes? ?The cause of this condition is not known. Some other conditions can lead to high blood pressure. ?What increases the risk? ?Some lifestyle factors can make you more likely to develop high blood pressure: ?Smoking. ?Not getting enough exercise or physical activity. ?Being overweight. ?Having too much fat, sugar, calories, or salt (sodium) in your diet. ?Drinking too much alcohol. ?Other risk factors include: ?Having any of these conditions: ?Heart disease. ?Diabetes. ?High cholesterol. ?Kidney disease. ?Obstructive sleep apnea. ?Having a family history of high blood pressure and high cholesterol. ?Age. The risk increases with age. ?Stress. ?What are the signs or symptoms? ?High blood pressure may not cause symptoms. Very high blood pressure (hypertensive crisis) may cause: ?Headache. ?Fast or uneven heartbeats (palpitations). ?Shortness of breath. ?Nosebleed. ?Vomiting or feeling like you may vomit (nauseous). ?Changes in how you see. ?Very bad chest pain. ?Feeling dizzy. ?Seizures. ?How is this treated? ?This condition is treated by making healthy lifestyle changes, such as: ?Eating healthy foods. ?Exercising more. ?Drinking less alcohol. ?Your doctor may prescribe medicine if lifestyle changes do not help enough and if: ?Your top number is above 130. ?Your bottom number is above 80. ?Your personal target blood pressure may vary. ?Follow these instructions at home: ?Eating and drinking ? ?If told, follow the DASH eating plan. To follow this plan: ?Fill one half of your plate at each meal with fruits and vegetables. ?Fill one fourth of your plate  at each meal with whole grains. Whole grains include whole-wheat pasta, brown rice, and whole-grain bread. ?Eat or drink low-fat dairy products, such as skim milk or low-fat yogurt. ?Fill one fourth of your plate at each meal with low-fat (lean) proteins. Low-fat proteins include fish, chicken without skin, eggs, beans, and tofu. ?Avoid fatty meat, cured and processed meat, or chicken with skin. ?Avoid pre-made or processed food. ?Limit the amount of salt in your diet to less than 1,500 mg each day. ?Do not drink alcohol if: ?Your doctor tells you not to drink. ?You are pregnant, may be pregnant, or are planning to become pregnant. ?If you drink alcohol: ?Limit how much you have to: ?0-1 drink a day for women. ?0-2 drinks a day for men. ?Know how much alcohol is in your drink. In the [place] one drink equals one 12 oz bottle of beer (355 mL), one 5 oz glass of wine (148 mL), or one 1? oz glass of hard liquor (44 mL). ?Lifestyle ? ?Work with your doctor to stay at a healthy weight or to lose weight. Ask your doctor what the best weight is for you. ?Get at least 30 minutes of exercise that causes your heart to beat faster (aerobic exercise) most days of the week. This may include walking, swimming, or biking. ?Get at least 30 minutes of exercise that strengthens your muscles (resistance exercise) at least 3 days a week. This may include lifting weights or doing Pilates. ?Do not smoke or use any products that contain nicotine or tobacco. If you need help quitting, ask your doctor. ?Check your blood pressure at home as told by your doctor. ?Keep all follow-up visits. ?Medicines ?Take over-the-counter and prescription medicines   only as told by your doctor. Follow directions carefully. ?Do not skip doses of blood pressure medicine. The medicine does not work as well if you skip doses. Skipping doses also puts you at risk for problems. ?Ask your doctor about side effects or reactions to medicines that you should watch  for. ?Contact a doctor if: ?You think you are having a reaction to the medicine you are taking. ?You have headaches that keep coming back. ?You feel dizzy. ?You have swelling in your ankles. ?You have trouble with your vision. ?Get help right away if: ?You get a very bad headache. ?You start to feel mixed up (confused). ?You feel weak or numb. ?You feel faint. ?You have very bad pain in your: ?Chest. ?Belly (abdomen). ?You vomit more than once. ?You have trouble breathing. ?These symptoms may be an emergency. Get help right away. Call 911. ?Do not wait to see if the symptoms will go away. ?Do not drive yourself to the hospital. ?Summary ?Hypertension is another name for high blood pressure. ?High blood pressure forces your heart to work harder to pump blood. ?For most people, a normal blood pressure is less than 120/80. ?Making healthy choices can help lower blood pressure. If your blood pressure does not get lower with healthy choices, you may need to take medicine. ?This information is not intended to replace advice given to you by your health care provider. Make sure you discuss any questions you have with your health care provider. ?Document Revised: 12/04/2020 Document Reviewed: 12/04/2020 ?Elsevier Patient Education ? 2023 [employer] ? ?

## 2022-04-10 LAB — HEMOGLOBIN A1C
Est. average glucose Bld gHb Est-mCnc: 123 mg/dL
Hgb A1c MFr Bld: 5.9 % — ABNORMAL HIGH (ref 4.8–5.6)

## 2022-04-10 LAB — CBC
Hematocrit: 37.8 % (ref 37.5–51.0)
Hemoglobin: 13.4 g/dL (ref 13.0–17.7)
MCH: 32.6 pg (ref 26.6–33.0)
MCHC: 35.4 g/dL (ref 31.5–35.7)
MCV: 92 fL (ref 79–97)
Platelets: 132 10*3/uL — ABNORMAL LOW (ref 150–450)
RBC: 4.11 x10E6/uL — ABNORMAL LOW (ref 4.14–5.80)
RDW: 12.7 % (ref 11.6–15.4)
WBC: 5.3 10*3/uL (ref 3.4–10.8)

## 2022-04-10 LAB — CMP14+EGFR
ALT: 14 IU/L (ref 0–44)
AST: 19 IU/L (ref 0–40)
Albumin/Globulin Ratio: 1.7 (ref 1.2–2.2)
Albumin: 4.1 g/dL (ref 3.7–4.7)
Alkaline Phosphatase: 68 IU/L (ref 44–121)
BUN/Creatinine Ratio: 13 (ref 10–24)
BUN: 13 mg/dL (ref 8–27)
Bilirubin Total: 0.6 mg/dL (ref 0.0–1.2)
CO2: 25 mmol/L (ref 20–29)
Calcium: 8.7 mg/dL (ref 8.6–10.2)
Chloride: 104 mmol/L (ref 96–106)
Creatinine, Ser: 0.97 mg/dL (ref 0.76–1.27)
Globulin, Total: 2.4 g/dL (ref 1.5–4.5)
Glucose: 92 mg/dL (ref 70–99)
Potassium: 4 mmol/L (ref 3.5–5.2)
Sodium: 143 mmol/L (ref 134–144)
Total Protein: 6.5 g/dL (ref 6.0–8.5)
eGFR: 78 mL/min/{1.73_m2} (ref >59)

## 2022-04-10 LAB — VITAMIN B12: Vitamin B-12: 576 pg/mL (ref 232–1245)

## 2022-04-29 ENCOUNTER — Other Ambulatory Visit: Payer: Self-pay | Admitting: Cardiovascular Disease

## 2022-04-29 ENCOUNTER — Other Ambulatory Visit: Payer: Self-pay | Admitting: Internal Medicine

## 2022-04-30 ENCOUNTER — Other Ambulatory Visit: Payer: Self-pay | Admitting: Cardiovascular Disease

## 2022-05-03 ENCOUNTER — Other Ambulatory Visit: Payer: Self-pay | Admitting: Cardiovascular Disease

## 2022-05-04 ENCOUNTER — Other Ambulatory Visit: Payer: Self-pay | Admitting: Cardiovascular Disease

## 2022-05-29 ENCOUNTER — Other Ambulatory Visit: Payer: Self-pay | Admitting: Cardiovascular Disease

## 2022-05-31 ENCOUNTER — Other Ambulatory Visit: Payer: Self-pay | Admitting: Cardiovascular Disease

## 2022-06-17 ENCOUNTER — Encounter: Payer: Self-pay | Admitting: Cardiovascular Disease

## 2022-06-17 ENCOUNTER — Ambulatory Visit: Payer: Medicare Other | Attending: Cardiovascular Disease | Admitting: Cardiovascular Disease

## 2022-06-17 DIAGNOSIS — E785 Hyperlipidemia, unspecified: Secondary | ICD-10-CM

## 2022-06-17 DIAGNOSIS — G4733 Obstructive sleep apnea (adult) (pediatric): Secondary | ICD-10-CM

## 2022-06-17 DIAGNOSIS — Z8673 Personal history of transient ischemic attack (TIA), and cerebral infarction without residual deficits: Secondary | ICD-10-CM

## 2022-06-17 DIAGNOSIS — I10 Essential (primary) hypertension: Secondary | ICD-10-CM

## 2022-06-17 DIAGNOSIS — I2510 Atherosclerotic heart disease of native coronary artery without angina pectoris: Secondary | ICD-10-CM

## 2022-06-17 DIAGNOSIS — Z9861 Coronary angioplasty status: Secondary | ICD-10-CM

## 2022-06-17 NOTE — Progress Notes (Signed)
Patient ID: Eugene Murray, male   DOB: 1940-11-14, 82 y.o.   MRN: [id number]        HPI: Eugene Murray  is a 82 y.o. male presents to the office today for an 26 month follow-up cardiology evaluation.   Eugene Murray  has a history of hypertension, hyperlipidemia, obstructive sleep apnea on CPAP therapy, history of prostate cancer, and CAD.  In November 2008, he underwent initial PCI to his diagonal vessel and RCA. In April 2011 he was found to have a new 95% stenosis beyond is widely patent RCA stent in the distal RCA at which time a 3.5x12 mm Promus DES stent was inserted. His diagonal stent was patent. He had 20% mid LAD stenosis. In June 2013 a perfusion study was normal. In 2014 he experienced symptoms of weakness involving his right arm with slowness of speech and dysarthria. He was seen by the stroke team. A CT of his head did not show acute intracranial abnormality. MRI showed an equivocal tiny acute left pontomedullary junction stroke. He had mild atherosclerotic changes but otherwise unremarkable. He had seen a neurologist and was started on topiramate. He has been stable neurologically.  I saw him in 2016 and over the prior year he remained stable from a cardiovascular standpoint without recurrent chest pain symptomatology.  He goes to the gym several days per week.  He denies any significant episodes of recurrent chest pain.  There have been some short fleeting episodes.  He denies shortness of breath. He denies palpitations. He denies presyncope or syncope. He  has been tolerating atorvastatin 40 mg for his hyperlipidemia.  He denies bleeding with continuation of dual antiplatelet therapy.  He is unaware of any significant blood pressure elevation is current regimen consisting of amlodipine 5 mg, HCTZ 25 mg, lisinopril 40 mg, and metoprolol 50 mg twice a day.    He has obstructive sleep apnea and continues to use CPAP with 100% compliance.  He is unaware of breakthrough snoring.  He denies daytime  sleepiness.    I saw him in 2017 at which time he continued to remain stable.  I last saw him in June 2019 and he denied any recurrent episodes of chest pain, PND orthopnea.  He was exercising at least 3 days/week at the [facility] and was typically walking 1-1/2 to 2 miles at a time.  He continues to use CPAP with 100% compliance.  He had been bitten by a tick and received treatment for.with doxycycline.    Since my prior evaluation, he was seen the office in November 2020 by Edd Fabian, NP and most recently by Corine Shelter in January 2022.  During his January 2022 evaluation he had admitted to some vague chest tightness which seem to occur at onset of walking but then would resolve.  He underwent a nuclear perfusion study on April 10, 2020 which remained low risk and showed normal perfusion without scar or ischemia.  He also underwent carotid studies in February 2022 which were essentially normal in the carotids but the right subclavian artery was stenotic.  I saw him on Jun 30, 2020 at which time he continued to be active.  If he were to walk fast uphill at onset of walking he does experience a mild tightness which ultimately resolves with continued walking.  He continues to use CPAP with 100% compliance and is followed by [name].  He has been on a regimen of amlodipine 10 mg, HCTZ 12.5 mg, lisinopril 40 mg, and  metoprolol 50 mg twice a day for hypertension.  He continues to be on atorvastatin 40 mg for hyperlipidemia.  He is on Plavix 75 mg daily for platelet inhibition and is not on aspirin.  During that evaluation, with his class II anginal symptomatology I added isosorbide 30 mg to his medical regimen.   I last saw him on January 26, 2021 at which time he felt well.  He notes improvement in his prior chest tightness with the initiation of isosorbide.  Unfortunately has been under increased stress at home front since his wife suffered a significant stroke in September 2022 and has significant  residual deficits.  He continues to be on amlodipine 10 mg, HCTZ 12.5 mg, lisinopril 40 mg, isosorbide 30 mg and metoprolol tartrate 50 mg twice a day.  He states he has not seen [name] in a long time.  As result a download was obtained from his CPAP machine which verified excellent compliance with usage days but average usage was only 5 hours and 43 minutes.  AHI was 2.8 and his 95th percentile pressure was 9.1.  Epworth scale was increased at 14 consistent with residual daytime sleepiness.  I discussed with him optimal sleep the rest and at 7 and 9 hours.  Since I last saw him, he saw Dr. Pearlean Brownie in October 2023 for intermittent dizziness and transient imbalance.  His blood pressure was at home running in the 140 systolic range.  An MRI of his brain on November 26, 2021 showed progressive changes of chronic small vessel disease without acute abnormalities.   He has continued to be followed by Dr. Dorothyann Peng for primary care and last saw her on April 09, 2022.  Presently, he continues to care for his wife with prior stroke.  He denies any chest pain or shortness of breath.  He has been on amlodipine 10 mg, and HCTZ 12.5 mg, isosorbide 30 mg daily, lisinopril 40 mg, and metoprolol tartrate 50 mg for hypertension and CAD.  He is on atorvastatin 40 mg for hyperlipidemia.  He continues to be on Plavix 75 mg without aspirin.  He admits to 100% CPAP use.  He has 2 machines.  A download of one of the machines today shows a pressure range of 5 to 15 cm of water with his 95th percentile pressure of 7.9 and AHI 1.0.  Average use was 6 hours and 9 minutes per night.  He presents for evaluation.   Past Medical History:  Diagnosis Date   Allergic rhinitis    Cancer (HCC)    Prostate cancer   Coronary artery disease    History of prostate cancer    Hyperlipidemia    Hypertension    Hypokalemia    Nephritis    as a child   OSA (obstructive sleep apnea)    cpap machine at home   Stroke [facility])    "mini  stroke" TIA    Past Surgical History:  Procedure Laterality Date   CARDIAC CATHETERIZATION     CAROTID STENT  11-08 and 4-11   stent x 3 (12/2006 x 2; 2011 x 1 stent)   CATARACT EXTRACTION W/PHACO Left 08/21/2014   Procedure: PHACO EMULSION CATARACT EXTRACTION AND INTRAOCULAR LENS PLACEMENT (IOC) IMPLANT LEFT EYE;  Surgeon: Chalmers Guest, MD;  Location: [facility] OR;  Service: Ophthalmology;  Laterality: Left;   COLONOSCOPY  2015   COLONOSCOPY W/ POLYPECTOMY     CORONARY ANGIOPLASTY     HERNIA REPAIR Bilateral    x 2  different times   PROSTATE SURGERY     had cancer; radical resection 1997     Allergies  Allergen Reactions   Other Hives    SEAFOOD   Penicillins Other (See Comments)    "caused me to pass out"   Sulfa Antibiotics Hives    Current Outpatient Medications  Medication Sig Dispense Refill   amLODipine (NORVASC) 10 MG tablet TAKE 1 TABLET(10 MG) BY MOUTH DAILY 90 tablet 0   atorvastatin (LIPITOR) 40 MG tablet TAKE 1 TABLET(40 MG) BY MOUTH DAILY 90 tablet 0   Cholecalciferol (VITAMIN D) 2000 units CAPS Take 2,000 Units by mouth daily.     clopidogrel (PLAVIX) 75 MG tablet TAKE 1 TABLET BY MOUTH EVERY DAY 90 tablet 0   EPINEPHrine (EPIPEN 2-PAK) 0.3 mg/0.3 mL IJ SOAJ injection Inject 0.3 mg into the muscle as needed for anaphylaxis. 2 each 1   hydrochlorothiazide (MICROZIDE) 12.5 MG capsule TAKE 1 CAPSULE(12.5 MG) BY MOUTH DAILY 90 capsule 2   isosorbide mononitrate (IMDUR) 30 MG 24 hr tablet TAKE 1 TABLET(30 MG) BY MOUTH DAILY 90 tablet 3   lisinopril (ZESTRIL) 40 MG tablet Take 1 tablet (40 mg total) by mouth daily. 90 tablet 3   magnesium oxide (MAG-OX) 400 MG tablet Take 400 mg by mouth daily.     metoprolol tartrate (LOPRESSOR) 50 MG tablet TAKE 1 TABLET(50 MG) BY MOUTH TWICE DAILY 60 tablet 0   nitroGLYCERIN (NITROSTAT) 0.4 MG SL tablet Place 1 tablet (0.4 mg total) under the tongue every 5 (five) minutes as needed for chest pain. (Patient not taking: Reported on  06/17/2022) 25 tablet 2   No current facility-administered medications for this visit.    Socially he is married has 3 children and 2 grandchildren. He does drink occasional alcohol. There is no tobacco use. He does exercise occasionally.  ROS General: Negative; No fevers, chills, or night sweats;  HEENT: Negative; No changes in vision or hearing, sinus congestion, difficulty swallowing Pulmonary: Negative; No cough, wheezing, shortness of breath, hemoptysis Cardiovascular: Negative; No chest pain, presyncope, syncope, palpitations GI: Negative; No nausea, vomiting, diarrhea, or abdominal pain GU: History of prostate CA No dysuria, hematuria, or difficulty voiding Musculoskeletal: History of back discomfort intermittently; no myalgias, joint pain, or weakness Hematologic/Oncology: Negative; no easy bruising, bleeding Endocrine: Negative; no heat/cold intolerance; no diabetes Neuro: Negative; no changes in balance, headaches Skin: Negative; No rashes or skin lesions Psychiatric: Negative; No behavioral problems, depression Sleep: Obstructive sleep apnea on CPAP therapy.  He admits to Compliance.  No breakthrough snoring, daytime sleepiness, hypersomnolence, bruxism, restless legs, hypnogognic hallucinations, no cataplexy  An Epworth Sleepiness Scale score was calculated in the office today and this endorsed at 14 consistent with mild residual daytime sleepiness.  Other comprehensive 14 point system review is negative.  PE BP 120/76   Pulse (!) 52   Ht 5\' 10"  (1.778 m)   Wt 194 lb 3.2 oz (88.1 kg)   SpO2 97%   BMI 27.86 kg/m    Repeat blood pressure by me was 114/68  Wt Readings from Last 3 Encounters:  06/17/22 194 lb 3.2 oz (88.1 kg)  04/09/22 191 lb 3.2 oz (86.7 kg)  03/22/22 190 lb 3.2 oz (86.3 kg)   General: Alert, oriented, no distress.  Skin: normal turgor, no rashes, warm and dry HEENT: Normocephalic, atraumatic. Pupils equal round and reactive to light; sclera  anicteric; extraocular muscles intact;  Nose without nasal septal hypertrophy Mouth/Parynx benign; Mallinpatti scale 3 Neck: No JVD, no  carotid bruits; normal carotid upstroke Lungs: clear to ausculatation and percussion; no wheezing or rales Chest wall: without tenderness to palpitation Heart: PMI not displaced, RRR, s1 s2 normal, 1/6 systolic murmur, no diastolic murmur, no rubs, gallops, thrills, or heaves Abdomen: soft, nontender; no hepatosplenomehaly, BS+; abdominal aorta nontender and not dilated by palpation. Back: no CVA tenderness Pulses 2+ Musculoskeletal: full range of motion, normal strength, no joint deformities Extremities: no clubbing cyanosis or edema, Homan's sign negative  Neurologic: grossly nonfocal; Cranial nerves grossly wnl Psychologic: Normal mood and affect    June 17, 2022 ECG (independently read by me): Sinus bradycardia at 52, LBBB    January 26, 2021 ECG (independently read by me):  Sinus bradycardia at 57; LAD, LBBBB  An ECG was not done today but ECG from March 25, 2020 was personally reviewed and reveals sinus bradycardia at 54 bpm.  No ectopy.  Normal intervals.  August 10, 2017 ECG (independently read by me): Sinus Bradycardia 50 bpm.  Borderline criteria for LVH.  Normal intervals.  No ectopy.  June 2017 ECG (independently read by me): Sinus bradycardia 57 bpm.  Nonspecific T-wave in lead 03 March 2014 ECG (independently read by me): Sinus bradycardia at 57 bpm.  No significant ST segment changes.  November 2014 ECG sinus rhythm at 57 beats per minute. PR interval 190 ms QTc interval 377 ms  LABS: Laboratory from Dr. Velna Hatchet from May 05, 2017.: Vitamin D level 55.  Hemoglobin A1c 5.5.  Total cholesterol 118, HDL 37, LDL 70, triglycerides 53 and VLDL 11.  Hemoglobin hematocrit 15.0 and 43.4.  Creatinine 1.07.  Normal LFTs.  Is recent laboratory from Dr. Allyne Gee on September 11, 2020 was reviewed.  Total cholesterol 126, LDL cholesterol 71, HDL  38 and triglycerides 88.  BUN 13, creatinine 1.04.      Latest Ref Rng & Units 04/09/2022    2:12 PM 10/14/2021   10:53 AM 09/11/2020   11:48 AM  BMP  Glucose 70 - 99 mg/dL 92  94  82   BUN 8 - 27 mg/dL 13  13  13    Creatinine 0.76 - 1.27 mg/dL 8.11  9.14  7.82   BUN/Creat Ratio 10 - 24 13  14  13    Sodium 134 - 144 mmol/L 143  143  140   Potassium 3.5 - 5.2 mmol/L 4.0  3.7  4.1   Chloride 96 - 106 mmol/L 104  104  101   CO2 20 - 29 mmol/L 25  24  26    Calcium 8.6 - 10.2 mg/dL 8.7  8.9  8.6        Latest Ref Rng & Units 04/09/2022    2:12 PM 10/14/2021   10:53 AM 09/11/2020   11:48 AM  Hepatic Function  Total Protein 6.0 - 8.5 g/dL 6.5  6.2  6.9   Albumin 3.7 - 4.7 g/dL 4.1  3.8  4.4   AST 0 - 40 IU/L 19  17  19    ALT 0 - 44 IU/L 14  15  18    Alk Phosphatase 44 - 121 IU/L 68  64  84   Total Bilirubin 0.0 - 1.2 mg/dL 0.6  0.7  0.5        Latest Ref Rng & Units 04/09/2022    2:12 PM 10/14/2021   10:53 AM 09/11/2020   11:48 AM  CBC  WBC 3.4 - 10.8 x10E3/uL 5.3  4.9  5.8   Hemoglobin 13.0 - 17.7 g/dL 13.4  13.3  14.5   Hematocrit 37.5 - 51.0 % 37.8  38.7  42.7   Platelets 150 - 450 x10E3/uL 132  126  144     Lab Results  Component Value Date   MCV 92 04/09/2022   MCV 90 10/14/2021   MCV 88 09/11/2020    Lab Results  Component Value Date   TSH 0.473 10/14/2021   Lab Results  Component Value Date   HGBA1C 5.9 (H) 04/09/2022   Lipid Panel     Component Value Date/Time   CHOL 100 12/14/2021 1349   TRIG 46 12/14/2021 1349   HDL 41 12/14/2021 1349   CHOLHDL 2.4 12/14/2021 1349   CHOLHDL 3.1 08/05/2015 1050   VLDL 14 08/05/2015 1050   LDLCALC 47 12/14/2021 1349     RADIOLOGY: Ct Head (brain) Wo Murray  06/30/2012   *RADIOLOGY REPORT*  Clinical Data: Acute onset right upper and lower extremity weakness.  Right-sided foot drop.  CT HEAD WITHOUT Murray  Technique:  Contiguous axial images were obtained from the base of the skull through the vertex without  Murray.  Comparison: None.  Findings: No evidence of acute infarct, acute hemorrhage, mass lesion, mass effect or hydrocephalus.  Minimal periventricular low attenuation.  Small retention cysts or polyps are seen in the left maxillary sinus.  IMPRESSION:  1.  No acute findings. 2.  Minimal chronic microvascular white matter ischemic changes.   Original Report Authenticated By: Leanna Battles, M.D.   Eugene Murray  06/30/2012   *RADIOLOGY REPORT*  Clinical Data:  Right-sided weakness.  Hypertension.  History prostate cancer.  MRI BRAIN WITHOUT Murray MRA HEAD WITHOUT Murray  Technique: Multiplanar, multiecho pulse sequences of the brain and surrounding structures were obtained according to standard protocol without intravenous Murray.  Angiographic images of the head were obtained using MRA technique without Murray.  Comparison: 06/30/2012 head CT.  No comparison brain Eugene.  MRI HEAD  Findings:  Question tiny acute infarct left pontomedullary junction.  Moderate white matter type changes most consistent with result of small vessel disease.  No intracranial hemorrhage.  No hydrocephalus.  No intracranial mass lesion detected on this unenhanced exam.  Major intracranial vascular structures are patent.  Upper cervical cord is of decreased caliber of questionable significance/etiology.  Cervical medullary junction, pituitary region and pineal region unremarkable.  Exophthalmos.  IMPRESSION: Question tiny acute infarct left pontomedullary junction.  Moderate white matter type changes most consistent with result of small vessel disease.  Please see above  MRA HEAD  Findings: Anterior circulation without medium or large size vessel significant stenosis or occlusion.  Middle cerebral artery mild branch vessel irregularity bilaterally.  Mild narrowing distal M1 segment left middle cerebral artery.  Ectatic vertebral arteries and basilar artery.  Nonvisualization left PICA and right AICA.  Mild  branch vessel irregularity superior cerebellar artery and posterior cerebral artery bilaterally.  No aneurysm or vascular malformation noted.  IMPRESSION: Mild intracranial atherosclerotic type changes as detailed above the   Original Report Authenticated By: Lacy Duverney, M.D.   Eugene Murray  06/30/2012   *RADIOLOGY REPORT*  Clinical Data:  Right-sided weakness.  Hypertension.  History prostate cancer.  MRI BRAIN WITHOUT Murray MRA HEAD WITHOUT Murray  Technique: Multiplanar, multiecho pulse sequences of the brain and surrounding structures were obtained according to standard protocol without intravenous Murray.  Angiographic images of the head were obtained using MRA technique without Murray.  Comparison: 06/30/2012 head CT.  No comparison brain Eugene.  MRI  HEAD  Findings:  Question tiny acute infarct left pontomedullary junction.  Moderate white matter type changes most consistent with result of small vessel disease.  No intracranial hemorrhage.  No hydrocephalus.  No intracranial mass lesion detected on this unenhanced exam.  Major intracranial vascular structures are patent.  Upper cervical cord is of decreased caliber of questionable significance/etiology.  Cervical medullary junction, pituitary region and pineal region unremarkable.  Exophthalmos.  IMPRESSION: Question tiny acute infarct left pontomedullary junction.  Moderate white matter type changes most consistent with result of small vessel disease.  Please see above  MRA HEAD  Findings: Anterior circulation without medium or large size vessel significant stenosis or occlusion.  Middle cerebral artery mild branch vessel irregularity bilaterally.  Mild narrowing distal M1 segment left middle cerebral artery.  Ectatic vertebral arteries and basilar artery.  Nonvisualization left PICA and right AICA.  Mild branch vessel irregularity superior cerebellar artery and posterior cerebral artery bilaterally.  No aneurysm or vascular malformation  noted.  IMPRESSION: Mild intracranial atherosclerotic type changes as detailed above the   Original Report Authenticated By: Lacy Duverney, M.D.    IMPRESSION: Encounter Diagnoses      Essential hypertension    CAD S/P percutaneous coronary angioplasty    OSA (obstructive sleep apnea)    Hyperlipidemia with target LDL less than 70    History of CVA (cerebrovascular accident)     ASSESSMENT AND PLAN: Eugene Murray is an 82 year old African-American male who has CAD and underwent initial percutaneous coronary intervention in November 2008 to his diagonal and RCA.  In  April 2011 he was found to have a new 95% stenosis beyond his distal RCA stent for which a new DES stent was inserted.  He underwent a follow-up nuclear perfusion study on April 10, 2020 after experiencing some mild chest pressure with walking onset.  This was essentially normal with nuclear stress EF at 55% without evidence for prior infarct or ischemia.  Due to a right carotid bruit he underwent carotid ultrasound on April 10, 2020 which did not reveal any carotid disease.  However the right subclavian artery was stenotic.  He had normal flow hemodynamics in the left subclavian artery.  When I saw him in May 2022 he was experiencing mild class II anginal symptomatology with a rapid walk.  At that time I added isosorbide 30 mg.  He noticed significant benefit since this was added to his current medical regimen.  Presently, his blood pressure today is well-controlled and he is not having any anginal symptomatology on amlodipine 10 mg, HCTZ 12.5 mg, isosorbide 30 mg, lisinopril 40 mg, and metoprolol tartrate 50 mg twice a day.  He continues to be on atorvastatin 40 mg daily.  Laboratory from October 2023 showed total cholesterol 100, triglycerides 46, LDL cholesterol 47.  Laboratory from April 09, 2022 shows potassium 4.0.  BUN 13 creatinine 0.97.  He continues to care for his wife following his stroke.  He continues to use CPAP  therapy and has 2 machines and admits to 100% use with combined machines.  He has not seen [name] in some time but his most recent download shows excellent AHI at 1.0 with his current pressure setting.  He will continue current therapy.  ECG is stable with sinus bradycardia and left bundle branch block.  I will see him in 1 year for reevaluation or sooner as needed.   Nicki Guadalajara, MD, [facility], ABSM Diplomate, American Board of Sleep Medicine  06/27/2022 12:10 PM

## 2022-06-17 NOTE — Patient Instructions (Signed)
Medication Instructions:  No Changes *If you need a refill on your cardiac medications before your next appointment, please call your pharmacy*  Lab Work: None Ordered   Testing/Procedures: None Ordered   Follow-Up: At [facility], you and your health needs are our priority.  As part of our continuing mission to provide you with exceptional heart care, we have created designated Provider Care Teams.  These Care Teams include your primary Cardiologist (physician) and Advanced Practice Providers (APPs -  Physician Assistants and Nurse Practitioners) who all work together to provide you with the care you need, when you need it.  We recommend signing up for the patient portal called "MyChart".  Sign up information is provided on this After Visit Summary.  MyChart is used to connect with patients for Virtual Visits (Telemedicine).  Patients are able to view lab/test results, encounter notes, upcoming appointments, etc.  Non-urgent messages can be sent to your provider as well.   To learn more about what you can do with MyChart, go to [email or web address].    Your next appointment:   1 year(s)  Provider:   Nicki Guadalajara, MD

## 2022-06-27 ENCOUNTER — Encounter: Payer: Self-pay | Admitting: Cardiovascular Disease

## 2022-07-08 ENCOUNTER — Encounter: Payer: Self-pay | Admitting: Internal Medicine

## 2022-07-08 ENCOUNTER — Other Ambulatory Visit: Payer: Self-pay | Admitting: Cardiovascular Disease

## 2022-07-08 ENCOUNTER — Ambulatory Visit (INDEPENDENT_AMBULATORY_CARE_PROVIDER_SITE_OTHER): Payer: Medicare Other | Admitting: Internal Medicine

## 2022-07-08 VITALS — BP 104/78 | HR 53 | Temp 98.5°F | Ht 70.0 in | Wt 190.2 lb

## 2022-07-08 DIAGNOSIS — R7309 Other abnormal glucose: Secondary | ICD-10-CM

## 2022-07-08 DIAGNOSIS — R159 Full incontinence of feces: Secondary | ICD-10-CM

## 2022-07-08 DIAGNOSIS — R351 Nocturia: Secondary | ICD-10-CM | POA: Diagnosis not present

## 2022-07-08 DIAGNOSIS — Z636 Dependent relative needing care at home: Secondary | ICD-10-CM | POA: Diagnosis not present

## 2022-07-08 DIAGNOSIS — I119 Hypertensive heart disease without heart failure: Secondary | ICD-10-CM

## 2022-07-08 DIAGNOSIS — I25118 Atherosclerotic heart disease of native coronary artery with other forms of angina pectoris: Secondary | ICD-10-CM

## 2022-07-08 DIAGNOSIS — E7800 Pure hypercholesterolemia, unspecified: Secondary | ICD-10-CM

## 2022-07-08 LAB — HEMOGLOBIN A1C
Est. average glucose Bld gHb Est-mCnc: 120 mg/dL
Hgb A1c MFr Bld: 5.8 % — ABNORMAL HIGH (ref 4.8–5.6)

## 2022-07-08 NOTE — Progress Notes (Signed)
I,Eugene Murray,acting as a scribe for Eugene Aliment, MD.,have documented all relevant documentation on the behalf of Eugene Aliment, MD,as directed by  Eugene Aliment, MD while in the presence of Eugene Aliment, MD.    Subjective:     Patient ID: Eugene Murray , male    DOB: 04-15-1940 , 82 y.o.   MRN: [id number]   Chief Complaint  Patient presents with   Hypertension   Hyperlipidemia    HPI  He presents today for BP f/u. He reports compliance with meds. Denies headache, chest pain, and SOB. 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   He states he had fever for the past two days. Temp readings were 99.5 & 100. He did an at home covid test which came back negative. He denies ill contacts.  He has an forehead temp reader at home.   Patient has brought in home cuff: BP 111/71       Hypertension This is a chronic problem. The current episode started more than 1 year ago. The problem has been gradually improving since onset. The problem is controlled. Risk factors for coronary artery disease include dyslipidemia and male gender. The current treatment provides moderate improvement. Hypertensive end-organ damage includes CAD/MI.  Hyperlipidemia This is a chronic problem. The current episode started more than 1 year ago. He has no history of diabetes. Current antihyperlipidemic treatment includes statins.     Past Medical History:  Diagnosis Date   Allergic rhinitis    Cancer (HCC)    Prostate cancer   Coronary artery disease    History of prostate cancer    Hyperlipidemia    Hypertension    Hypokalemia    Nephritis    as a child   OSA (obstructive sleep apnea)    cpap machine at home   Stroke [facility])    "mini stroke" TIA     Family History  Problem Relation Age of Onset   Colon cancer Father        possibly age 82 y.o diagnosed    Stroke Mother    Asthma Sister      Current Outpatient Medications:    amLODipine (NORVASC) 10 MG  tablet, TAKE 1 TABLET(10 MG) BY MOUTH DAILY, Disp: 90 tablet, Rfl: 0   atorvastatin (LIPITOR) 40 MG tablet, TAKE 1 TABLET(40 MG) BY MOUTH DAILY, Disp: 90 tablet, Rfl: 0   Cholecalciferol (VITAMIN D) 2000 units CAPS, Take 2,000 Units by mouth daily., Disp: , Rfl:    clopidogrel (PLAVIX) 75 MG tablet, TAKE 1 TABLET BY MOUTH EVERY DAY, Disp: 90 tablet, Rfl: 0   EPINEPHrine (EPIPEN 2-PAK) 0.3 mg/0.3 mL IJ SOAJ injection, Inject 0.3 mg into the muscle as needed for anaphylaxis., Disp: 2 each, Rfl: 1   hydrochlorothiazide (MICROZIDE) 12.5 MG capsule, TAKE 1 CAPSULE(12.5 MG) BY MOUTH DAILY, Disp: 90 capsule, Rfl: 2   lisinopril (ZESTRIL) 40 MG tablet, Take 1 tablet (40 mg total) by mouth daily., Disp: 90 tablet, Rfl: 3   magnesium oxide (MAG-OX) 400 MG tablet, Take 400 mg by mouth daily., Disp: , Rfl:    metoprolol tartrate (LOPRESSOR) 50 MG tablet, TAKE 1 TABLET(50 MG) BY MOUTH TWICE DAILY, Disp: 60 tablet, Rfl: 0   isosorbide mononitrate (IMDUR) 30 MG 24 hr tablet, TAKE 1 TABLET(30 MG) BY MOUTH DAILY NEED APPOINTMENT FOR REFILLS, Disp: 30 tablet, Rfl: 0   nitroGLYCERIN (NITROSTAT) 0.4 MG SL tablet, Place 1 tablet (0.4 mg total) under  the tongue every 5 (five) minutes as needed for chest pain. (Patient not taking: Reported on 06/17/2022), Disp: 25 tablet, Rfl: 2   Allergies  Allergen Reactions   Other Hives    SEAFOOD   Penicillins Other (See Comments)    "caused me to pass out"   Sulfa Antibiotics Hives     Review of Systems  Constitutional:  Positive for fatigue.  HENT: Negative.    Cardiovascular: Negative.   Gastrointestinal:        He c/o fecal smearing. This is a new issue. Not sure what has contributed to his sx.   Genitourinary:  Positive for frequency.  Musculoskeletal: Negative.   Skin: Negative.   Allergic/Immunologic: Negative.   Neurological: Negative.   Hematological: Negative.      Today's Vitals   07/08/22 1423  BP: 104/78  Pulse: (!) 53  Temp: 98.5 F (36.9 C)   SpO2: 98%  Weight: 190 lb 3.2 oz (86.3 kg)  Height: 5\' 10"  (1.778 m)   Body mass index is 27.29 kg/m.  The ASCVD Risk score (Arnett DK, et al., 2019) failed to calculate for the following reasons:   The 2019 ASCVD risk score is only valid for ages 50 to 68   The patient has a prior MI or stroke diagnosis ++   Objective:  Physical Exam Vitals and nursing note reviewed.  Constitutional:      Appearance: Normal appearance.  Eyes:     Extraocular Movements: Extraocular movements intact.  Cardiovascular:     Rate and Rhythm: Normal rate and regular rhythm.     Heart sounds: Normal heart sounds.  Pulmonary:     Effort: Pulmonary effort is normal.     Breath sounds: Normal breath sounds.  Musculoskeletal:     Cervical back: Normal range of motion.  Skin:    General: Skin is warm.  Neurological:     General: No focal deficit present.     Mental Status: He is alert.  Psychiatric:        Mood and Affect: Mood normal.      Assessment And Plan:     1. Hypertensive heart disease without heart failure Comments: Chronic, well controlled. No med changes. He is reminded to follow low sodium diet.  2. Atherosclerosis of native coronary artery of native heart with other form of angina pectoris (HCC) Comments: Chronic, LDL goal < 70. He will c/w atorvastatin therapy. Encouraged to follow a heart healthy lifestyle.  3. Nocturia Comments: He plans to speak to Urologist about this further.  4. Abnormal glucose Comments: Previous labs reviewed, his a1c has been elevated in the past. I will recheck this today, he is encouraged to decrease intake of processed meats. - Hemoglobin A1c  5. Incontinence of feces, unspecified fecal incontinence type Comments: Also w/ fecal smearing. I suggest pelvic floor PT. He declines, he prefers to work on pelvic floor exercises at home. He advised to increase fiber intake. I will refer to GI for further evaluation.  6. Caregiver stress Comments: I will  look into [facility] as a resource for the patient.    Return add me to AWV day in August.   Patient was given opportunity to ask questions. Patient verbalized understanding of the plan and was able to repeat key elements of the plan. All questions were answered to their satisfaction.   I, Eugene Aliment, MD, have reviewed all documentation for this visit. The documentation on 07/08/22 for the exam, diagnosis, procedures, and orders are  all accurate and complete.   IF YOU HAVE BEEN REFERRED TO A SPECIALIST, IT MAY TAKE 1-2 WEEKS TO SCHEDULE/PROCESS THE REFERRAL. IF YOU HAVE NOT HEARD FROM US/SPECIALIST IN TWO WEEKS, PLEASE GIVE [place] A CALL AT [phone].   THE PATIENT IS ENCOURAGED TO PRACTICE SOCIAL DISTANCING DUE TO THE COVID-19 PANDEMIC.

## 2022-07-08 NOTE — Patient Instructions (Signed)
Pelvic floor exercises.

## 2022-07-09 ENCOUNTER — Ambulatory Visit: Payer: Self-pay

## 2022-07-09 NOTE — Patient Instructions (Signed)
Visit Information  Thank you for taking time to visit with me today. Please don't hesitate to contact me if I can be of assistance to you.   Following are the goals we discussed today:   Goals Addressed               This Visit's Progress     Patient Stated     COMPLETED: I sometimes feel a Yaire Kreher off balance (pt-stated)        Care Coordination Interventions: Evaluation of current treatment plan related to hypertension self management and patient's adherence to plan as established by provider Review of patient status, including review of consultant's reports, relevant laboratory and other test results, and medications completed Per chart review, patient continues to use CPAP therapy and has 2 machines and admits to 100% use with combined machines. He has not seen [name] in some time but his most recent download shows excellent AHI at 1.0 with his current pressure setting. He will continue current therapy.  Determined patient understands his prescribed treatment plan, he voices having no c/o at this time  Instructed patient to notify his doctor promptly of new symptoms or concerns       Other     To monitor BP at home on a regular basis        Care Coordination Interventions: Evaluation of current treatment plan related to hypertension self management and patient's adherence to plan as established by provider Reviewed medications with patient and discussed importance of compliance Counseled on the importance of exercise goals with target of 150 minutes per week Advised patient, providing education and rationale, to monitor blood pressure daily and record, calling PCP for findings outside established parameters Provided education on prescribed diet low Sodium           Our next appointment is by telephone on 10/25/22 at 12:00 PM  Please call the care guide team at [phone] if you need to cancel or reschedule your appointment.   If you are experiencing a Mental Health or  Behavioral Health Crisis or need someone to talk to, please call 1-800-273-TALK (toll free, 24 hour hotline) go to [facility] [address], [place] [phone])  Patient verbalizes understanding of instructions and care plan provided today and agrees to view in MyChart. Active MyChart status and patient understanding of how to access instructions and care plan via MyChart confirmed with patient.     Delsa Sale, RN, BSN, CCM Care Management Coordinator [facility] Care Management  Direct Phone: [phone]

## 2022-07-09 NOTE — Patient Outreach (Signed)
  Care Coordination   Follow Up Visit Note   07/09/2022 Name: [name] MRN: [id number] DOB: 28-Nov-1940  Eugene Murray is a 82 y.o. year old male who sees Eugene Peng, MD for primary care. I spoke with  Eugene Murray by phone today.  What matters to the patients health and wellness today?  Patient would like to maintain his current health status.     Goals Addressed               This Visit's Progress     Patient Stated     COMPLETED: I sometimes feel a Eugene Murray off balance (pt-stated)        Care Coordination Interventions: Evaluation of current treatment plan related to hypertension self management and patient's adherence to plan as established by provider Review of patient status, including review of consultant's reports, relevant laboratory and other test results, and medications completed Per chart review, patient continues to use CPAP therapy and has 2 machines and admits to 100% use with combined machines. He has not seen [name] in some time but his most recent download shows excellent AHI at 1.0 with his current pressure setting. He will continue current therapy.  Determined patient understands his prescribed treatment plan, he voices having no c/o at this time  Instructed patient to notify his doctor promptly of new symptoms or concerns       Other     To monitor BP at home on a regular basis        Care Coordination Interventions: Evaluation of current treatment plan related to hypertension self management and patient's adherence to plan as established by provider Reviewed medications with patient and discussed importance of compliance Counseled on the importance of exercise goals with target of 150 minutes per week Advised patient, providing education and rationale, to monitor blood pressure daily and record, calling PCP for findings outside established parameters Provided education on prescribed diet low Sodium       Interventions Today    Flowsheet Row  Most Recent Value  Chronic Disease   Chronic disease during today's visit Hypertension (HTN), Other  [CAD, OSA]  General Interventions   General Interventions Discussed/Reviewed General Interventions Discussed, General Interventions Reviewed, Doctor Visits, Durable Medical Equipment (DME)  Doctor Visits Discussed/Reviewed Doctor Visits Discussed, Doctor Visits Reviewed, PCP, Specialist  Durable Medical Equipment (DME) BP Cuff  Education Interventions   Education Provided Provided Education  Provided Verbal Education On When to see the doctor          SDOH assessments and interventions completed:  No     Care Coordination Interventions:  Yes, provided   Follow up plan: Follow up call scheduled for 10/25/22 @12 :00 PM    Encounter Outcome:  Pt. Visit Completed

## 2022-07-13 ENCOUNTER — Other Ambulatory Visit: Payer: Self-pay | Admitting: Cardiovascular Disease

## 2022-07-13 ENCOUNTER — Telehealth: Payer: Self-pay | Admitting: Cardiovascular Disease

## 2022-07-13 MED ORDER — ISOSORBIDE MONONITRATE ER 30 MG PO TB24: ORAL_TABLET | ORAL | 0 refills | Status: DC

## 2022-07-13 NOTE — Telephone Encounter (Signed)
*  STAT* If patient is at the pharmacy, call can be transferred to refill team.   1. Which medications need to be refilled? (please list name of each medication and dose if known) isosorbide mononitrate (IMDUR) 30 MG 24 hr tablet   2. Which pharmacy/location (including street and city if local pharmacy) is medication to be sent to? [id number] - [place], [place] - [address] AT [address]   3. Do they need a 30 day or 90 day supply? 90

## 2022-07-28 ENCOUNTER — Other Ambulatory Visit: Payer: Self-pay | Admitting: Cardiovascular Disease

## 2022-08-16 ENCOUNTER — Telehealth: Payer: Self-pay | Admitting: Cardiovascular Disease

## 2022-08-16 ENCOUNTER — Other Ambulatory Visit: Payer: Self-pay | Admitting: Cardiovascular Disease

## 2022-08-16 MED ORDER — METOPROLOL TARTRATE 50 MG PO TABS: 50.0000 mg | ORAL_TABLET | Freq: Two times a day (BID) | ORAL | 0 refills | Status: DC

## 2022-08-16 MED ORDER — ISOSORBIDE MONONITRATE ER 30 MG PO TB24: ORAL_TABLET | ORAL | 0 refills | Status: DC

## 2022-08-16 NOTE — Telephone Encounter (Signed)
Rx(s) sent to pharmacy electronically.  

## 2022-08-16 NOTE — Telephone Encounter (Signed)
*  STAT* If patient is at the pharmacy, call can be transferred to refill team.   1. Which medications need to be refilled? (please list name of each medication and dose if known)   metoprolol tartrate (LOPRESSOR) 50 MG tablet  isosorbide mononitrate (IMDUR) 30 MG 24 hr tablet (completely out)  2. Which pharmacy/location (including street and city if local pharmacy) is medication to be sent to?  [id number] - [place], [place] - [address] AT [address]   3. Do they need a 30 day or 90 day supply?   90 day  Patient stated he is running out of these medications.  Patient had office visit on 4/18.

## 2022-08-23 ENCOUNTER — Other Ambulatory Visit: Payer: Self-pay | Admitting: Internal Medicine

## 2022-08-24 ENCOUNTER — Other Ambulatory Visit: Payer: Self-pay | Admitting: Internal Medicine

## 2022-09-01 ENCOUNTER — Other Ambulatory Visit: Payer: Self-pay | Admitting: Cardiovascular Disease

## 2022-09-01 ENCOUNTER — Telehealth: Payer: Self-pay | Admitting: Cardiovascular Disease

## 2022-09-01 DIAGNOSIS — Z79899 Other long term (current) drug therapy: Secondary | ICD-10-CM

## 2022-09-01 MED ORDER — ISOSORBIDE MONONITRATE ER 30 MG PO TB24: 30.0000 mg | ORAL_TABLET | Freq: Every day | ORAL | 3 refills | Status: DC

## 2022-09-01 NOTE — Telephone Encounter (Signed)
IMDUR sent to pharmacy on file

## 2022-09-01 NOTE — Telephone Encounter (Signed)
Patient calling back to say that his prescription was denied, we sent a refill on 6/17. Calling to find out why our office denied. Please advise

## 2022-09-09 DIAGNOSIS — H2511 Age-related nuclear cataract, right eye: Secondary | ICD-10-CM | POA: Diagnosis not present

## 2022-09-09 DIAGNOSIS — H43813 Vitreous degeneration, bilateral: Secondary | ICD-10-CM | POA: Diagnosis not present

## 2022-10-12 ENCOUNTER — Other Ambulatory Visit: Payer: Self-pay | Admitting: Cardiovascular Disease

## 2022-10-13 ENCOUNTER — Other Ambulatory Visit: Payer: Self-pay | Admitting: Cardiovascular Disease

## 2022-10-20 NOTE — Patient Instructions (Signed)
Hypertension, Adult High blood pressure (hypertension) is when the force of blood pumping through the arteries is too strong. The arteries are the blood vessels that carry blood from the heart throughout the body. Hypertension forces the heart to work harder to pump blood and may cause arteries to become narrow or stiff. Untreated or uncontrolled hypertension can lead to a heart attack, heart failure, a stroke, kidney disease, and other problems. A blood pressure reading consists of a higher number over a lower number. Ideally, your blood pressure should be below 120/80. The first ("top") number is called the systolic pressure. It is a measure of the pressure in your arteries as your heart beats. The second ("bottom") number is called the diastolic pressure. It is a measure of the pressure in your arteries as the heart relaxes. What are the causes? The exact cause of this condition is not known. There are some conditions that result in high blood pressure. What increases the risk? Certain factors may make you more likely to develop high blood pressure. Some of these risk factors are under your control, including: Smoking. Not getting enough exercise or physical activity. Being overweight. Having too much fat, sugar, calories, or salt (sodium) in your diet. Drinking too much alcohol. Other risk factors include: Having a personal history of heart disease, diabetes, high cholesterol, or kidney disease. Stress. Having a family history of high blood pressure and high cholesterol. Having obstructive sleep apnea. Age. The risk increases with age. What are the signs or symptoms? High blood pressure may not cause symptoms. Very high blood pressure (hypertensive crisis) may cause: Headache. Fast or irregular heartbeats (palpitations). Shortness of breath. Nosebleed. Nausea and vomiting. Vision changes. Severe chest pain, dizziness, and seizures. How is this diagnosed? This condition is diagnosed by  measuring your blood pressure while you are seated, with your arm resting on a flat surface, your legs uncrossed, and your feet flat on the floor. The cuff of the blood pressure monitor will be placed directly against the skin of your upper arm at the level of your heart. Blood pressure should be measured at least twice using the same arm. Certain conditions can cause a difference in blood pressure between your right and left arms. If you have a high blood pressure reading during one visit or you have normal blood pressure with other risk factors, you may be asked to: Return on a different day to have your blood pressure checked again. Monitor your blood pressure at home for 1 week or longer. If you are diagnosed with hypertension, you may have other blood or imaging tests to help your health care provider understand your overall risk for other conditions. How is this treated? This condition is treated by making healthy lifestyle changes, such as eating healthy foods, exercising more, and reducing your alcohol intake. You may be referred for counseling on a healthy diet and physical activity. Your health care provider may prescribe medicine if lifestyle changes are not enough to get your blood pressure under control and if: Your systolic blood pressure is above 130. Your diastolic blood pressure is above 80. Your personal target blood pressure may vary depending on your medical conditions, your age, and other factors. Follow these instructions at home: Eating and drinking  Eat a diet that is high in fiber and potassium, and low in sodium, added sugar, and fat. An example of this eating plan is called the DASH diet. DASH stands for Dietary Approaches to Stop Hypertension. To eat this way: Eat   plenty of fresh fruits and vegetables. Try to fill one half of your plate at each meal with fruits and vegetables. Eat whole grains, such as whole-wheat pasta, brown rice, or whole-grain bread. Fill about one  fourth of your plate with whole grains. Eat or drink low-fat dairy products, such as skim milk or low-fat yogurt. Avoid fatty cuts of meat, processed or cured meats, and poultry with skin. Fill about one fourth of your plate with lean proteins, such as fish, chicken without skin, beans, eggs, or tofu. Avoid pre-made and processed foods. These tend to be higher in sodium, added sugar, and fat. Reduce your daily sodium intake. Many people with hypertension should eat less than 1,500 mg of sodium a day. Do not drink alcohol if: Your health care provider tells you not to drink. You are pregnant, may be pregnant, or are planning to become pregnant. If you drink alcohol: Limit how much you have to: 0-1 drink a day for women. 0-2 drinks a day for men. Know how much alcohol is in your drink. In the [place] one drink equals one 12 oz bottle of beer (355 mL), one 5 oz glass of wine (148 mL), or one 1 oz glass of hard liquor (44 mL). Lifestyle  Work with your health care provider to maintain a healthy body weight or to lose weight. Ask what an ideal weight is for you. Get at least 30 minutes of exercise that causes your heart to beat faster (aerobic exercise) most days of the week. Activities may include walking, swimming, or biking. Include exercise to strengthen your muscles (resistance exercise), such as Pilates or lifting weights, as part of your weekly exercise routine. Try to do these types of exercises for 30 minutes at least 3 days a week. Do not use any products that contain nicotine or tobacco. These products include cigarettes, chewing tobacco, and vaping devices, such as e-cigarettes. If you need help quitting, ask your health care provider. Monitor your blood pressure at home as told by your health care provider. Keep all follow-up visits. This is important. Medicines Take over-the-counter and prescription medicines only as told by your health care provider. Follow directions carefully. Blood  pressure medicines must be taken as prescribed. Do not skip doses of blood pressure medicine. Doing this puts you at risk for problems and can make the medicine less effective. Ask your health care provider about side effects or reactions to medicines that you should watch for. Contact a health care provider if you: Think you are having a reaction to a medicine you are taking. Have headaches that keep coming back (recurring). Feel dizzy. Have swelling in your ankles. Have trouble with your vision. Get help right away if you: Develop a severe headache or confusion. Have unusual weakness or numbness. Feel faint. Have severe pain in your chest or abdomen. Vomit repeatedly. Have trouble breathing. These symptoms may be an emergency. Get help right away. Call 911. Do not wait to see if the symptoms will go away. Do not drive yourself to the hospital. Summary Hypertension is when the force of blood pumping through your arteries is too strong. If this condition is not controlled, it may put you at risk for serious complications. Your personal target blood pressure may vary depending on your medical conditions, your age, and other factors. For most people, a normal blood pressure is less than 120/80. Hypertension is treated with lifestyle changes, medicines, or a combination of both. Lifestyle changes include losing weight, eating a healthy,   low-sodium diet, exercising more, and limiting alcohol. This information is not intended to replace advice given to you by your health care provider. Make sure you discuss any questions you have with your health care provider. Document Revised: 12/23/2020 Document Reviewed: 12/23/2020 Elsevier Patient Education  2024 [employer]  

## 2022-10-20 NOTE — Progress Notes (Signed)
I,Victoria T Deloria Lair, CMA,acting as a Neurosurgeon for Gwynneth Aliment, MD.,have documented all relevant documentation on the behalf of Gwynneth Aliment, MD,as directed by  Gwynneth Aliment, MD while in the presence of Gwynneth Aliment, MD.  Subjective:  Patient ID: Eugene Murray , male    DOB: 1940-05-01 , 82 y.o.   MRN: [id number]  Chief Complaint  Patient presents with   Hypertension    HPI  He presents today for BP & abnormal glucose f/u. He reports compliance with meds. Denies headache, chest pain, and SOB.   AWV completed with [facility] Advisor: [place].       Hypertension This is a chronic problem. The current episode started more than 1 year ago. The problem has been gradually improving since onset. The problem is controlled. Risk factors for coronary artery disease include dyslipidemia and male gender. The current treatment provides moderate improvement. Hypertensive end-organ damage includes CAD/MI.  Hyperlipidemia This is a chronic problem. The current episode started more than 1 year ago. He has no history of diabetes. Current antihyperlipidemic treatment includes statins.     Past Medical History:  Diagnosis Date   Allergic rhinitis    Allergy    Cancer (HCC)    Prostate cancer   Cataract    Coronary artery disease    History of prostate cancer    Hyperlipidemia    Hypertension    Hypokalemia    Nephritis    as a child   OSA (obstructive sleep apnea)    cpap machine at home   Sleep apnea    Stroke [facility])    "mini stroke" TIA     Family History  Problem Relation Age of Onset   Colon cancer Father        possibly age 82 y.o diagnosed    Cancer Father    Stroke Mother    Asthma Sister      Current Outpatient Medications:    amLODipine (NORVASC) 10 MG tablet, TAKE 1 TABLET(10 MG) BY MOUTH DAILY, Disp: 90 tablet, Rfl: 0   atorvastatin (LIPITOR) 40 MG tablet, TAKE 1 TABLET(40 MG) BY MOUTH DAILY, Disp: 30 tablet, Rfl: 0   Cholecalciferol (VITAMIN D) 2000 units CAPS,  Take 2,000 Units by mouth daily., Disp: , Rfl:    clopidogrel (PLAVIX) 75 MG tablet, TAKE 1 TABLET BY MOUTH EVERY DAY, Disp: 90 tablet, Rfl: 0   EPINEPHrine (EPIPEN 2-PAK) 0.3 mg/0.3 mL IJ SOAJ injection, Inject 0.3 mg into the muscle as needed for anaphylaxis., Disp: 2 each, Rfl: 1   hydrochlorothiazide (MICROZIDE) 12.5 MG capsule, TAKE 1 CAPSULE(12.5 MG) BY MOUTH DAILY, Disp: 90 capsule, Rfl: 2   isosorbide mononitrate (IMDUR) 30 MG 24 hr tablet, Take 1 tablet (30 mg total) by mouth daily., Disp: 90 tablet, Rfl: 3   lisinopril (ZESTRIL) 40 MG tablet, Take 1 tablet (40 mg total) by mouth daily., Disp: 90 tablet, Rfl: 3   magnesium oxide (MAG-OX) 400 MG tablet, Take 400 mg by mouth daily., Disp: , Rfl:    metoprolol tartrate (LOPRESSOR) 50 MG tablet, Take 1 tablet (50 mg total) by mouth 2 (two) times daily. PT. NEEDS TO MAKE AN APPOINTMENT IN ORDER TO RECEIVE FUTURE REFILLS. FIRST ATTEMPT., Disp: 60 tablet, Rfl: 0   nitroGLYCERIN (NITROSTAT) 0.4 MG SL tablet, Place 1 tablet (0.4 mg total) under the tongue every 5 (five) minutes as needed for chest pain., Disp: 25 tablet, Rfl: 2   Allergies  Allergen Reactions   Other Hives  SEAFOOD   Penicillins Other (See Comments)    "caused me to pass out"   Sulfa Antibiotics Hives     Review of Systems  Constitutional: Negative.   HENT: Negative.    Respiratory:  Positive for cough.        He c/o persistent cough. No fever/chills. States he coughs up phlegm. He states it is often blood tinged.   Cardiovascular: Negative.   Gastrointestinal:        He reports having difficulty swallowing. Feels as if things get stuck in his throat. Denies choking.   Skin: Negative.   Allergic/Immunologic: Negative.   Neurological: Negative.   Hematological: Negative.      Today's Vitals   10/21/22 1047  BP: 122/70  Temp: 97.6 F (36.4 C)  SpO2: 98%  Weight: 189 lb (85.7 kg)  Height: 5\' 8"  (1.727 m)   Body mass index is 28.74 kg/m.  Wt Readings from  Last 3 Encounters:  10/21/22 189 lb (85.7 kg)  10/21/22 189 lb 12.8 oz (86.1 kg)  07/08/22 190 lb 3.2 oz (86.3 kg)     Objective:  Physical Exam Vitals and nursing note reviewed.  Constitutional:      Appearance: Normal appearance.  HENT:     Head: Normocephalic and atraumatic.  Eyes:     Extraocular Movements: Extraocular movements intact.  Cardiovascular:     Rate and Rhythm: Normal rate and regular rhythm.     Heart sounds: Normal heart sounds.  Pulmonary:     Effort: Pulmonary effort is normal.     Breath sounds: Normal breath sounds.  Musculoskeletal:     Cervical back: Normal range of motion.  Skin:    General: Skin is warm.  Neurological:     General: No focal deficit present.     Mental Status: He is alert.  Psychiatric:        Mood and Affect: Mood normal.         Assessment And Plan:  Hypertensive heart disease without heart failure Assessment & Plan: Chronic, well controlled. He will continue with amlodipine 10mg , hydrochlorothiazide 12.5mg  daily, lisinopril 40mg  daily and metoprolol 50mg  twice daily. He is encouraged to follow heart healthy, low sodium diet.   Orders: -     CMP14+EGFR -     Lipid panel  Atherosclerosis of native coronary artery of native heart with other form of angina pectoris [facility]) Assessment & Plan: Chronic, he is s/p PCI in 2008 and 2011.  He is encouraged to follow a heart healthy lifestyle. He will continue with isosorbide mononitrate 30mg  daily, atorvastatin 40mg  daily and metoprolol tartrate 50mg  twice daily. He is also still on Plavix as per Cardiology.   Orders: -     Lipid panel  Other specified disorders of carbohydrate metabolism (HCC) -     Hemoglobin A1c  Chronic cough Assessment & Plan: I will check CXR, he has h/o tobacco use. PND is also a possibility. denies increased belching and heartburn, but, GERD is also a possibility.   Orders: -     CBC -     DG Chest 2 View; Future  Thrombocytopenia (HCC) Assessment  & Plan: I will check CBC to assess platelet count.   Dysphagia, unspecified type -     DG ESOPHAGUS W SINGLE CM (SOL OR THIN BA); Future     Return in about 6 months (around 04/23/2023), or bp/chol check.  Patient was given opportunity to ask questions. Patient verbalized understanding of the plan and was able  to repeat key elements of the plan. All questions were answered to their satisfaction.    I, Gwynneth Aliment, MD, have reviewed all documentation for this visit. The documentation on 10/21/22 for the exam, diagnosis, procedures, and orders are all accurate and complete.   IF YOU HAVE BEEN REFERRED TO A SPECIALIST, IT MAY TAKE 1-2 WEEKS TO SCHEDULE/PROCESS THE REFERRAL. IF YOU HAVE NOT HEARD FROM US/SPECIALIST IN TWO WEEKS, PLEASE GIVE [place] A CALL AT [phone].   THE PATIENT IS ENCOURAGED TO PRACTICE SOCIAL DISTANCING DUE TO THE COVID-19 PANDEMIC.

## 2022-10-21 ENCOUNTER — Ambulatory Visit (INDEPENDENT_AMBULATORY_CARE_PROVIDER_SITE_OTHER): Payer: Medicare Other

## 2022-10-21 ENCOUNTER — Ambulatory Visit (INDEPENDENT_AMBULATORY_CARE_PROVIDER_SITE_OTHER): Payer: Medicare Other | Admitting: Internal Medicine

## 2022-10-21 ENCOUNTER — Ambulatory Visit
Admission: RE | Admit: 2022-10-21 | Discharge: 2022-10-21 | Disposition: A | Discharge status: Home / Self Care | Payer: Medicare Other | Source: Ambulatory Visit | Attending: Internal Medicine | Admitting: Internal Medicine

## 2022-10-21 ENCOUNTER — Encounter: Payer: Self-pay | Admitting: Internal Medicine

## 2022-10-21 VITALS — BP 122/70 | Temp 97.6°F | Ht 68.0 in | Wt 189.0 lb

## 2022-10-21 VITALS — BP 122/70 | HR 64 | Temp 97.6°F | Ht 68.8 in | Wt 189.8 lb

## 2022-10-21 DIAGNOSIS — R351 Nocturia: Secondary | ICD-10-CM

## 2022-10-21 DIAGNOSIS — R7309 Other abnormal glucose: Secondary | ICD-10-CM

## 2022-10-21 DIAGNOSIS — D696 Thrombocytopenia, unspecified: Secondary | ICD-10-CM

## 2022-10-21 DIAGNOSIS — I25118 Atherosclerotic heart disease of native coronary artery with other forms of angina pectoris: Secondary | ICD-10-CM

## 2022-10-21 DIAGNOSIS — R1310 Dysphagia, unspecified: Secondary | ICD-10-CM | POA: Diagnosis not present

## 2022-10-21 DIAGNOSIS — R058 Other specified cough: Secondary | ICD-10-CM | POA: Diagnosis not present

## 2022-10-21 DIAGNOSIS — I119 Hypertensive heart disease without heart failure: Secondary | ICD-10-CM | POA: Diagnosis not present

## 2022-10-21 DIAGNOSIS — E7489 Other specified disorders of carbohydrate metabolism: Secondary | ICD-10-CM | POA: Diagnosis not present

## 2022-10-21 DIAGNOSIS — Z0000 Encounter for general adult medical examination without abnormal findings: Secondary | ICD-10-CM | POA: Diagnosis not present

## 2022-10-21 DIAGNOSIS — R053 Chronic cough: Secondary | ICD-10-CM | POA: Diagnosis not present

## 2022-10-21 LAB — CBC
Hematocrit: 39.8 % (ref 37.5–51.0)
Hemoglobin: 13.7 g/dL (ref 13.0–17.7)
MCH: 30.5 pg (ref 26.6–33.0)
MCHC: 34.4 g/dL (ref 31.5–35.7)
MCV: 89 fL (ref 79–97)
Platelets: 141 10*3/uL — ABNORMAL LOW (ref 150–450)
RBC: 4.49 x10E6/uL (ref 4.14–5.80)
RDW: 12.6 % (ref 11.6–15.4)
WBC: 6.7 10*3/uL (ref 3.4–10.8)

## 2022-10-21 LAB — HEMOGLOBIN A1C
Est. average glucose Bld gHb Est-mCnc: 114 mg/dL
Hgb A1c MFr Bld: 5.6 % (ref 4.8–5.6)

## 2022-10-21 LAB — LIPID PANEL
Chol/HDL Ratio: 2.7 ratio (ref 0.0–5.0)
Cholesterol, Total: 104 mg/dL (ref 100–199)
HDL: 38 mg/dL — ABNORMAL LOW (ref >39)
LDL Chol Calc (NIH): 55 mg/dL (ref 0–99)
Triglycerides: 45 mg/dL (ref 0–149)
VLDL Cholesterol Cal: 11 mg/dL (ref 5–40)

## 2022-10-21 LAB — CMP14+EGFR
ALT: 11 IU/L (ref 0–44)
AST: 15 IU/L (ref 0–40)
Albumin: 3.7 g/dL (ref 3.7–4.7)
Alkaline Phosphatase: 66 IU/L (ref 44–121)
BUN/Creatinine Ratio: 15 (ref 10–24)
BUN: 18 mg/dL (ref 8–27)
Bilirubin Total: 0.6 mg/dL (ref 0.0–1.2)
CO2: 27 mmol/L (ref 20–29)
Calcium: 8.7 mg/dL (ref 8.6–10.2)
Chloride: 104 mmol/L (ref 96–106)
Creatinine, Ser: 1.2 mg/dL (ref 0.76–1.27)
Globulin, Total: 2.6 g/dL (ref 1.5–4.5)
Glucose: 81 mg/dL (ref 70–99)
Potassium: 4.2 mmol/L (ref 3.5–5.2)
Sodium: 142 mmol/L (ref 134–144)
Total Protein: 6.3 g/dL (ref 6.0–8.5)
eGFR: 60 mL/min/{1.73_m2} (ref >59)

## 2022-10-21 NOTE — Patient Instructions (Signed)
Eugene Murray , Thank you for taking time to come for your Medicare Wellness Visit. I appreciate your ongoing commitment to your health goals. Please review the following plan we discussed and let me know if I can assist you in the future.   Referrals/Orders/Follow-Ups/Clinician Recommendations: none  This is a list of the screening recommended for you and due dates:  Health Maintenance  Topic Date Due   COVID-19 Vaccine (4 - 2023-24 season) 10/30/2021   Flu Shot  09/30/2022   Medicare Annual Wellness Visit  10/21/2023   Pneumonia Vaccine  Completed   Zoster (Shingles) Vaccine  Completed   HPV Vaccine  Aged Out   DTaP/Tdap/Td vaccine  Discontinued   Hepatitis C Screening  Discontinued    Advanced directives: (Copy Requested) Please bring a copy of your health care power of attorney and living will to the office to be added to your chart at your convenience.  Next Medicare Annual Wellness Visit scheduled for next year: No, office will schedule appointment  insert Preventive Care attachment Insert FALL PREVENTION attachment if needed

## 2022-10-21 NOTE — Progress Notes (Signed)
Subjective:   Eugene Murray is a 82 y.o. male who presents for Medicare Annual/Subsequent preventive examination.  Visit Complete: In person    Review of Systems     Cardiac Risk Factors include: advanced age (>72men, >66 women)     Objective:    Today's Vitals   10/21/22 1027  BP: 122/70  Pulse: 64  Temp: 97.6 F (36.4 C)  TempSrc: Oral  SpO2: 97%  Weight: 189 lb 12.8 oz (86.1 kg)  Height: 5' 8.8" (1.748 m)   Body mass index is 28.19 kg/m.     10/21/2022   10:38 AM 10/14/2021    9:54 AM 09/11/2020   11:27 AM 08/30/2019   10:34 AM 08/29/2018   11:09 AM 03/19/2015    8:35 AM 08/21/2014    8:26 AM  Advanced Directives  Does Patient Have a Medical Advance Directive? Yes No Yes No No No No  Type of Estate agent of [place];Living will  Healthcare Power of [place];Living will      Copy of Healthcare Power of Attorney in Chart? No - copy requested  No - copy requested      Would patient like information on creating a medical advance directive?  No - Patient declined  No - Patient declined No - Patient declined No - patient declined information Yes - Educational materials given    Current Medications (verified) Outpatient Encounter Medications as of 10/21/2022  Medication Sig   amLODipine (NORVASC) 10 MG tablet TAKE 1 TABLET(10 MG) BY MOUTH DAILY   atorvastatin (LIPITOR) 40 MG tablet TAKE 1 TABLET(40 MG) BY MOUTH DAILY   Cholecalciferol (VITAMIN D) 2000 units CAPS Take 2,000 Units by mouth daily.   clopidogrel (PLAVIX) 75 MG tablet TAKE 1 TABLET BY MOUTH EVERY DAY   EPINEPHrine (EPIPEN 2-PAK) 0.3 mg/0.3 mL IJ SOAJ injection Inject 0.3 mg into the muscle as needed for anaphylaxis.   hydrochlorothiazide (MICROZIDE) 12.5 MG capsule TAKE 1 CAPSULE(12.5 MG) BY MOUTH DAILY   isosorbide mononitrate (IMDUR) 30 MG 24 hr tablet Take 1 tablet (30 mg total) by mouth daily.   lisinopril (ZESTRIL) 40 MG tablet Take 1 tablet (40 mg total) by mouth daily.    magnesium oxide (MAG-OX) 400 MG tablet Take 400 mg by mouth daily.   metoprolol tartrate (LOPRESSOR) 50 MG tablet Take 1 tablet (50 mg total) by mouth 2 (two) times daily. PT. NEEDS TO MAKE AN APPOINTMENT IN ORDER TO RECEIVE FUTURE REFILLS. FIRST ATTEMPT.   nitroGLYCERIN (NITROSTAT) 0.4 MG SL tablet Place 1 tablet (0.4 mg total) under the tongue every 5 (five) minutes as needed for chest pain.   No facility-administered encounter medications on file as of 10/21/2022.    Allergies (verified) Other, Penicillins, and Sulfa antibiotics   History: Past Medical History:  Diagnosis Date   Allergic rhinitis    Allergy    Cancer (HCC)    Prostate cancer   Cataract    Coronary artery disease    History of prostate cancer    Hyperlipidemia    Hypertension    Hypokalemia    Nephritis    as a child   OSA (obstructive sleep apnea)    cpap machine at home   Sleep apnea    Stroke [facility])    "mini stroke" TIA   Past Surgical History:  Procedure Laterality Date   CARDIAC CATHETERIZATION     CAROTID STENT  11-08 and 4-11   stent x 3 (12/2006 x 2; 2011 x 1 stent)  CATARACT EXTRACTION W/PHACO Left 08/21/2014   Procedure: PHACO EMULSION CATARACT EXTRACTION AND INTRAOCULAR LENS PLACEMENT (IOC) IMPLANT LEFT EYE;  Surgeon: Chalmers Guest, MD;  Location: [facility] OR;  Service: Ophthalmology;  Laterality: Left;   COLONOSCOPY  03/01/2013   COLONOSCOPY W/ POLYPECTOMY     CORONARY ANGIOPLASTY     EYE SURGERY     HERNIA REPAIR Bilateral    x 2   different times   PROSTATE SURGERY     had cancer; radical resection 1997    Family History  Problem Relation Age of Onset   Colon cancer Father        possibly age 40 y.o diagnosed    Cancer Father    Stroke Mother    Asthma Sister    Social History   Socioeconomic History   Marital status: Married    Spouse name: Amalia Greenhouse   Number of children: 3   Years of education: [employer] education level: Some college, no degree  Occupational History    Occupation: retired  Tobacco Use   Smoking status: Former    Current packs/day: 0.00    Types: Cigarettes    Start date: 09/09/1970    Quit date: 09/08/1985    Years since quitting: 37.1   Smokeless tobacco: Never  Vaping Use   Vaping status: Never Used  Substance and Sexual Activity   Alcohol use: Not Currently    Comment: seldom   Drug use: No   Sexual activity: Not Currently  Other Topics Concern   Not on file  Social History Narrative   3 kids    Retired Technical sales engineer    Former smoker quit 1980s. Denies etOH, other drugs    Patient lives at home with family. Patient lives at home with his wife Amalia Greenhouse)   Caffeine Use: 1 glass of tea every other day   Social Determinants of Health   Financial Resource Strain: Low Risk  (10/21/2022)   Overall Financial Resource Strain (CARDIA)    Difficulty of Paying Living Expenses: Not hard at all  Food Insecurity: No Food Insecurity (10/21/2022)   Hunger Vital Sign    Worried About Running Out of Food in the Last Year: Never true    Ran Out of Food in the Last Year: Never true  Transportation Needs: No Transportation Needs (10/21/2022)   PRAPARE - Administrator, Civil Service (Medical): No    Lack of Transportation (Non-Medical): No  Physical Activity: Inactive (10/21/2022)   Exercise Vital Sign    Days of Exercise per Week: 0 days    Minutes of Exercise per Session: 0 min  Stress: No Stress Concern Present (10/21/2022)   [employer] of Occupational Health - Occupational Stress Questionnaire    Feeling of Stress : Not at all  Social Connections: Socially Isolated (10/21/2022)   Social Connection and Isolation Panel [NHANES]    Frequency of Communication with Friends and Family: Once a week    Frequency of Social Gatherings with Friends and Family: Never    Attends Religious Services: Never    Database administrator or Organizations: No    Attends Engineer, structural: Never    Marital Status: Married     Tobacco Counseling Counseling given: Not Answered   Clinical Intake:  Pre-visit preparation completed: Yes  Pain : No/denies pain     Nutritional Status: BMI 25 -29 Overweight Nutritional Risks: None Diabetes: No  How often do you need to have someone help you when  you read instructions, pamphlets, or other written materials from your doctor or pharmacy?: 1 - Never  Interpreter Needed?: No  Information entered by :: NAllen LPN   Activities of Daily Living    10/21/2022   10:29 AM  In your present state of health, do you have any difficulty performing the following activities:  Hearing? 0  Vision? 1  Comment cataract in the right eye  Difficulty concentrating or making decisions? 0  Walking or climbing stairs? 0  Dressing or bathing? 0  Doing errands, shopping? 0  Preparing Food and eating ? N  Using the Toilet? N  In the past six months, have you accidently leaked urine? N  Do you have problems with loss of bowel control? Y  Comment sometimes  Managing your Medications? N  Managing your Finances? N  Housekeeping or managing your Housekeeping? N    Patient Care Team: Dorothyann Peng, MD as PCP - General (Internal Medicine) Lennette Bihari, MD as PCP - Cardiology (Cardiology) Clarene Duke Karma Lew, RN as [facility] Care Management Chalmers Guest, MD as Consulting Physician (Ophthalmology)  Indicate any recent Medical Services you may have received from other than [facility] providers in the past year (date may be approximate).     Assessment:   This is a routine wellness examination for [place].  Hearing/Vision screen Hearing Screening - Comments:: Denies hearing issues Vision Screening - Comments:: Regular eye exams, Dr. Harlon Flor  Dietary issues and exercise activities discussed:     Goals Addressed             This Visit's Progress    Patient Stated       10/21/2022, maintain current health       Depression Screen    10/21/2022    10:41 AM 07/08/2022    2:26 PM 04/09/2022    1:36 PM 10/14/2021    9:55 AM 10/14/2021    9:38 AM 09/11/2020   11:29 AM 09/11/2020   10:38 AM  PHQ 2/9 Scores  PHQ - 2 Score 0 0 0 0 0 0 0  PHQ- 9 Score  0         Fall Risk    10/21/2022   10:40 AM 07/08/2022    2:25 PM 04/09/2022    1:36 PM 10/14/2021    9:55 AM 10/14/2021    9:38 AM  Fall Risk   Falls in the past year? 0 0 0 0 0  Number falls in past yr: 0 0 0 0 0  Injury with Fall? 0 0 0 0 0  Risk for fall due to : Medication side effect No Fall Risks No Fall Risks Medication side effect No Fall Risks  Follow up Falls prevention discussed;Falls evaluation completed Falls evaluation completed Falls evaluation completed Falls evaluation completed;Education provided;Falls prevention discussed Falls evaluation completed    MEDICARE RISK AT HOME: Medicare Risk at Home Any stairs in or around the home?: Yes If so, are there any without handrails?: No Home free of loose throw rugs in walkways, pet beds, electrical cords, etc?: Yes Adequate lighting in your home to reduce risk of falls?: Yes Life alert?: No Use of a cane, walker or w/c?: No Grab bars in the bathroom?: Yes Shower chair or bench in shower?: No Elevated toilet seat or a handicapped toilet?: Yes  TIMED UP AND GO:  Was the test performed?  Yes  Length of time to ambulate 10 feet: 5 sec Gait steady and fast without use of assistive device  Cognitive Function:        10/21/2022   10:41 AM 10/14/2021    9:58 AM 09/11/2020   11:32 AM 08/30/2019   10:38 AM 08/29/2018   11:15 AM  6CIT Screen  What Year? 0 points 0 points 0 points 0 points 0 points  What month? 0 points 0 points 0 points 0 points 0 points  What time? 3 points 0 points 0 points 0 points 0 points  Count back from 20 0 points 0 points 0 points 0 points 0 points  Months in reverse 0 points 0 points 0 points 0 points 0 points  Repeat phrase 2 points 0 points 4 points 0 points 0 points  Total Score 5 points 0 points  4 points 0 points 0 points    Immunizations Immunization History  Administered Date(s) Administered   Fluad Quad(high Dose 65+) 03/05/2020   Influenza, High Dose Seasonal PF 02/27/2019   Influenza,inj,Quad PF,6+ Mos 12/05/2012, 12/06/2013, 02/10/2015, 02/10/2016   PFIZER(Purple Top)SARS-COV-2 Vaccination 04/06/2019, 04/27/2019, 12/26/2019   PNEUMOCOCCAL CONJUGATE-20 11/10/2021   Pneumococcal Polysaccharide-23 02/20/2019   Zoster Recombinant(Shingrix) 10/14/2021, 01/19/2022   Zoster, Live 01/10/2013    TDAP status: Up to date  Flu Vaccine status: Due, Education has been provided regarding the importance of this vaccine. Advised may receive this vaccine at local pharmacy or Health Dept. Aware to provide a copy of the vaccination record if obtained from local pharmacy or Health Dept. Verbalized acceptance and understanding.  Pneumococcal vaccine status: Up to date  Covid-19 vaccine status: Information provided on how to obtain vaccines.   Qualifies for Shingles Vaccine? Yes   Zostavax completed Yes   Shingrix Completed?: Yes  Screening Tests Health Maintenance  Topic Date Due   COVID-19 Vaccine (4 - 2023-24 season) 10/30/2021   INFLUENZA VACCINE  09/30/2022   Medicare Annual Wellness (AWV)  10/21/2023   Pneumonia Vaccine 22+ Years old  Completed   Zoster Vaccines- Shingrix  Completed   HPV VACCINES  Aged Out   DTaP/Tdap/Td  Discontinued   Hepatitis C Screening  Discontinued    Health Maintenance  Health Maintenance Due  Topic Date Due   COVID-19 Vaccine (4 - 2023-24 season) 10/30/2021   INFLUENZA VACCINE  09/30/2022    Colorectal cancer screening: No longer required.   Lung Cancer Screening: (Low Dose CT Chest recommended if Age 71-80 years, 20 pack-year currently smoking OR have quit w/in 15years.) does not qualify.   Lung Cancer Screening Referral: no  Additional Screening:  Hepatitis C Screening: does not qualify;  Vision Screening: Recommended annual  ophthalmology exams for early detection of glaucoma and other disorders of the eye. Is the patient up to date with their annual eye exam?  Yes  Who is the provider or what is the name of the office in which the patient attends annual eye exams? Dr. Harlon Flor If pt is not established with a provider, would they like to be referred to a provider to establish care? No .   Dental Screening: Recommended annual dental exams for proper oral hygiene  Diabetic Foot Exam: n/a  Community Resource Referral / Chronic Care Management: CRR required this visit?  No   CCM required this visit?  No     Plan:     I have personally reviewed and noted the following in the patient's chart:   Medical and social history Use of alcohol, tobacco or illicit drugs  Current medications and supplements including opioid prescriptions. Patient is not currently taking opioid prescriptions. Functional ability  and status Nutritional status Physical activity Advanced directives List of other physicians Hospitalizations, surgeries, and ER visits in previous 12 months Vitals Screenings to include cognitive, depression, and falls Referrals and appointments  In addition, I have reviewed and discussed with patient certain preventive protocols, quality metrics, and best practice recommendations. A written personalized care plan for preventive services as well as general preventive health recommendations were provided to patient.     Barb Merino, LPN   1/61/0960   After Visit Summary: in person  Nurse Notes: none

## 2022-10-25 ENCOUNTER — Ambulatory Visit: Payer: Self-pay

## 2022-10-26 ENCOUNTER — Other Ambulatory Visit: Payer: Self-pay | Admitting: Cardiovascular Disease

## 2022-10-26 NOTE — Patient Instructions (Signed)
Visit Information  Thank you for taking time to visit with me today. Please don't hesitate to contact me if I can be of assistance to you.   Following are the goals we discussed today:   Goals Addressed             This Visit's Progress    To improve HDL       Care Coordination Interventions: Provider established cholesterol goals reviewed Provided HLD educational materials Reviewed exercise goals and target of 150 minutes per week     COMPLETED: To monitor BP at home on a regular basis       Care Coordination Interventions: Evaluation of current treatment plan related to hypertension self management and patient's adherence to plan as established by provider Reviewed medications with patient and discussed importance of compliance Counseled on the importance of exercise goals with target of 150 minutes per week Advised patient, providing education and rationale, to monitor blood pressure daily and record, calling PCP for findings outside established parameters Provided education on prescribed diet low Sodium  Last practice recorded BP readings:  BP Readings from Last 3 Encounters:  10/21/22 122/70  10/21/22 122/70  07/08/22 104/78   Most recent eGFR/CrCl:  Lab Results  Component Value Date   EGFR 60 10/21/2022    No components found for: "CRCL"         Our next appointment is by telephone on 12/27/22 at 12:00 PM  Please call the care guide team at [phone] if you need to cancel or reschedule your appointment.   If you are experiencing a Mental Health or Behavioral Health Crisis or need someone to talk to, please call 1-800-273-TALK (toll free, 24 hour hotline)  Patient verbalizes understanding of instructions and care plan provided today and agrees to view in MyChart. Active MyChart status and patient understanding of how to access instructions and care plan via MyChart confirmed with patient.     Delsa Sale, RN, BSN, CCM Care Management Coordinator [facility] Care  Management  Direct Phone: [phone]

## 2022-10-26 NOTE — Patient Outreach (Signed)
  Care Coordination   Follow Up Visit Note   10/26/2022 Name: Eugene Murray MRN: [id number] DOB: 1940/05/30  Eugene Murray is a 82 y.o. year old male who sees Dorothyann Peng, MD for primary care. I spoke with  [name] by phone today.  What matters to the patients health and wellness today?  Patient would like to work on increasing his HDL.     Goals Addressed             This Visit's Progress    To improve HDL       Care Coordination Interventions: Provider established cholesterol goals reviewed Provided HLD educational materials Reviewed exercise goals and target of 150 minutes per week     COMPLETED: To monitor BP at home on a regular basis       Care Coordination Interventions: Evaluation of current treatment plan related to hypertension self management and patient's adherence to plan as established by provider Reviewed medications with patient and discussed importance of compliance Counseled on the importance of exercise goals with target of 150 minutes per week Advised patient, providing education and rationale, to monitor blood pressure daily and record, calling PCP for findings outside established parameters Provided education on prescribed diet low Sodium  Last practice recorded BP readings:  BP Readings from Last 3 Encounters:  10/21/22 122/70  10/21/22 122/70  07/08/22 104/78   Most recent eGFR/CrCl:  Lab Results  Component Value Date   EGFR 60 10/21/2022    No components found for: "CRCL"    Interventions Today    Flowsheet Row Most Recent Value  Chronic Disease   Chronic disease during today's visit Diabetes, Hypertension (HTN), Other  [atherosclerosis]  General Interventions   General Interventions Discussed/Reviewed General Interventions Discussed, General Interventions Reviewed, Doctor Visits, Lipid Profile, Labs  Labs Kidney Function  Doctor Visits Discussed/Reviewed Doctor Visits Discussed, Doctor Visits Reviewed, PCP  Education  Interventions   Education Provided Provided Education, Provided Printed Education  Provided Verbal Education On Nutrition, Labs, When to see the doctor  Labs Reviewed Hgb A1c, Kidney Function, Lipid Profile  Nutrition Interventions   Nutrition Discussed/Reviewed Nutrition Discussed, Nutrition Reviewed, Fluid intake  [increaing healthy fats]          SDOH assessments and interventions completed:  No     Care Coordination Interventions:  Yes, provided   Follow up plan: Follow up call scheduled for 12/27/22 @12 :00 PM    Encounter Outcome:  Pt. Visit Completed

## 2022-11-01 DIAGNOSIS — D696 Thrombocytopenia, unspecified: Secondary | ICD-10-CM | POA: Insufficient documentation

## 2022-11-01 DIAGNOSIS — I119 Hypertensive heart disease without heart failure: Secondary | ICD-10-CM | POA: Insufficient documentation

## 2022-11-01 DIAGNOSIS — R1310 Dysphagia, unspecified: Secondary | ICD-10-CM | POA: Insufficient documentation

## 2022-11-01 DIAGNOSIS — R351 Nocturia: Secondary | ICD-10-CM | POA: Insufficient documentation

## 2022-11-01 DIAGNOSIS — R053 Chronic cough: Secondary | ICD-10-CM | POA: Insufficient documentation

## 2022-11-01 NOTE — Assessment & Plan Note (Signed)
I will check CXR, he has h/o tobacco use. PND is also a possibility. denies increased belching and heartburn, but, GERD is also a possibility.

## 2022-11-01 NOTE — Assessment & Plan Note (Signed)
Chronic, well controlled. He will continue with amlodipine 10mg , hydrochlorothiazide 12.5mg  daily, lisinopril 40mg  daily and metoprolol 50mg  twice daily. He is encouraged to follow heart healthy, low sodium diet.

## 2022-11-01 NOTE — Assessment & Plan Note (Signed)
Chronic, he is s/p PCI in 2008 and 2011.  He is encouraged to follow a heart healthy lifestyle. He will continue with isosorbide mononitrate 30mg  daily, atorvastatin 40mg  daily and metoprolol tartrate 50mg  twice daily. He is also still on Plavix as per Cardiology.

## 2022-11-01 NOTE — Assessment & Plan Note (Signed)
I will check CBC to assess platelet count.

## 2022-11-16 ENCOUNTER — Other Ambulatory Visit: Payer: Self-pay | Admitting: Internal Medicine

## 2022-11-22 ENCOUNTER — Telehealth: Payer: Self-pay | Admitting: Cardiovascular Disease

## 2022-11-22 ENCOUNTER — Other Ambulatory Visit: Payer: Self-pay | Admitting: Cardiovascular Disease

## 2022-11-22 NOTE — Telephone Encounter (Signed)
*  STAT* If patient is at the pharmacy, call can be transferred to refill team.   1. Which medications need to be refilled? (please list name of each medication and dose if known) metoprolol tartrate (LOPRESSOR) 50 MG tablet nitroGLYCERIN (NITROSTAT) 0.4 MG SL tablet    2. Which pharmacy/location (including street and city if local pharmacy) is medication to be sent to? [id number] - [place], [place] - [address] AT [address]  3. Do they need a 30 day or 90 day supply?   90 day supply

## 2022-11-27 ENCOUNTER — Other Ambulatory Visit: Payer: Self-pay | Admitting: Cardiovascular Disease

## 2022-11-30 MED ORDER — METOPROLOL TARTRATE 50 MG PO TABS: 50.0000 mg | ORAL_TABLET | Freq: Two times a day (BID) | ORAL | 2 refills | Status: DC

## 2022-11-30 MED ORDER — NITROGLYCERIN 0.4 MG SL SUBL: 0.4000 mg | SUBLINGUAL_TABLET | SUBLINGUAL | 2 refills | Status: DC | PRN

## 2022-11-30 NOTE — Telephone Encounter (Signed)
Patient is following up due to medication not being sent to the pharmacy. He is not due for a follow up appointment until 05/2023. He would like a call back to confirm when his prescription is sent in.

## 2022-11-30 NOTE — Addendum Note (Signed)
Addended by: Candie Chroman on: 11/30/2022 12:05 PM   Modules accepted: Orders

## 2022-11-30 NOTE — Telephone Encounter (Signed)
Patient has had a visit on 05/2022, However it is noted as Clinical support rather than OV.  Re sent his metoprolol for 90 days with refill.  Also sent refill of nitroglycerin.  Advised to dispose of old medication as it was from 2022 and will not be as beneficial if at all.  He states understanding  Supervisor noted that OV was incorrectly labeled and will place ticket to have changed.

## 2022-12-12 ENCOUNTER — Other Ambulatory Visit: Payer: Self-pay | Admitting: Internal Medicine

## 2022-12-27 ENCOUNTER — Ambulatory Visit: Payer: Self-pay

## 2022-12-27 NOTE — Patient Outreach (Signed)
  Care Coordination   12/27/2022 Name: Eugene Murray MRN: [id number] DOB: 22-Nov-1940   Care Coordination Outreach Attempts:  An unsuccessful telephone outreach was attempted for a scheduled appointment today.  Follow Up Plan:  Additional outreach attempts will be made to offer the patient care coordination information and services.   Encounter Outcome:  No Answer   Care Coordination Interventions:  No, not indicated    Delsa Sale RN BSN CCM [place]  [facility], [facility] Nurse Care Coordinator  Direct Dial: [phone] Website: [email or web address]

## 2023-01-08 ENCOUNTER — Other Ambulatory Visit: Payer: Self-pay | Admitting: Internal Medicine

## 2023-01-11 ENCOUNTER — Other Ambulatory Visit: Payer: Self-pay

## 2023-01-11 MED ORDER — LISINOPRIL 40 MG PO TABS: 40.0000 mg | ORAL_TABLET | Freq: Every day | ORAL | 3 refills | Status: DC

## 2023-01-28 ENCOUNTER — Other Ambulatory Visit: Payer: Self-pay | Admitting: Internal Medicine

## 2023-01-28 ENCOUNTER — Other Ambulatory Visit: Payer: Self-pay | Admitting: Cardiovascular Disease

## 2023-01-31 ENCOUNTER — Telehealth: Payer: Self-pay | Admitting: *Deleted

## 2023-01-31 NOTE — Progress Notes (Unsigned)
  Care Coordination Note  01/31/2023 Name: Eugene Murray MRN: [id number] DOB: 01/21/1941  Eugene Murray is a 82 y.o. year old male who is a primary care patient of Dorothyann Peng, MD and is actively engaged with the care management team. I reached out to Eugene Murray by phone today to assist with re-scheduling a follow up visit with the RN Case Manager  Follow up plan: Unsuccessful telephone outreach attempt made. A HIPAA compliant phone message was left for the patient providing contact information and requesting a return call.   [facility]  Care Coordination Care Guide  Direct Dial: [phone]

## 2023-02-01 NOTE — Progress Notes (Signed)
  Care Coordination Note  02/01/2023 Name: [name] MRN: [id number] DOB: 1941-01-19  Eugene Murray is a 82 y.o. year old male who is a primary care patient of Dorothyann Peng, MD and is actively engaged with the care management team. I reached out to Eugene Murray by phone today to assist with re-scheduling a follow up visit with the RN Case Manager  Follow up plan: Telephone appointment with care management team member scheduled for:02/09/23  [facility] Coordination Care Guide  Direct Dial: [phone]

## 2023-02-09 ENCOUNTER — Ambulatory Visit: Payer: Self-pay

## 2023-02-09 NOTE — Patient Outreach (Signed)
  Care Coordination   02/09/2023 Name: Eugene Murray MRN: [id number] DOB: [birth year]   Care Coordination Outreach Attempts:  An unsuccessful telephone outreach was attempted for a scheduled appointment today.  Follow Up Plan:  Additional outreach attempts will be made to offer the patient care coordination information and services.   Encounter Outcome:  No Answer   Care Coordination Interventions:  No, not indicated    Delsa Sale RN BSN CCM [place]  [facility], [facility] Nurse Care Coordinator  Direct Dial: [phone] Website: [email or web address]

## 2023-04-26 ENCOUNTER — Ambulatory Visit: Payer: Medicare Other | Admitting: Internal Medicine

## 2023-04-26 ENCOUNTER — Encounter: Payer: Self-pay | Admitting: Internal Medicine

## 2023-04-26 VITALS — BP 134/70 | HR 53 | Temp 98.7°F | Ht 68.0 in | Wt 196.6 lb

## 2023-04-26 DIAGNOSIS — I25118 Atherosclerotic heart disease of native coronary artery with other forms of angina pectoris: Secondary | ICD-10-CM

## 2023-04-26 DIAGNOSIS — G4733 Obstructive sleep apnea (adult) (pediatric): Secondary | ICD-10-CM

## 2023-04-26 DIAGNOSIS — I119 Hypertensive heart disease without heart failure: Secondary | ICD-10-CM

## 2023-04-26 DIAGNOSIS — L918 Other hypertrophic disorders of the skin: Secondary | ICD-10-CM

## 2023-04-26 DIAGNOSIS — R7309 Other abnormal glucose: Secondary | ICD-10-CM

## 2023-04-26 DIAGNOSIS — E7800 Pure hypercholesterolemia, unspecified: Secondary | ICD-10-CM | POA: Diagnosis not present

## 2023-04-26 DIAGNOSIS — N5089 Other specified disorders of the male genital organs: Secondary | ICD-10-CM | POA: Diagnosis not present

## 2023-04-26 DIAGNOSIS — D696 Thrombocytopenia, unspecified: Secondary | ICD-10-CM

## 2023-04-26 LAB — CMP14+EGFR
ALT: 13 [IU]/L (ref 0–44)
AST: 16 [IU]/L (ref 0–40)
Albumin: 3.8 g/dL (ref 3.7–4.7)
Alkaline Phosphatase: 61 [IU]/L (ref 44–121)
BUN/Creatinine Ratio: 11 (ref 10–24)
BUN: 11 mg/dL (ref 8–27)
Bilirubin Total: 0.7 mg/dL (ref 0.0–1.2)
CO2: 28 mmol/L (ref 20–29)
Calcium: 8.5 mg/dL — ABNORMAL LOW (ref 8.6–10.2)
Chloride: 105 mmol/L (ref 96–106)
Creatinine, Ser: 0.99 mg/dL (ref 0.76–1.27)
Globulin, Total: 2.3 g/dL (ref 1.5–4.5)
Glucose: 91 mg/dL (ref 70–99)
Potassium: 4.1 mmol/L (ref 3.5–5.2)
Sodium: 144 mmol/L (ref 134–144)
Total Protein: 6.1 g/dL (ref 6.0–8.5)
eGFR: 76 mL/min/{1.73_m2} (ref >59)

## 2023-04-26 LAB — CBC
Hematocrit: 38.8 % (ref 37.5–51.0)
Hemoglobin: 13.4 g/dL (ref 13.0–17.7)
MCH: 30.9 pg (ref 26.6–33.0)
MCHC: 34.5 g/dL (ref 31.5–35.7)
MCV: 89 fL (ref 79–97)
Platelets: 114 10*3/uL — ABNORMAL LOW (ref 150–450)
RBC: 4.34 x10E6/uL (ref 4.14–5.80)
RDW: 12.5 % (ref 11.6–15.4)
WBC: 4.8 10*3/uL (ref 3.4–10.8)

## 2023-04-26 LAB — HEMOGLOBIN A1C
Est. average glucose Bld gHb Est-mCnc: 123 mg/dL
Hgb A1c MFr Bld: 5.9 % — ABNORMAL HIGH (ref 4.8–5.6)

## 2023-04-26 NOTE — Progress Notes (Signed)
 I,Victoria T Deloria Lair, CMA,acting as a Neurosurgeon for Gwynneth Aliment, MD.,have documented all relevant documentation on the behalf of Gwynneth Aliment, MD,as directed by  Gwynneth Aliment, MD while in the presence of Gwynneth Aliment, MD.  Subjective:  Patient ID: Eugene Murray , male    DOB: August 31, 1940 , 83 y.o.   MRN: [id number]  Chief Complaint  Patient presents with   Hypertension   Hyperlipidemia    HPI  He presents today for BP, cholesterol & abnormal glucose f/u. He reports compliance with meds. Denies headache, chest pain, and SOB. He feels well, no new complaints.    Hypertension This is a chronic problem. The current episode started more than 1 year ago. The problem has been gradually improving since onset. The problem is controlled. Risk factors for coronary artery disease include dyslipidemia and male gender. The current treatment provides moderate improvement. Hypertensive end-organ damage includes CAD/MI.  Hyperlipidemia This is a chronic problem. The current episode started more than 1 year ago. He has no history of diabetes. Current antihyperlipidemic treatment includes statins.     Past Medical History:  Diagnosis Date   Allergic rhinitis    Allergy    Cancer (HCC)    Prostate cancer   Cataract    Coronary artery disease    History of prostate cancer    Hyperlipidemia    Hypertension    Hypokalemia    Nephritis    as a child   OSA (obstructive sleep apnea)    cpap machine at home   Sleep apnea    Stroke [facility])    "mini stroke" TIA     Family History  Problem Relation Age of Onset   Colon cancer Father        possibly age 66 y.o diagnosed    Cancer Father    Stroke Mother    Asthma Sister      Current Outpatient Medications:    amLODipine (NORVASC) 10 MG tablet, TAKE 1 TABLET(10 MG) BY MOUTH DAILY, Disp: 90 tablet, Rfl: 1   atorvastatin (LIPITOR) 40 MG tablet, TAKE 1 TABLET(40 MG) BY MOUTH DAILY, Disp: 30 tablet, Rfl: 7   Cholecalciferol (VITAMIN D) 2000  units CAPS, Take 2,000 Units by mouth daily., Disp: , Rfl:    clopidogrel (PLAVIX) 75 MG tablet, TAKE 1 TABLET BY MOUTH EVERY DAY, Disp: 90 tablet, Rfl: 1   EPINEPHrine (EPIPEN 2-PAK) 0.3 mg/0.3 mL IJ SOAJ injection, Inject 0.3 mg into the muscle as needed for anaphylaxis., Disp: 2 each, Rfl: 1   hydrochlorothiazide (MICROZIDE) 12.5 MG capsule, TAKE 1 CAPSULE(12.5 MG) BY MOUTH DAILY, Disp: 90 capsule, Rfl: 2   isosorbide mononitrate (IMDUR) 30 MG 24 hr tablet, Take 1 tablet (30 mg total) by mouth daily., Disp: 90 tablet, Rfl: 3   lisinopril (ZESTRIL) 40 MG tablet, Take 1 tablet (40 mg total) by mouth daily., Disp: 90 tablet, Rfl: 3   magnesium oxide (MAG-OX) 400 MG tablet, Take 400 mg by mouth daily., Disp: , Rfl:    metoprolol tartrate (LOPRESSOR) 50 MG tablet, Take 1 tablet (50 mg total) by mouth 2 (two) times daily., Disp: 180 tablet, Rfl: 2   nitroGLYCERIN (NITROSTAT) 0.4 MG SL tablet, Place 1 tablet (0.4 mg total) under the tongue every 5 (five) minutes as needed for chest pain., Disp: 25 tablet, Rfl: 2   Allergies  Allergen Reactions   Other Hives    SEAFOOD   Penicillins Other (See Comments)    "caused me to pass  out"   Sulfa Antibiotics Hives     Review of Systems  Constitutional: Negative.   HENT: Negative.    Eyes: Negative.  Negative for redness.  Respiratory: Negative.    Cardiovascular: Negative.   Gastrointestinal: Negative.   Endocrine: Negative.   Genitourinary:  Positive for scrotal swelling.  Skin: Negative.        He c/o skin tags. Wants referral to Derm to have them removed. Had them removed by Plastic Surgeon in the past   Allergic/Immunologic: Negative.   Neurological: Negative.   Hematological: Negative.      Today's Vitals   04/26/23 1037  BP: 134/70  Pulse: (!) 53  Temp: 98.7 F (37.1 C)  SpO2: 98%  Weight: 196 lb 9.6 oz (89.2 kg)  Height: 5\' 8"  (1.727 m)   Body mass index is 29.89 kg/m.  Wt Readings from Last 3 Encounters:  04/26/23 196 lb 9.6  oz (89.2 kg)  10/21/22 189 lb (85.7 kg)  10/21/22 189 lb 12.8 oz (86.1 kg)    The ASCVD Risk score (Arnett DK, et al., 2019) failed to calculate for the following reasons:   The 2019 ASCVD risk score is only valid for ages 28 to 76   Risk score cannot be calculated because patient has a medical history suggesting prior/existing ASCVD  Objective:  Physical Exam Vitals and nursing note reviewed.  Constitutional:      Appearance: Normal appearance.  HENT:     Head: Normocephalic and atraumatic.  Eyes:     Extraocular Movements: Extraocular movements intact.  Cardiovascular:     Rate and Rhythm: Normal rate and regular rhythm.     Heart sounds: Normal heart sounds.  Pulmonary:     Effort: Pulmonary effort is normal.     Breath sounds: Normal breath sounds.  Genitourinary:    Comments: Deferred, per patient Skin:    General: Skin is warm.  Neurological:     General: No focal deficit present.     Mental Status: He is alert.  Psychiatric:        Mood and Affect: Mood normal.         Assessment And Plan:  Hypertensive heart disease without heart failure Assessment & Plan: Chronic, fair control. Goal BP<120/80.Marland Kitchen He will continue with amlodipine 10mg , hydrochlorothiazide 12.5mg  daily, lisinopril 40mg  daily and metoprolol 50mg  twice daily. He is encouraged to follow heart healthy, low sodium diet.   Orders: -     CMP14+EGFR  Atherosclerosis of native coronary artery of native heart with other form of angina pectoris [facility]) Assessment & Plan: Chronic, he is s/p PCI in 2008 and 2011.  He is encouraged to follow a heart healthy lifestyle. He will continue with isosorbide mononitrate 30mg  daily, atorvastatin 40mg  daily and metoprolol tartrate 50mg  twice daily. He is also still on Plavix as per Cardiology.    Pure hypercholesterolemia Assessment & Plan: Chronic, LDL goal is less than 70 due to underlying CAD.  He will continue with atorvastatin 40mg  daily.     Thrombocytopenia  (HCC) Assessment & Plan: I will check CBC to assess platelet count.  Orders: -     CBC  Scrotal swelling -     Ambulatory referral to Urology  Skin tag -     Ambulatory referral to Dermatology  OSA on CPAP Assessment & Plan: Chronic, he reports compliance with CPAP.  He is encouraged to wear CPAP at least four hours nightly.  He acknowledges benefit from compliance with CPAP.    Abnormal  glucose Assessment & Plan: Previous labs reviewed, his A1c has been elevated in the past. I will check an A1c today. Reminded to avoid refined sugars including sugary drinks/foods and processed meats including bacon, sausages and deli meats.    Orders: -     Hemoglobin A1c    Return please schedule bp check in August 2025.  Patient was given opportunity to ask questions. Patient verbalized understanding of the plan and was able to repeat key elements of the plan. All questions were answered to their satisfaction.    I, Gwynneth Aliment, MD, have reviewed all documentation for this visit. The documentation on 04/26/23 for the exam, diagnosis, procedures, and orders are all accurate and complete.   IF YOU HAVE BEEN REFERRED TO A SPECIALIST, IT MAY TAKE 1-2 WEEKS TO SCHEDULE/PROCESS THE REFERRAL. IF YOU HAVE NOT HEARD FROM US/SPECIALIST IN TWO WEEKS, PLEASE GIVE [place] A CALL AT [phone].

## 2023-04-26 NOTE — Patient Instructions (Signed)
 Hypertension, Adult Hypertension is another name for high blood pressure. High blood pressure forces your heart to work harder to pump blood. This can cause problems over time. There are two numbers in a blood pressure reading. There is a top number (systolic) over a bottom number (diastolic). It is best to have a blood pressure that is below 120/80. What are the causes? The cause of this condition is not known. Some other conditions can lead to high blood pressure. What increases the risk? Some lifestyle factors can make you more likely to develop high blood pressure: Smoking. Not getting enough exercise or physical activity. Being overweight. Having too much fat, sugar, calories, or salt (sodium) in your diet. Drinking too much alcohol. Other risk factors include: Having any of these conditions: Heart disease. Diabetes. High cholesterol. Kidney disease. Obstructive sleep apnea. Having a family history of high blood pressure and high cholesterol. Age. The risk increases with age. Stress. What are the signs or symptoms? High blood pressure may not cause symptoms. Very high blood pressure (hypertensive crisis) may cause: Headache. Fast or uneven heartbeats (palpitations). Shortness of breath. Nosebleed. Vomiting or feeling like you may vomit (nauseous). Changes in how you see. Very bad chest pain. Feeling dizzy. Seizures. How is this treated? This condition is treated by making healthy lifestyle changes, such as: Eating healthy foods. Exercising more. Drinking less alcohol. Your doctor may prescribe medicine if lifestyle changes do not help enough and if: Your top number is above 130. Your bottom number is above 80. Your personal target blood pressure may vary. Follow these instructions at home: Eating and drinking  If told, follow the DASH eating plan. To follow this plan: Fill one half of your plate at each meal with fruits and vegetables. Fill one fourth of your plate  at each meal with whole grains. Whole grains include whole-wheat pasta, brown rice, and whole-grain bread. Eat or drink low-fat dairy products, such as skim milk or low-fat yogurt. Fill one fourth of your plate at each meal with low-fat (lean) proteins. Low-fat proteins include fish, chicken without skin, eggs, beans, and tofu. Avoid fatty meat, cured and processed meat, or chicken with skin. Avoid pre-made or processed food. Limit the amount of salt in your diet to less than 1,500 mg each day. Do not drink alcohol if: Your doctor tells you not to drink. You are pregnant, may be pregnant, or are planning to become pregnant. If you drink alcohol: Limit how much you have to: 0-1 drink a day for women. 0-2 drinks a day for men. Know how much alcohol is in your drink. In the [place] one drink equals one 12 oz bottle of beer (355 mL), one 5 oz glass of wine (148 mL), or one 1 oz glass of hard liquor (44 mL). Lifestyle  Work with your doctor to stay at a healthy weight or to lose weight. Ask your doctor what the best weight is for you. Get at least 30 minutes of exercise that causes your heart to beat faster (aerobic exercise) most days of the week. This may include walking, swimming, or biking. Get at least 30 minutes of exercise that strengthens your muscles (resistance exercise) at least 3 days a week. This may include lifting weights or doing Pilates. Do not smoke or use any products that contain nicotine or tobacco. If you need help quitting, ask your doctor. Check your blood pressure at home as told by your doctor. Keep all follow-up visits. Medicines Take over-the-counter and prescription medicines  only as told by your doctor. Follow directions carefully. Do not skip doses of blood pressure medicine. The medicine does not work as well if you skip doses. Skipping doses also puts you at risk for problems. Ask your doctor about side effects or reactions to medicines that you should watch  for. Contact a doctor if: You think you are having a reaction to the medicine you are taking. You have headaches that keep coming back. You feel dizzy. You have swelling in your ankles. You have trouble with your vision. Get help right away if: You get a very bad headache. You start to feel mixed up (confused). You feel weak or numb. You feel faint. You have very bad pain in your: Chest. Belly (abdomen). You vomit more than once. You have trouble breathing. These symptoms may be an emergency. Get help right away. Call 911. Do not wait to see if the symptoms will go away. Do not drive yourself to the hospital. Summary Hypertension is another name for high blood pressure. High blood pressure forces your heart to work harder to pump blood. For most people, a normal blood pressure is less than 120/80. Making healthy choices can help lower blood pressure. If your blood pressure does not get lower with healthy choices, you may need to take medicine. This information is not intended to replace advice given to you by your health care provider. Make sure you discuss any questions you have with your health care provider. Document Revised: 12/04/2020 Document Reviewed: 12/04/2020 Elsevier Patient Education  2024 [employer].

## 2023-05-02 DIAGNOSIS — R7309 Other abnormal glucose: Secondary | ICD-10-CM | POA: Insufficient documentation

## 2023-05-02 NOTE — Assessment & Plan Note (Signed)
 Chronic, LDL goal is less than 70 due to underlying CAD.  He will continue with atorvastatin 40mg  daily.

## 2023-05-02 NOTE — Assessment & Plan Note (Signed)
Chronic, he is s/p PCI in 2008 and 2011.  He is encouraged to follow a heart healthy lifestyle. He will continue with isosorbide mononitrate 30mg  daily, atorvastatin 40mg  daily and metoprolol tartrate 50mg  twice daily. He is also still on Plavix as per Cardiology.

## 2023-05-02 NOTE — Assessment & Plan Note (Signed)
I will check CBC to assess platelet count.

## 2023-05-02 NOTE — Assessment & Plan Note (Signed)
 Chronic, fair control. Goal BP<120/80.Marland Kitchen He will continue with amlodipine 10mg , hydrochlorothiazide 12.5mg  daily, lisinopril 40mg  daily and metoprolol 50mg  twice daily. He is encouraged to follow heart healthy, low sodium diet.

## 2023-05-02 NOTE — Assessment & Plan Note (Signed)
 Chronic, he reports compliance with CPAP.  He is encouraged to wear CPAP at least four hours nightly.  He acknowledges benefit from compliance with CPAP.

## 2023-05-02 NOTE — Assessment & Plan Note (Signed)
 Previous labs reviewed, his A1c has been elevated in the past. I will check an A1c today. Reminded to avoid refined sugars including sugary drinks/foods and processed meats including bacon, sausages and deli meats.

## 2023-05-05 ENCOUNTER — Ambulatory Visit: Payer: Self-pay

## 2023-05-05 NOTE — Patient Instructions (Signed)
 Visit Information  Thank you for taking time to visit with me today. Please don't hesitate to contact me if I can be of assistance to you.   Following are the goals we discussed today:   Goals Addressed             This Visit's Progress    To establish with an Eye Specialist for cataract removal       Care Coordination Interventions: Evaluation of current treatment plan related to right eye Cataract Extraction  and patient's adherence to plan as established by provider Determined patient would like a referral for an Eye Specialist to consult for cataract removal Sent in basket message to Dr. Allyne Gee requesting a referral per patient request  Discussed plans with patient for ongoing care coordination follow up and provided patient with direct contact information for nurse care coordinator Scheduled nurse follow up call for 06/16/23 @1 :30 PM     To improve HDL       Care Coordination Interventions: Provider established cholesterol goals reviewed Provided HLD educational materials Reviewed exercise goals and target of 150 minutes per week Mailed printed educational materials related to improving HDL Lipid Panel     Component Value Date/Time   CHOL 104 10/21/2022 1115   TRIG 45 10/21/2022 1115   HDL 38 (L) 10/21/2022 1115   CHOLHDL 2.7 10/21/2022 1115   CHOLHDL 3.1 08/05/2015 1050   VLDL 14 08/05/2015 1050   LDLCALC 55 10/21/2022 1115   LABVLDL 11 10/21/2022 1115       To work on lowering A1c       Care Coordination Interventions: Provided education to patient about basic disease process for prediabetes Review of patient status, including review of consultants reports, relevant laboratory and other test results, and medications completed Counseled on Diabetic diet, my plate method, 161 minutes of moderate intensity exercise/week Mailed printed educational materials related to diabetes management  Lab Results  Component Value Date   HGBA1C 5.9 (H) 04/26/2023         Our  next appointment is by telephone on 06/16/23 at 1:30 PM  Please call the care guide team at [phone] if you need to cancel or reschedule your appointment.   If you are experiencing a Mental Health or Behavioral Health Crisis or need someone to talk to, please call 1-800-273-TALK (toll free, 24 hour hotline)  Patient verbalizes understanding of instructions and care plan provided today and agrees to view in MyChart. Active MyChart status and patient understanding of how to access instructions and care plan via MyChart confirmed with patient.     Delsa Sale RN BSN CCM [place]  [facility], [facility] Nurse Care Coordinator  Direct Dial: [phone] Website: [email or web address]

## 2023-05-05 NOTE — Patient Outreach (Signed)
 Care Coordination   Follow Up Visit Note   05/05/2023 Name: Eugene Murray MRN: [id number] DOB: 1940/07/30  Eugene Murray is a 83 y.o. year old male who sees Dorothyann Peng, MD for primary care. I spoke with  [name] by phone today.  What matters to the patients health and wellness today?  Patient would like to work on being more active and lower his A1c.     Goals Addressed             This Visit's Progress    To establish with an Eye Specialist for cataract removal       Care Coordination Interventions: Evaluation of current treatment plan related to right eye Cataract Extraction  and patient's adherence to plan as established by provider Determined patient would like a referral for an Eye Specialist to consult for cataract removal Sent in basket message to Dr. Allyne Gee requesting a referral per patient request  Discussed plans with patient for ongoing care coordination follow up and provided patient with direct contact information for nurse care coordinator Scheduled nurse follow up call for 06/16/23 @1 :30 PM        To improve HDL       Care Coordination Interventions: Provider established cholesterol goals reviewed Provided HLD educational materials Reviewed exercise goals and target of 150 minutes per week Mailed printed educational materials related to improving HDL Lipid Panel     Component Value Date/Time   CHOL 104 10/21/2022 1115   TRIG 45 10/21/2022 1115   HDL 38 (L) 10/21/2022 1115   CHOLHDL 2.7 10/21/2022 1115   CHOLHDL 3.1 08/05/2015 1050   VLDL 14 08/05/2015 1050   LDLCALC 55 10/21/2022 1115   LABVLDL 11 10/21/2022 1115           To work on lowering A1c       Care Coordination Interventions: Provided education to patient about basic disease process for prediabetes Review of patient status, including review of consultants reports, relevant laboratory and other test results, and medications completed Counseled on Diabetic diet, my plate method, 409  minutes of moderate intensity exercise/week Mailed printed educational materials related to diabetes management  Lab Results  Component Value Date   HGBA1C 5.9 (H) 04/26/2023          Interventions Today    Flowsheet Row Most Recent Value  Chronic Disease   Chronic disease during today's visit Diabetes, Other, Hypertension (HTN)  [low Calcium,  low platelets,  CAD]  General Interventions   General Interventions Discussed/Reviewed General Interventions Discussed, General Interventions Reviewed, Doctor Visits, Labs, Durable Medical Equipment (DME), Communication with  Doctor Visits Discussed/Reviewed Doctor Visits Reviewed, Doctor Visits Discussed, PCP, Specialist  Durable Medical Equipment (DME) BP Cuff  Communication with PCP/Specialists  [Dr. Sanders]  Exercise Interventions   Exercise Discussed/Reviewed Physical Activity, Exercise Reviewed, Exercise Discussed  Physical Activity Discussed/Reviewed Types of exercise, Physical Activity Reviewed, Physical Activity Discussed  Education Interventions   Education Provided Provided Printed Education, Provided Education  Provided Verbal Education On Nutrition, Medication, Exercise, Labs, Eye Care, When to see the doctor  Labs Reviewed Hgb A1c, Kidney Function, Lipid Profile  Nutrition Interventions   Nutrition Discussed/Reviewed Nutrition Discussed, Nutrition Reviewed, Carbohydrate meal planning, Portion sizes  Pharmacy Interventions   Pharmacy Dicussed/Reviewed Pharmacy Topics Discussed, Pharmacy Topics Reviewed, Medications and their functions          SDOH assessments and interventions completed:  Yes  SDOH Interventions Today    Flowsheet Row Most Recent Value  SDOH Interventions   Food Insecurity Interventions Intervention Not Indicated  Housing Interventions Intervention Not Indicated  Utilities Interventions Intervention Not Indicated  Physical Activity Interventions Patient Declined        Care Coordination  Interventions:  Yes, provided   Follow up plan: Follow up call scheduled for 06/16/23 @1 :30 PM    Encounter Outcome:  Patient Visit Completed

## 2023-05-27 DIAGNOSIS — R351 Nocturia: Secondary | ICD-10-CM | POA: Diagnosis not present

## 2023-05-27 DIAGNOSIS — N430 Encysted hydrocele: Secondary | ICD-10-CM | POA: Diagnosis not present

## 2023-05-27 DIAGNOSIS — R3915 Urgency of urination: Secondary | ICD-10-CM | POA: Diagnosis not present

## 2023-06-16 ENCOUNTER — Ambulatory Visit: Payer: Self-pay

## 2023-06-16 NOTE — Patient Outreach (Signed)
 Complex Care Management   Visit Note  06/16/2023  Name:  Eugene Murray MRN: [id number] DOB: 05-03-1940  Situation: Referral received for Complex Care Management related to  Hypertensive Heart disease  I obtained verbal consent from Patient.  Visit completed with patient  on the phone  Background:   Past Medical History:  Diagnosis Date   Allergic rhinitis    Allergy    Cancer (HCC)    Prostate cancer   Cataract    Coronary artery disease    History of prostate cancer    Hyperlipidemia    Hypertension    Hypokalemia    Nephritis    as a child   OSA (obstructive sleep apnea)    cpap machine at home   Sleep apnea    Stroke [facility])    "mini stroke" TIA    Assessment: Patient Reported Symptoms:  Cognitive Cognitive Status: Alert and oriented to person, place, and time Cognitive/Intellectual Conditions Management [RPT]: None reported or documented in medical history or problem list   Health Maintenance Behaviors: Annual physical exam, Exercise, Healthy diet, Stress management Healing Pattern: Average Health Facilitated by: Stress management, Rest, Healthy diet  Neurological  Neurological Symptoms Reported: No Symptoms reported    HEENT HEENT Symptoms Reported: No symptoms reported HEENT Conditions: Vision problem(s) Vision Problems: glaucoma HEENT Management Strategies: Routine screening HEENT Self-Management Outcome: 4 (good) Vision problem(s)  Cardiovascular   Weight: 185 lb (83.9 kg) (patient report checking on bathroom scale about a month ago)  Respiratory Respiratory Symptoms Reported: No symptoms reported    Endocrine Patient reports the following symptoms related to hypoglycemia or hyperglycemia : No symptoms reported Is patient diabetic?: Yes Is patient checking blood sugars at home?: No Endocrine Conditions: Diabetes Endocrine Management Strategies: Routine screening, Diet modification Endocrine Self-Management Outcome: 4 (good)  Gastrointestinal  Gastrointestinal Symptoms Reported: No symptoms reported   Nutrition Risk Screen (CP): No indicators present  Genitourinary  Genitourinary Symptoms Reported: No Symptoms reported    Integumentary Integumentary Symptoms Reported: No symptoms reported Skin Conditions: Other (skin tag) Other Skin Conditions: patient scheduled to f/u with dermatology Skin Management Strategies: Routine screening Skin Self-Management Outcome: 3 (uncertain)  Musculoskeletal Musculoskelatal Symptoms Reviewed: No symptoms reported Musculoskeletal Conditions: Back pain Musculoskeletal Management Strategies: Exercise Musculoskeletal Self-Management Outcome: 4 (good) Musculoskeletal Comment: uses railing Falls in the past year?: No Number of falls in past year: 1 or less Was there an injury with Fall?: No Fall Risk Category Calculator: 0 Patient Fall Risk Level: Low Fall Risk Patient at Risk for Falls Due to: No Fall Risks  Psychosocial Psychosocial Symptoms Reported: No symptoms reported Behavioral Management Strategies: Coping strategies, Support system Major Change/Loss/Stressor/Fears (CP): Denies Techniques to Cope with Loss/Stress/Change: Diversional activities Quality of Family Relationships: supportive Do you feel physically threatened by others?: No      06/16/2023    1:50 PM  Depression screen PHQ 2/9  Decreased Interest 0  Down, Depressed, Hopeless 0  PHQ - 2 Score 0    Vitals:   06/16/23 1333  BP: (!) 160/77  Pulse: (!) 47    Medications Reviewed Today     Reviewed by Riley Churches, RN (Registered Nurse) on 06/16/23 at 1344  Med List Status: <None>   Medication Order Taking? Sig Documenting Provider Last Dose Status Informant  acetaminophen (TYLENOL) 500 MG tablet [id number] Yes Take 500 mg by mouth every 6 (six) hours as needed for mild pain (pain score 1-3). [provider] Taking Active   amLODipine (NORVASC) 10  MG tablet [id number] Yes TAKE 1 TABLET(10 MG) BY MOUTH DAILY  Millicent Ally, MD Taking Active   atorvastatin (LIPITOR) 40 MG tablet [id number] Yes TAKE 1 TABLET(40 MG) BY MOUTH DAILY Millicent Ally, MD Taking Active   Cholecalciferol (VITAMIN D) 2000 units CAPS [id number] No Take 2,000 Units by mouth daily.  Patient not taking: Reported on 06/16/2023   [provider] Not Taking Consider Medication Status and Discontinue (Change in therapy)   clopidogrel (PLAVIX) 75 MG tablet [id number] Yes TAKE 1 TABLET BY MOUTH EVERY DAY Millicent Ally, MD Taking Active   EPINEPHrine (EPIPEN 2-PAK) 0.3 mg/0.3 mL IJ SOAJ injection [id number]  Inject 0.3 mg into the muscle as needed for anaphylaxis. Cleave Curling, MD  Active   hydrochlorothiazide (MICROZIDE) 12.5 MG capsule [id number] Yes TAKE 1 CAPSULE(12.5 MG) BY MOUTH DAILY Cleave Curling, MD Taking Active   isosorbide mononitrate (IMDUR) 30 MG 24 hr tablet [id number] Yes Take 1 tablet (30 mg total) by mouth daily. Millicent Ally, MD Taking Active   lisinopril (ZESTRIL) 40 MG tablet [id number] Yes Take 1 tablet (40 mg total) by mouth daily. Cleave Curling, MD Taking Active   magnesium oxide (MAG-OX) 400 MG tablet [id number] No Take 400 mg by mouth daily.  Patient not taking: Reported on 06/16/2023   [provider] Not Taking Consider Medication Status and Discontinue (Change in therapy)   metoprolol tartrate (LOPRESSOR) 50 MG tablet [id number] Yes Take 1 tablet (50 mg total) by mouth 2 (two) times daily. Millicent Ally, MD Taking Active   Multiple Vitamin (MULTIVITAMIN) tablet [id number] Yes Take 1 tablet by mouth daily. [provider] Taking Active   nitroGLYCERIN (NITROSTAT) 0.4 MG SL tablet [id number] Yes Place 1 tablet (0.4 mg total) under the tongue every 5 (five) minutes as needed for chest pain. Millicent Ally, MD Taking Active   Med List Note Linder Revere, Tereso Fenton, MD 09/12/10 2159): CPAP [place] Manna            Recommendation:   PCP Follow-up  Follow Up Plan:   Telephone follow up  appointment date/time:  07/14/23 @12 :00 PM  Louanne Roussel RN BSN CCM [place]  [facility], [facility] Health Nurse Care Coordinator  Direct Dial: [phone] Website: [email or web address]

## 2023-06-16 NOTE — Patient Instructions (Signed)
 Visit Information  Thank you for taking time to visit with me today. Please don't hesitate to contact me if I can be of assistance to you before our next scheduled appointment.  Our next appointment is by telephone on 07/14/23 at 12:00 PM Please call the care guide team at [phone] if you need to cancel or reschedule your appointment.   Following is a copy of your care plan:   Goals Addressed             This Visit's Progress    COMPLETED: To establish with an Eye Specialist for cataract removal       Care Coordination Interventions: Evaluation of current treatment plan related to right eye Cataract Extraction  and patient's adherence to plan as established by provider Determined patient would like a referral for an Eye Specialist to consult for cataract removal Discussed he plans to ask the new Eye Specialist in [place] to refer him to a [place] doctor, he will coordinate his Cataract removal with his daughters when the time comes        COMPLETED: To improve HDL       Care Coordination Interventions: Provider established cholesterol goals reviewed Provided verbal educational to include dietary recommendations to add healthy fats  Reviewed exercise goals and target of 150 minutes per week Lipid Panel     Component Value Date/Time   CHOL 104 10/21/2022 1115   TRIG 45 10/21/2022 1115   HDL 38 (L) 10/21/2022 1115   CHOLHDL 2.7 10/21/2022 1115   CHOLHDL 3.1 08/05/2015 1050   VLDL 14 08/05/2015 1050   LDLCALC 55 10/21/2022 1115   LABVLDL 11 10/21/2022 1115        COMPLETED: To work on lowering A1c       Care Coordination Interventions: Provided education to patient about basic disease process for prediabetes Review of patient status, including review of consultants reports, relevant laboratory and other test results, and medications completed Positive reinforcement given to patient for making efforts to improve his diabetes and overall health   Diabetes  Interventions: Assessed patient's understanding of A1c goal: <7% Lab Results  Component Value Date   HGBA1C 5.9 (H) 04/26/2023           VBCI RN Care Plan related to Hypertensive Heart disease       Problems:  Chronic Disease Management support and education needs related to HTN  Goal: Over the next 30-45 days the Patient will continue to work with Medical illustrator and/or Social Worker to address care management and care coordination needs related to HTN as evidenced by adherence to care management team scheduled appointments      Interventions:   Hypertension Interventions: Last practice recorded BP readings:  BP Readings from Last 3 Encounters:  06/16/23 (!) 160/77  04/26/23 134/70  10/21/22 122/70   Most recent eGFR/CrCl:  Lab Results  Component Value Date   EGFR 76 04/26/2023    No components found for: "CRCL"  Evaluation of current treatment plan related to hypertension self management and patient's adherence to plan as established by provider Provided education to patient re: stroke prevention, s/s of heart attack and stroke Reviewed medications with patient and discussed importance of compliance Advised patient, providing education and rationale, to monitor blood pressure daily and record, calling PCP for findings outside established parameters Assessed social determinant of health barriers  Patient Self-Care Activities:  Attend all scheduled provider appointments Attend church or other social activities Call provider office for new concerns or questions  Take medications as prescribed   check blood pressure daily write blood pressure results in a log or diary keep a blood pressure log call doctor for signs and symptoms of high blood pressure keep all doctor appointments take medications for blood pressure exactly as prescribed report new symptoms to your doctor  Plan:  Telephone follow up appointment with care management team member scheduled for:  07/14/23  @12 :00 PM              Please call 1-800-273-TALK (toll free, 24 hour hotline) if you are experiencing a Mental Health or Behavioral Health Crisis or need someone to talk to.  Patient verbalizes understanding of instructions and care plan provided today and agrees to view in MyChart. Active MyChart status and patient understanding of how to access instructions and care plan via MyChart confirmed with patient.     Louanne Roussel RN BSN CCM [place]  [facility], [facility] Health Nurse Care Coordinator  Direct Dial: [phone] Website: [email or web address]

## 2023-07-14 ENCOUNTER — Other Ambulatory Visit: Payer: Self-pay

## 2023-07-14 NOTE — Patient Outreach (Signed)
 Complex Care Management   Visit Note  07/14/2023  Name:  Eugene Murray MRN: [id number] DOB: November 21, 1940  Situation: Referral received for Complex Care Management related to Essential Hypertension. I obtained verbal consent from Patient.  Visit completed with patient on the phone.  Background:   Past Medical History:  Diagnosis Date   Allergic rhinitis    Allergy    Cancer (HCC)    Prostate cancer   Cataract    Coronary artery disease    History of prostate cancer    Hyperlipidemia    Hypertension    Hypokalemia    Nephritis    as a child   OSA (obstructive sleep apnea)    cpap machine at home   Sleep apnea    Stroke [facility])    "mini stroke" TIA    Assessment: Patient Reported Symptoms:  Cognitive Cognitive Status: Alert and oriented to person, place, and time Cognitive/Intellectual Conditions Management [RPT]: None reported or documented in medical history or problem list   Health Maintenance Behaviors: Annual physical exam, Healthy diet, Immunizations, Sleep adequate  Neurological Neurological Review of Symptoms: Not assessed    HEENT HEENT Symptoms Reported: Not assessed      Cardiovascular Cardiovascular Symptoms Reported: No symptoms reported Does patient have uncontrolled Hypertension?: No Cardiovascular Conditions: High blood cholesterol (Atherosclerotic Heart Disease; Essential Hypertension) Cardiovascular Management Strategies: Medication therapy, Routine screening, Diet modification Cardiovascular Self-Management Outcome: 4 (good)  Respiratory Respiratory Symptoms Reported: Not assesed    Endocrine Patient reports the following symptoms related to hypoglycemia or hyperglycemia : Not assessed    Gastrointestinal Gastrointestinal Symptoms Reported: Not assessed      Genitourinary Genitourinary Symptoms Reported: Not assessed    Integumentary Integumentary Symptoms Reported: Not assessed    Musculoskeletal Musculoskelatal Symptoms Reviewed: Not assessed         Psychosocial Psychosocial Symptoms Reported: Not assessed     Quality of Family Relationships: supportive, involved, helpful      06/16/2023    1:50 PM  Depression screen PHQ 2/9  Decreased Interest 0  Down, Depressed, Hopeless 0  PHQ - 2 Score 0    Vitals:   07/14/23 1205  BP: 118/63  Pulse: (!) 53    Medications Reviewed Today     Reviewed by Kaylene Pascal, RN (Registered Nurse) on 07/14/23 at 1217  Med List Status: <None>   Medication Order Taking? Sig Documenting Provider Last Dose Status Informant  acetaminophen (TYLENOL) 500 MG tablet [id number] No Take 500 mg by mouth every 6 (six) hours as needed for mild pain (pain score 1-3). [provider] Taking Active   amLODipine  (NORVASC ) 10 MG tablet [id number] No TAKE 1 TABLET(10 MG) BY MOUTH DAILY Millicent Ally, MD Taking Active   atorvastatin  (LIPITOR ) 40 MG tablet [id number] No TAKE 1 TABLET(40 MG) BY MOUTH DAILY Millicent Ally, MD Taking Active   Cholecalciferol (VITAMIN D) 2000 units CAPS [id number] No Take 2,000 Units by mouth daily.  Patient not taking: Reported on 06/16/2023   [provider] Not Taking Active   clopidogrel  (PLAVIX ) 75 MG tablet [id number] No TAKE 1 TABLET BY MOUTH EVERY DAY Millicent Ally, MD Taking Active   EPINEPHrine  (EPIPEN  2-PAK) 0.3 mg/0.3 mL IJ SOAJ injection [id number] No Inject 0.3 mg into the muscle as needed for anaphylaxis. Cleave Curling, MD Taking Active   hydrochlorothiazide  (MICROZIDE ) 12.5 MG capsule [id number] No TAKE 1 CAPSULE(12.5 MG) BY MOUTH DAILY Cleave Curling, MD Taking Active   isosorbide  mononitrate (IMDUR ) 30 MG 24  hr tablet [id number] No Take 1 tablet (30 mg total) by mouth daily. Millicent Ally, MD Taking Active   lisinopril  (ZESTRIL ) 40 MG tablet [id number] No Take 1 tablet (40 mg total) by mouth daily. Cleave Curling, MD Taking Active   magnesium oxide (MAG-OX) 400 MG tablet [id number] No Take 400 mg by mouth daily.  Patient not taking: Reported on  06/16/2023   [provider] Not Taking Active   metoprolol  tartrate (LOPRESSOR ) 50 MG tablet [id number] No Take 1 tablet (50 mg total) by mouth 2 (two) times daily. Millicent Ally, MD Taking Active   Multiple Vitamin (MULTIVITAMIN) tablet [id number] No Take 1 tablet by mouth daily. [provider] Taking Active   nitroGLYCERIN  (NITROSTAT ) 0.4 MG SL tablet [id number] No Place 1 tablet (0.4 mg total) under the tongue every 5 (five) minutes as needed for chest pain. Millicent Ally, MD Taking Active   Med List Note Linder Revere, Tereso Fenton, MD 09/12/10 2159): CPAP [place] Manna            Recommendation:   PCP Follow-up  Follow Up Plan:   Telephone follow up appointment date/time:  Friday, June 13 at 1:30 PM  Louanne Roussel RN BSN CCM [place]  [facility], [facility] Health Nurse Care Coordinator  Direct Dial: [phone] Website: [email or web address]

## 2023-07-14 NOTE — Patient Instructions (Signed)
 Visit Information  Thank you for taking time to visit with me today. Please don't hesitate to contact me if I can be of assistance to you before our next scheduled appointment.  Your next care management appointment is by telephone on 08/12/23 at 1:30 PM  Please call the care guide team at [phone] if you need to cancel, schedule, or reschedule an appointment.   Please call 1-800-273-TALK (toll free, 24 hour hotline) if you are experiencing a Mental Health or Behavioral Health Crisis or need someone to talk to.  Louanne Roussel RN BSN CCM [place]  [facility], [facility] Nurse Care Coordinator  Direct Dial: [phone] Website: [email or web address]

## 2023-07-29 ENCOUNTER — Ambulatory Visit (HOSPITAL_COMMUNITY): Admission: EM | Admit: 2023-07-29 | Discharge: 2023-07-29 | Disposition: A | Discharge status: Home / Self Care

## 2023-07-29 ENCOUNTER — Encounter (HOSPITAL_COMMUNITY): Payer: Self-pay

## 2023-07-29 DIAGNOSIS — W57XXXA Bitten or stung by nonvenomous insect and other nonvenomous arthropods, initial encounter: Secondary | ICD-10-CM

## 2023-07-29 DIAGNOSIS — S30861A Insect bite (nonvenomous) of abdominal wall, initial encounter: Secondary | ICD-10-CM

## 2023-07-29 NOTE — Discharge Instructions (Signed)
 Wash area normally with mild soap and water. Don't use any peroxide or alcohol as it will irritate the skin  Monitor for any signs of localized infection - increased pain, redness, swelling, or foul drainage. Please return if needed

## 2023-07-29 NOTE — ED Provider Notes (Signed)
 [facility]    CSN: [id number] Arrival date & time: 07/29/23  1655      History   Chief Complaint Chief Complaint  Patient presents with   Insect Bite    tick    HPI Eugene Murray is a 83 y.o. male.  Here with tick bite on the lower abdomen Was doing yard work yesterday. Saw tick in the shower last night. He put alcohol on it and pulled it off with tweezers. Brought the tick in a Ziploc today, showed to this provider. Tick is flat with intact head/jaws.  Patient not having any pain, redness, swelling, drainage from area. Wanted to make sure he didn't need any medicines  Past Medical History:  Diagnosis Date   Allergic rhinitis    Allergy    Cancer (HCC)    Prostate cancer   Cataract    Coronary artery disease    History of prostate cancer    Hyperlipidemia    Hypertension    Hypokalemia    Nephritis    as a child   OSA (obstructive sleep apnea)    cpap machine at home   Sleep apnea    Stroke [facility])    "mini stroke" TIA    Patient Active Problem List   Diagnosis Date Noted   Abnormal glucose 05/02/2023   Pure hypercholesterolemia 04/26/2023   Hypertensive heart disease without heart failure 11/01/2022   Nocturia 11/01/2022   Chronic cough 11/01/2022   Thrombocytopenia (HCC) 11/01/2022   Dysphagia 11/01/2022   Other specified disorders of carbohydrate metabolism (HCC) 10/21/2022   Chest pain with moderate risk for cardiac etiology 03/25/2020   Right carotid bruit 03/25/2020   History of CVA (cerebrovascular accident) 04/02/2016   Back pain, chronic 07/26/2012   Right leg weakness 07/26/2012   Migraine variant 07/26/2012   Stroke-like symptoms 07/26/2012   Sinus bradycardia 06/30/2012   Atherosclerotic heart disease 06/30/2012   Dyslipidemia 06/30/2012   Hypokalemia 06/30/2012   OSA on CPAP 05/05/2007   Essential hypertension 05/05/2007   ALLERGIC RHINITIS 05/05/2007   History of prostate cancer 05/05/2007    Past Surgical History:   Procedure Laterality Date   CARDIAC CATHETERIZATION     CAROTID STENT  11-08 and 4-11   stent x 3 (12/2006 x 2; 2011 x 1 stent)   CATARACT EXTRACTION W/PHACO Left 08/21/2014   Procedure: PHACO EMULSION CATARACT EXTRACTION AND INTRAOCULAR LENS PLACEMENT (IOC) IMPLANT LEFT EYE;  Surgeon: Ben Bracken, MD;  Location: [facility];  Service: Ophthalmology;  Laterality: Left;   COLONOSCOPY  03/01/2013   COLONOSCOPY W/ POLYPECTOMY     CORONARY ANGIOPLASTY     EYE SURGERY     HERNIA REPAIR Bilateral    x 2   different times   PROSTATE SURGERY     had cancer; radical resection 1997        Home Medications    Prior to Admission medications   Medication Sig Start Date End Date Taking? Authorizing Provider  amLODipine  (NORVASC ) 10 MG tablet TAKE 1 TABLET(10 MG) BY MOUTH DAILY 01/31/23  Yes Millicent Ally, MD  atorvastatin  (LIPITOR ) 40 MG tablet TAKE 1 TABLET(40 MG) BY MOUTH DAILY 12/14/22  Yes Millicent Ally, MD  clopidogrel  (PLAVIX ) 75 MG tablet TAKE 1 TABLET BY MOUTH EVERY DAY 01/31/23  Yes Millicent Ally, MD  EPINEPHrine  (EPIPEN  2-PAK) 0.3 mg/0.3 mL IJ SOAJ injection Inject 0.3 mg into the muscle as needed for anaphylaxis. 10/14/21   Cleave Curling, MD  hydrochlorothiazide  (MICROZIDE )  12.5 MG capsule TAKE 1 CAPSULE(12.5 MG) BY MOUTH DAILY 01/31/23  Yes Cleave Curling, MD  isosorbide  mononitrate (IMDUR ) 30 MG 24 hr tablet Take 1 tablet (30 mg total) by mouth daily. 09/01/22 09/01/23 Yes Millicent Ally, MD  lisinopril  (ZESTRIL ) 40 MG tablet Take 1 tablet (40 mg total) by mouth daily. 01/11/23  Yes Cleave Curling, MD  metoprolol  tartrate (LOPRESSOR ) 50 MG tablet Take 1 tablet (50 mg total) by mouth 2 (two) times daily. 11/30/22  Yes Millicent Ally, MD  Multiple Vitamin (MULTIVITAMIN) tablet Take 1 tablet by mouth daily.   Yes [provider]  nitroGLYCERIN  (NITROSTAT ) 0.4 MG SL tablet Place 1 tablet (0.4 mg total) under the tongue every 5 (five) minutes as needed for chest pain. 11/30/22   Millicent Ally, MD    Family History Family History  Problem Relation Age of Onset   Colon cancer Father        possibly age 13 y.o diagnosed    Cancer Father    Stroke Mother    Asthma Sister     Social History Social History   Tobacco Use   Smoking status: Former    Current packs/day: 0.00    Types: Cigarettes    Start date: 09/09/1970    Quit date: 09/08/1985    Years since quitting: 37.9   Smokeless tobacco: Never  Vaping Use   Vaping status: Never Used  Substance Use Topics   Alcohol use: Not Currently    Comment: seldom   Drug use: No     Allergies   Other, Penicillins, and Sulfa antibiotics   Review of Systems Review of Systems As per HPI   Physical Exam Triage Vital Signs ED Triage Vitals  Encounter Vitals Group     BP 07/29/23 1748 (!) 156/77     Systolic BP Percentile --      Diastolic BP Percentile --      Pulse Rate 07/29/23 1748 (!) 52     Resp 07/29/23 1748 16     Temp 07/29/23 1748 97.8 F (36.6 C)     Temp Source 07/29/23 1748 Oral     SpO2 07/29/23 1748 98 %     Weight 07/29/23 1747 190 lb (86.2 kg)     Height 07/29/23 1747 5\' 10"  (1.778 m)     Head Circumference --      Peak Flow --      Pain Score 07/29/23 1746 0     Pain Loc --      Pain Education --      Exclude from Growth Chart --    No data found.  Updated Vital Signs BP (!) 156/77 (BP Location: Left Arm)   Pulse (!) 52   Temp 97.8 F (36.6 C) (Oral)   Resp 16   Ht 5\' 10"  (1.778 m)   Wt 190 lb (86.2 kg)   SpO2 98%   BMI 27.26 kg/m   Physical Exam Vitals and nursing note reviewed.  Constitutional:      Appearance: He is not ill-appearing.  HENT:     Nose: No rhinorrhea.     Mouth/Throat:     Mouth: Mucous membranes are moist.     Pharynx: Oropharynx is clear. No posterior oropharyngeal erythema.  Eyes:     Conjunctiva/sclera: Conjunctivae normal.  Cardiovascular:     Rate and Rhythm: Normal rate and regular rhythm.     Pulses: Normal pulses.     Heart sounds:  Normal heart sounds.  Pulmonary:     Effort: Pulmonary effort is normal.     Breath sounds: Normal breath sounds.  Abdominal:     Palpations: Abdomen is soft.     Tenderness: There is no abdominal tenderness.  Musculoskeletal:     Cervical back: Normal range of motion.  Lymphadenopathy:     Cervical: No cervical adenopathy.  Skin:    General: Skin is warm and dry.     Comments: No erythema, fluctuance, induration, warmth, or tenderness at area of tick bite. Abdomen non tender throughout   Neurological:     Mental Status: He is alert and oriented to person, place, and time.      UC Treatments / Results  Labs (all labs ordered are listed, but only abnormal results are displayed) Labs Reviewed - No data to display  EKG  Radiology No results found.  Procedures Procedures (including critical care time)  Medications Ordered in UC Medications - No data to display  Initial Impression / Assessment and Plan / UC Course  I have reviewed the triage vital signs and the nursing notes.  Pertinent labs & imaging results that were available during my care of the patient were reviewed by me and considered in my medical decision making (see chart for details).  HR 55. Takes metoprolol . Not having symptoms, feeling healthy No signs of infection at tick bite site. Tick flat, head and jaws removed fully, no remaining foreign body. Discussed keep area clean and monitor for any localized signs of infection. Patient agrees to plan, no questions   Final Clinical Impressions(s) / UC Diagnoses   Final diagnoses:  Tick bite of abdominal wall, initial encounter     Discharge Instructions      Wash area normally with mild soap and water. Don't use any peroxide or alcohol as it will irritate the skin  Monitor for any signs of localized infection - increased pain, redness, swelling, or foul drainage. Please return if needed   ED Prescriptions   None    PDMP not reviewed this  encounter.   Newton Barer 07/29/23 1919

## 2023-07-29 NOTE — ED Triage Notes (Signed)
 Patient presenting with tic bite bellow the belly button onset last night. Patient saw the tic while in the shower and removed it. States he was doing yard work yesterday.   Prescriptions or OTC medications tried: No

## 2023-08-04 NOTE — Patient Outreach (Signed)
 Received voice message from patient requesting a return call. Placed unsuccessful call to patient, left a voice message requesting a return call if assistance is needed.   Louanne Roussel RN BSN CCM [place]  [facility], [facility] Nurse Care Coordinator  Direct Dial: [phone] Website: [email or web address]

## 2023-08-12 ENCOUNTER — Telehealth: Payer: Self-pay

## 2023-08-12 NOTE — Progress Notes (Unsigned)
 Complex Care Management Care Guide Note  08/12/2023 Name: Eugene Murray MRN: [id number] DOB: October 29, 1940  Eugene Murray is a 83 y.o. year old male who is a primary care patient of Cleave Curling, MD and is actively engaged with the care management team. I reached out to Hilma Lucks by phone today to assist with re-scheduling  with the RN Case Manager.  Follow up plan: Unsuccessful telephone outreach attempt made. A HIPAA compliant phone message was left for the patient providing contact information and requesting a return call.  Creola Doheny [facility], [facility] Guide  Direct Dial: [phone]  Fax [phone]

## 2023-08-16 DIAGNOSIS — N3941 Urge incontinence: Secondary | ICD-10-CM | POA: Diagnosis not present

## 2023-08-16 DIAGNOSIS — R159 Full incontinence of feces: Secondary | ICD-10-CM | POA: Diagnosis not present

## 2023-08-16 DIAGNOSIS — M6281 Muscle weakness (generalized): Secondary | ICD-10-CM | POA: Diagnosis not present

## 2023-08-16 DIAGNOSIS — M6289 Other specified disorders of muscle: Secondary | ICD-10-CM | POA: Diagnosis not present

## 2023-09-05 ENCOUNTER — Other Ambulatory Visit: Payer: Self-pay | Admitting: Cardiovascular Disease

## 2023-09-05 ENCOUNTER — Other Ambulatory Visit: Payer: Self-pay

## 2023-09-05 MED ORDER — METOPROLOL TARTRATE 50 MG PO TABS: 50.0000 mg | ORAL_TABLET | Freq: Two times a day (BID) | ORAL | 0 refills | Status: DC

## 2023-09-16 ENCOUNTER — Telehealth: Payer: Self-pay

## 2023-09-27 DIAGNOSIS — H2511 Age-related nuclear cataract, right eye: Secondary | ICD-10-CM | POA: Diagnosis not present

## 2023-09-27 DIAGNOSIS — H43813 Vitreous degeneration, bilateral: Secondary | ICD-10-CM | POA: Diagnosis not present

## 2023-09-28 DIAGNOSIS — R351 Nocturia: Secondary | ICD-10-CM | POA: Diagnosis not present

## 2023-09-28 DIAGNOSIS — R159 Full incontinence of feces: Secondary | ICD-10-CM | POA: Diagnosis not present

## 2023-09-28 DIAGNOSIS — M62838 Other muscle spasm: Secondary | ICD-10-CM | POA: Diagnosis not present

## 2023-09-28 DIAGNOSIS — M6281 Muscle weakness (generalized): Secondary | ICD-10-CM | POA: Diagnosis not present

## 2023-09-28 DIAGNOSIS — N393 Stress incontinence (female) (male): Secondary | ICD-10-CM | POA: Diagnosis not present

## 2023-09-29 ENCOUNTER — Other Ambulatory Visit (HOSPITAL_BASED_OUTPATIENT_CLINIC_OR_DEPARTMENT_OTHER): Payer: Self-pay

## 2023-09-29 MED ORDER — CLOPIDOGREL BISULFATE 75 MG PO TABS: 75.0000 mg | ORAL_TABLET | Freq: Every day | ORAL | 0 refills | Status: DC

## 2023-10-05 ENCOUNTER — Other Ambulatory Visit: Payer: Self-pay | Admitting: Internal Medicine

## 2023-10-05 NOTE — Telephone Encounter (Unsigned)
 Copied from CRM #[id number]. Topic: Clinical - Medication Question >> Oct 05, 2023  1:47 PM Kevelyn M wrote: Reason for CRM: Patient is request a refill for clopidogrel  (PLAVIX ) 75 MG tablet. Dr. Jarold did not write this prescription, his cardiologist did and will not refill this because the patient has not seen them in  a year. Patient is asking if Dr. Jarold will at least fill this prescription until his appointment 10/10/2023. He takes this medication everyday. He is currently out and has not taken this medication in 3 days.  [id number] [address], [place] - [id number] AT [facility] [address], [place] [address] Phone: [phone]  Fax: [phone]   Call back #[phone]

## 2023-10-07 MED ORDER — CLOPIDOGREL BISULFATE 75 MG PO TABS: 75.0000 mg | ORAL_TABLET | Freq: Every day | ORAL | 0 refills | Status: DC

## 2023-10-10 ENCOUNTER — Other Ambulatory Visit: Payer: Self-pay | Admitting: *Deleted

## 2023-10-10 ENCOUNTER — Encounter: Payer: Self-pay | Admitting: Internal Medicine

## 2023-10-10 ENCOUNTER — Ambulatory Visit: Payer: Medicare Other | Admitting: Internal Medicine

## 2023-10-10 ENCOUNTER — Other Ambulatory Visit: Payer: Self-pay | Admitting: General Practice

## 2023-10-10 VITALS — BP 128/74 | HR 55 | Temp 97.6°F | Ht 70.0 in | Wt 192.0 lb

## 2023-10-10 DIAGNOSIS — E663 Overweight: Secondary | ICD-10-CM

## 2023-10-10 DIAGNOSIS — Z6827 Body mass index (BMI) 27.0-27.9, adult: Secondary | ICD-10-CM | POA: Insufficient documentation

## 2023-10-10 DIAGNOSIS — I25118 Atherosclerotic heart disease of native coronary artery with other forms of angina pectoris: Secondary | ICD-10-CM | POA: Diagnosis not present

## 2023-10-10 DIAGNOSIS — E7800 Pure hypercholesterolemia, unspecified: Secondary | ICD-10-CM | POA: Diagnosis not present

## 2023-10-10 DIAGNOSIS — D696 Thrombocytopenia, unspecified: Secondary | ICD-10-CM

## 2023-10-10 DIAGNOSIS — Z79899 Other long term (current) drug therapy: Secondary | ICD-10-CM

## 2023-10-10 DIAGNOSIS — R7309 Other abnormal glucose: Secondary | ICD-10-CM

## 2023-10-10 DIAGNOSIS — I119 Hypertensive heart disease without heart failure: Secondary | ICD-10-CM | POA: Diagnosis not present

## 2023-10-10 MED ORDER — CLOPIDOGREL BISULFATE 75 MG PO TABS
75.0000 mg | ORAL_TABLET | Freq: Every day | ORAL | 0 refills | Status: DC
Start: 2023-10-10 — End: 2023-10-27

## 2023-10-10 MED ORDER — METOPROLOL TARTRATE 50 MG PO TABS: 50.0000 mg | ORAL_TABLET | Freq: Two times a day (BID) | ORAL | 0 refills | Status: DC

## 2023-10-10 MED ORDER — ISOSORBIDE MONONITRATE ER 30 MG PO TB24: 30.0000 mg | ORAL_TABLET | Freq: Every day | ORAL | 0 refills | Status: DC

## 2023-10-10 MED ORDER — ATORVASTATIN CALCIUM 40 MG PO TABS: 40.0000 mg | ORAL_TABLET | Freq: Every day | ORAL | 0 refills | Status: DC

## 2023-10-10 NOTE — Assessment & Plan Note (Signed)
 Chronic, LDL goal is less than 70 due to underlying CAD.  He will continue with atorvastatin 40mg  daily.

## 2023-10-10 NOTE — Assessment & Plan Note (Signed)
 Chronic, controlled. Goal BP<120/80.SABRA He will continue with amlodipine  10mg , hydrochlorothiazide  12.5mg  daily, lisinopril  40mg  daily and metoprolol  50mg  twice daily. He is encouraged to follow heart healthy, low sodium diet.

## 2023-10-10 NOTE — Assessment & Plan Note (Signed)
 I will check CBC to assess platelet count, this has been stable.

## 2023-10-10 NOTE — Assessment & Plan Note (Signed)
 BMI is acceptable for his demographic. He is encouraged to aim for at least 150 minutes of exercise per week.

## 2023-10-10 NOTE — Progress Notes (Signed)
 I,Victoria T Emmitt, CMA,acting as a Neurosurgeon for Catheryn LOISE Slocumb, MD.,have documented all relevant documentation on the behalf of Catheryn LOISE Slocumb, MD,as directed by  Catheryn LOISE Slocumb, MD while in the presence of Catheryn LOISE Slocumb, MD.  Subjective:  Patient ID: Eugene Murray , male    DOB: 1940/05/12 , 83 y.o.   MRN: [id number]  Chief Complaint  Patient presents with   Hypertension    Patient presents today for bp & cholesterol follow up. He reports compliance with medications. Denies headache, chest pain & sob. He reports his cardiologist, Dr Burnard has retired. He wants to know how is he to go about getting medications prescribed by him.    Hyperlipidemia    HPI Discussed the use of AI scribe software for clinical note transcription with the patient, who gave verbal consent to proceed.  History of Present Illness Eugene Murray is an 83 year old male with hypertension and coronary artery disease who presents for a blood pressure check and medication refill.  He is experiencing difficulty obtaining his medication, clopidogrel , as he has not had it for five days due to a refill issue. His previous cardiologist retired, and he has not had a cardiology appointment in over a year. He is unsure of when his last appointment was and has not been reassigned to a new cardiologist yet. He also reports issues with his isosorbide  mononitrate prescription, which was not filled because a doctor's authorization was needed. He received a notice from the pharmacy regarding this. His metoprolol  prescription was sent to [facility], and he plans to check with the pharmacy. He reports that he is taking atorvastatin  for cholesterol management.  No chest pain or shortness of breath. He mentions a small red spot on his thigh, which he has noticed before when taking blood thinners and aspirin , but he does not express significant concern about it.  He discusses his urology care, mentioning that he performs pelvic floor  exercises at home and finds them helpful. He reports a decrease in nocturia and states he does not wake up as often at night.  He mentions a toothache and difficulty eating due to this. He has not seen a dentist in several years but has an upcoming appointment with Dr. Glenys Landau.    He discusses his social situation, noting that his daughter visits every three to four weeks to assist with caregiving duties for his wife and her aunt. He also mentions having a caregiver who assists three days a week, but he would prefer more frequent help. He describes his activities, including mowing the lawn and walking, and notes that he has been to the [employer] twice in the past week.     Hypertension This is a chronic problem. The current episode started more than 1 year ago. The problem has been gradually improving since onset. The problem is controlled. Risk factors for coronary artery disease include dyslipidemia and male gender. The current treatment provides moderate improvement. Hypertensive end-organ damage includes CAD/MI.  Hyperlipidemia This is a chronic problem. The current episode started more than 1 year ago. He has no history of diabetes. Current antihyperlipidemic treatment includes statins and exercise. Risk factors for coronary artery disease include dyslipidemia, male sex and hypertension.     Past Medical History:  Diagnosis Date   Allergic rhinitis    Allergy    Cancer [facility])    Prostate cancer   Cataract    Coronary artery disease    History of prostate cancer  Hyperlipidemia    Hypertension    Hypokalemia    Nephritis    as a child   OSA (obstructive sleep apnea)    cpap machine at home   Sleep apnea    Stroke [facility])    mini stroke TIA     Family History  Problem Relation Age of Onset   Colon cancer Father        possibly age 43 y.o diagnosed    Cancer Father    Stroke Mother    Asthma Sister      Current Outpatient Medications:    amLODipine  (NORVASC ) 10 MG  tablet, TAKE 1 TABLET(10 MG) BY MOUTH DAILY, Disp: 90 tablet, Rfl: 1   atorvastatin  (LIPITOR ) 40 MG tablet, Take 1 tablet (40 mg total) by mouth daily., Disp: 30 tablet, Rfl: 0   EPINEPHrine  (EPIPEN  2-PAK) 0.3 mg/0.3 mL IJ SOAJ injection, Inject 0.3 mg into the muscle as needed for anaphylaxis., Disp: 2 each, Rfl: 1   hydrochlorothiazide  (MICROZIDE ) 12.5 MG capsule, TAKE 1 CAPSULE(12.5 MG) BY MOUTH DAILY, Disp: 90 capsule, Rfl: 2   isosorbide  mononitrate (IMDUR ) 30 MG 24 hr tablet, Take 1 tablet (30 mg total) by mouth daily., Disp: 30 tablet, Rfl: 0   lisinopril  (ZESTRIL ) 40 MG tablet, Take 1 tablet (40 mg total) by mouth daily., Disp: 90 tablet, Rfl: 3   metoprolol  tartrate (LOPRESSOR ) 50 MG tablet, Take 1 tablet (50 mg total) by mouth 2 (two) times daily., Disp: 30 tablet, Rfl: 0   Multiple Vitamin (MULTIVITAMIN) tablet, Take 1 tablet by mouth daily., Disp: , Rfl:    nitroGLYCERIN  (NITROSTAT ) 0.4 MG SL tablet, Place 1 tablet (0.4 mg total) under the tongue every 5 (five) minutes as needed for chest pain., Disp: 25 tablet, Rfl: 2   clopidogrel  (PLAVIX ) 75 MG tablet, Take 1 tablet (75 mg total) by mouth daily., Disp: 90 tablet, Rfl: 0   Allergies  Allergen Reactions   Other Hives    SEAFOOD   Penicillins Other (See Comments)    caused me to pass out   Sulfa Antibiotics Hives     Review of Systems  Constitutional: Negative.   Respiratory: Negative.    Cardiovascular: Negative.   Gastrointestinal: Negative.   Endocrine: Negative.   Skin: Negative.   Allergic/Immunologic: Negative.   Hematological: Negative.      Today's Vitals   10/10/23 1442  BP: 128/74  Pulse: (!) 55  Temp: 97.6 F (36.4 C)  SpO2: 98%  Weight: 192 lb (87.1 kg)  Height: 5' 10 (1.778 m)   Body mass index is 27.55 kg/m.  Wt Readings from Last 3 Encounters:  10/10/23 192 lb (87.1 kg)  07/29/23 190 lb (86.2 kg)  06/16/23 185 lb (83.9 kg)     Objective:  Physical Exam Vitals and nursing note reviewed.   Constitutional:      Appearance: Normal appearance.  HENT:     Head: Normocephalic and atraumatic.  Eyes:     Extraocular Movements: Extraocular movements intact.  Cardiovascular:     Rate and Rhythm: Normal rate and regular rhythm.     Heart sounds: Normal heart sounds.  Pulmonary:     Effort: Pulmonary effort is normal.     Breath sounds: Normal breath sounds.  Genitourinary:    Comments: Deferred  Musculoskeletal:     Cervical back: Normal range of motion.  Skin:    General: Skin is warm.  Neurological:     General: No focal deficit present.     Mental  Status: He is alert.  Psychiatric:        Mood and Affect: Mood normal.       Assessment And Plan:  Hypertensive heart disease without heart failure Assessment & Plan: Chronic, controlled. Goal BP<120/80.SABRA He will continue with amlodipine  10mg , hydrochlorothiazide  12.5mg  daily, lisinopril  40mg  daily and metoprolol  50mg  twice daily. He is encouraged to follow heart healthy, low sodium diet.   Orders: -     CMP14+EGFR -     Lipid panel  Atherosclerosis of native coronary artery of native heart with other form of angina pectoris [facility]) Assessment & Plan: Chronic, he is s/p PCI in 2008 and 2011.  He is encouraged to follow a heart healthy lifestyle. He will continue with isosorbide  mononitrate 30mg  daily, atorvastatin  40mg  daily and metoprolol  tartrate 50mg  twice daily. He is also still on Plavix  as per Cardiology. Currently on clopidogrel . Missed cardiology appointment led to medication access issue. No cardiologist seen since Dr. Joesphine retirement. - Coordinate with referral coordinator to reassign to a new cardiologist. - Provide contact information for scheduling a cardiology appointment. - Advise to pick up clopidogrel  prescription from [facility]. - Ensure follow-up with cardiologist for continued medication refills.   Pure hypercholesterolemia Assessment & Plan: Chronic, LDL goal is less than 70 due to underlying  CAD.  He will continue with atorvastatin  40mg  daily.    Orders: -     CMP14+EGFR -     Lipid panel -     TSH  Thrombocytopenia (HCC) Assessment & Plan: I will check CBC to assess platelet count, this has been stable.    Orders: -     CBC  Abnormal glucose -     CMP14+EGFR -     Hemoglobin A1c  Overweight with body mass index (BMI) of 27 to 27.9 in adult Assessment & Plan: BMI is acceptable for his demographic. He is encouraged to aim for at least 150 minutes of exercise per week.    Other orders -     Clopidogrel  Bisulfate; Take 1 tablet (75 mg total) by mouth daily.  Dispense: 90 tablet; Refill: 0  Return in 6 months (on 04/11/2024), or bp check, for 4 weeks [place] - flu shot.  Patient was given opportunity to ask questions. Patient verbalized understanding of the plan and was able to repeat key elements of the plan. All questions were answered to their satisfaction.   I, Catheryn LOISE Slocumb, MD, have reviewed all documentation for this visit. The documentation on 10/10/23 for the exam, diagnosis, procedures, and orders are all accurate and complete.   IF YOU HAVE BEEN REFERRED TO A SPECIALIST, IT MAY TAKE 1-2 WEEKS TO SCHEDULE/PROCESS THE REFERRAL. IF YOU HAVE NOT HEARD FROM US /SPECIALIST IN TWO WEEKS, PLEASE GIVE US  A CALL AT [phone] X [phone].   THE PATIENT IS ENCOURAGED TO PRACTICE SOCIAL DISTANCING DUE TO THE COVID-19 PANDEMIC.

## 2023-10-10 NOTE — Patient Instructions (Signed)
 Hypertension, Adult Hypertension is another name for high blood pressure. High blood pressure forces your heart to work harder to pump blood. This can cause problems over time. There are two numbers in a blood pressure reading. There is a top number (systolic) over a bottom number (diastolic). It is best to have a blood pressure that is below 120/80. What are the causes? The cause of this condition is not known. Some other conditions can lead to high blood pressure. What increases the risk? Some lifestyle factors can make you more likely to develop high blood pressure: Smoking. Not getting enough exercise or physical activity. Being overweight. Having too much fat, sugar, calories, or salt (sodium) in your diet. Drinking too much alcohol . Other risk factors include: Having any of these conditions: Heart disease. Diabetes. High cholesterol. Kidney disease. Obstructive sleep apnea. Having a family history of high blood pressure and high cholesterol. Age. The risk increases with age. Stress. What are the signs or symptoms? High blood pressure may not cause symptoms. Very high blood pressure (hypertensive crisis) may cause: Headache. Fast or uneven heartbeats (palpitations). Shortness of breath. Nosebleed. Vomiting or feeling like you may vomit (nauseous). Changes in how you see. Very bad chest pain. Feeling dizzy. Seizures. How is this treated? This condition is treated by making healthy lifestyle changes, such as: Eating healthy foods. Exercising more. Drinking less alcohol . Your doctor may prescribe medicine if lifestyle changes do not help enough and if: Your top number is above 130. Your bottom number is above 80. Your personal target blood pressure may vary. Follow these instructions at home: Eating and drinking  If told, follow the DASH eating plan. To follow this plan: Fill one half of your plate at each meal with fruits and vegetables. Fill one fourth of your plate  at each meal with whole grains. Whole grains include whole-wheat pasta, brown rice, and whole-grain bread. Eat or drink low-fat dairy products, such as skim milk or low-fat yogurt. Fill one fourth of your plate at each meal with low-fat (lean) proteins. Low-fat proteins include fish, chicken without skin, eggs, beans, and tofu. Avoid fatty meat, cured and processed meat, or chicken with skin. Avoid pre-made or processed food. Limit the amount of salt in your diet to less than 1,500 mg each day. Do not drink alcohol  if: Your doctor tells you not to drink. You are pregnant, may be pregnant, or are planning to become pregnant. If you drink alcohol : Limit how much you have to: 0-1 drink a day for women. 0-2 drinks a day for men. Know how much alcohol  is in your drink. In the [place] one drink equals one 12 oz bottle of beer (355 mL), one 5 oz glass of wine (148 mL), or one 1 oz glass of hard liquor (44 mL). Lifestyle  Work with your doctor to stay at a healthy weight or to lose weight. Ask your doctor what the best weight is for you. Get at least 30 minutes of exercise that causes your heart to beat faster (aerobic exercise) most days of the week. This may include walking, swimming, or biking. Get at least 30 minutes of exercise that strengthens your muscles (resistance exercise) at least 3 days a week. This may include lifting weights or doing Pilates. Do not smoke or use any products that contain nicotine  or tobacco. If you need help quitting, ask your doctor. Check your blood pressure at home as told by your doctor. Keep all follow-up visits. Medicines Take over-the-counter and prescription medicines  only as told by your doctor. Follow directions carefully. Do not skip doses of blood pressure medicine. The medicine does not work as well if you skip doses. Skipping doses also puts you at risk for problems. Ask your doctor about side effects or reactions to medicines that you should watch  for. Contact a doctor if: You think you are having a reaction to the medicine you are taking. You have headaches that keep coming back. You feel dizzy. You have swelling in your ankles. You have trouble with your vision. Get help right away if: You get a very bad headache. You start to feel mixed up (confused). You feel weak or numb. You feel faint. You have very bad pain in your: Chest. Belly (abdomen). You vomit more than once. You have trouble breathing. These symptoms may be an emergency. Get help right away. Call 911. Do not wait to see if the symptoms will go away. Do not drive yourself to the hospital. Summary Hypertension is another name for high blood pressure. High blood pressure forces your heart to work harder to pump blood. For most people, a normal blood pressure is less than 120/80. Making healthy choices can help lower blood pressure. If your blood pressure does not get lower with healthy choices, you may need to take medicine. This information is not intended to replace advice given to you by your health care provider. Make sure you discuss any questions you have with your health care provider. Document Revised: 12/04/2020 Document Reviewed: 12/04/2020 Elsevier Patient Education  2024 [employer].

## 2023-10-10 NOTE — Assessment & Plan Note (Addendum)
 Chronic, he is s/p PCI in 2008 and 2011.  He is encouraged to follow a heart healthy lifestyle. He will continue with isosorbide  mononitrate 30mg  daily, atorvastatin  40mg  daily and metoprolol  tartrate 50mg  twice daily. He is also still on Plavix  as per Cardiology. Currently on clopidogrel . Missed cardiology appointment led to medication access issue. No cardiologist seen since Dr. Joesphine retirement. - Coordinate with referral coordinator to reassign to a new cardiologist. - Provide contact information for scheduling a cardiology appointment. - Advise to pick up clopidogrel  prescription from [facility]. - Ensure follow-up with cardiologist for continued medication refills.

## 2023-10-11 LAB — CMP14+EGFR
ALT: 15 IU/L (ref 0–44)
AST: 22 IU/L (ref 0–40)
Albumin: 4.1 g/dL (ref 3.7–4.7)
Alkaline Phosphatase: 74 IU/L (ref 44–121)
BUN/Creatinine Ratio: 13 (ref 10–24)
BUN: 14 mg/dL (ref 8–27)
Bilirubin Total: 1 mg/dL (ref 0.0–1.2)
CO2: 24 mmol/L (ref 20–29)
Calcium: 8.9 mg/dL (ref 8.6–10.2)
Chloride: 101 mmol/L (ref 96–106)
Creatinine, Ser: 1.05 mg/dL (ref 0.76–1.27)
Globulin, Total: 2.6 g/dL (ref 1.5–4.5)
Glucose: 77 mg/dL (ref 70–99)
Potassium: 4.1 mmol/L (ref 3.5–5.2)
Sodium: 141 mmol/L (ref 134–144)
Total Protein: 6.7 g/dL (ref 6.0–8.5)
eGFR: 70 mL/min/1.73 (ref >59)

## 2023-10-11 LAB — HEMOGLOBIN A1C
Est. average glucose Bld gHb Est-mCnc: 108 mg/dL
Hgb A1c MFr Bld: 5.4 % (ref 4.8–5.6)

## 2023-10-11 LAB — LIPID PANEL
Chol/HDL Ratio: 2.7 ratio (ref 0.0–5.0)
Cholesterol, Total: 112 mg/dL (ref 100–199)
HDL: 42 mg/dL (ref >39)
LDL Chol Calc (NIH): 58 mg/dL (ref 0–99)
Triglycerides: 48 mg/dL (ref 0–149)
VLDL Cholesterol Cal: 12 mg/dL (ref 5–40)

## 2023-10-11 LAB — CBC
Hematocrit: 40.7 % (ref 37.5–51.0)
Hemoglobin: 13.5 g/dL (ref 13.0–17.7)
MCH: 30.8 pg (ref 26.6–33.0)
MCHC: 33.2 g/dL (ref 31.5–35.7)
MCV: 93 fL (ref 79–97)
Platelets: 124 x10E3/uL — ABNORMAL LOW (ref 150–450)
RBC: 4.38 x10E6/uL (ref 4.14–5.80)
RDW: 12.6 % (ref 11.6–15.4)
WBC: 6.5 x10E3/uL (ref 3.4–10.8)

## 2023-10-11 LAB — TSH: TSH: 0.617 u[IU]/mL (ref 0.450–4.500)

## 2023-10-12 ENCOUNTER — Ambulatory Visit: Payer: Self-pay | Admitting: Internal Medicine

## 2023-10-12 ENCOUNTER — Telehealth: Payer: Self-pay

## 2023-10-12 DIAGNOSIS — I10 Essential (primary) hypertension: Secondary | ICD-10-CM | POA: Diagnosis not present

## 2023-10-12 DIAGNOSIS — H25011 Cortical age-related cataract, right eye: Secondary | ICD-10-CM | POA: Diagnosis not present

## 2023-10-12 DIAGNOSIS — Z961 Presence of intraocular lens: Secondary | ICD-10-CM | POA: Diagnosis not present

## 2023-10-12 DIAGNOSIS — H2511 Age-related nuclear cataract, right eye: Secondary | ICD-10-CM | POA: Diagnosis not present

## 2023-10-12 NOTE — Patient Outreach (Signed)
 Unable to reach patient to assess for goal outcomes after 4 unsuccessful attempts. Patient closed from complex case management.   Clayborne Ly RN BSN CCM [place]  [facility], [place] [facility] Nurse Care Coordinator  Direct Dial: [phone] Website: [email or web address]

## 2023-10-26 ENCOUNTER — Other Ambulatory Visit: Payer: Self-pay | Admitting: Internal Medicine

## 2023-10-27 ENCOUNTER — Ambulatory Visit: Payer: Self-pay

## 2023-11-02 ENCOUNTER — Ambulatory Visit

## 2023-11-02 DIAGNOSIS — Z0000 Encounter for general adult medical examination without abnormal findings: Secondary | ICD-10-CM

## 2023-11-02 NOTE — Patient Instructions (Signed)
 [name] , Thank you for taking time out of your busy schedule to complete your Annual Wellness Visit with me. I enjoyed our conversation and look forward to speaking with you again next year. I, as well as your care team,  appreciate your ongoing commitment to your health goals. Please review the following plan we discussed and let me know if I can assist you in the future. Your Game plan/ To Do List    Referrals: If you haven't heard from the office you've been referred to, please reach out to them at the phone provided.   Follow up Visits: We will see or speak with you next year for your Next Medicare AWV with our clinical staff Have you seen your provider in the last 6 months (3 months if uncontrolled diabetes)? Yes  Clinician Recommendations:  Aim for 30 minutes of exercise or brisk walking, 6-8 glasses of water, and 5 servings of fruits and vegetables each day.       This is a list of the screenings recommended for you:  Health Maintenance  Topic Date Due   Flu Shot  09/30/2023   COVID-19 Vaccine (4 - 2025-26 season) 10/31/2023   Medicare Annual Wellness Visit  11/01/2024   Pneumococcal Vaccine for age over 62  Completed   Zoster (Shingles) Vaccine  Completed   HPV Vaccine  Aged Out   Meningitis B Vaccine  Aged Out   DTaP/Tdap/Td vaccine  Discontinued   Hepatitis C Screening  Discontinued    Advanced directives: (Copy Requested) Please bring a copy of your health care power of attorney and living will to the office to be added to your chart at your convenience. You can mail to [facility] [address] [place], [place] [address] or email to [email or web address] Advance Care Planning is important because it:  [x]  Makes sure you receive the medical care that is consistent with your values, goals, and preferences  [x]  It provides guidance to your family and loved ones and reduces their decisional burden about whether or not they are making the right  decisions based on your wishes.  Follow the link provided in your after visit summary or read over the paperwork we have mailed to you to help you started getting your Advance Directives in place. If you need assistance in completing these, please reach out to us  so that we can help you!  See attachments for Preventive Care and Fall Prevention Tips.

## 2023-11-02 NOTE — Progress Notes (Signed)
 Subjective:   Eugene Murray is a 83 y.o. who presents for a Medicare Wellness preventive visit.  As a reminder, Annual Wellness Visits don't include a physical exam, and some assessments may be limited, especially if this visit is performed virtually. We may recommend an in-person follow-up visit with your provider if needed.  Visit Complete: Virtual I connected with  Eugene Murray on 11/02/23 by a audio enabled telemedicine application and verified that I am speaking with the correct person using two identifiers.  Patient Location: Home  Provider Location: Office/Clinic  I discussed the limitations of evaluation and management by telemedicine. The patient expressed understanding and agreed to proceed.  Vital Signs: Because this visit was a virtual/telehealth visit, some criteria may be missing or patient reported. Any vitals not documented were not able to be obtained and vitals that have been documented are patient reported.  VideoError- Librarian, academic were attempted between this provider and patient, however failed, due to patient having technical difficulties OR patient did not have access to video capability.  We continued and completed visit with audio only.   Persons Participating in Visit: Patient.  AWV Questionnaire: No: Patient Medicare AWV questionnaire was not completed prior to this visit.  Cardiac Risk Factors include: advanced age (>47men, >62 women);dyslipidemia;hypertension;male gender     Objective:    Today's Vitals   There is no height or weight on file to calculate BMI.     11/02/2023   11:43 AM 10/21/2022   10:38 AM 10/14/2021    9:54 AM 09/11/2020   11:27 AM 08/30/2019   10:34 AM 08/29/2018   11:09 AM 03/19/2015    8:35 AM  Advanced Directives  Does Patient Have a Medical Advance Directive? Yes Yes No Yes No No No   Type of Estate agent of [place];Living will Healthcare Power of [place];Living will   Healthcare Power of [place];Living will     Copy of Healthcare Power of Attorney in Chart? No - copy requested No - copy requested  No - copy requested     Would patient like information on creating a medical advance directive?   No - Patient declined  No - Patient declined No - Patient declined  No - patient declined information      Data saved with a previous flowsheet row definition    Current Medications (verified) Outpatient Encounter Medications as of 11/02/2023  Medication Sig   amLODipine  (NORVASC ) 10 MG tablet TAKE 1 TABLET(10 MG) BY MOUTH DAILY   atorvastatin  (LIPITOR ) 40 MG tablet Take 1 tablet (40 mg total) by mouth daily.   clopidogrel  (PLAVIX ) 75 MG tablet TAKE 1 TABLET(75 MG) BY MOUTH DAILY   EPINEPHrine  (EPIPEN  2-PAK) 0.3 mg/0.3 mL IJ SOAJ injection Inject 0.3 mg into the muscle as needed for anaphylaxis.   hydrochlorothiazide  (MICROZIDE ) 12.5 MG capsule TAKE 1 CAPSULE(12.5 MG) BY MOUTH DAILY   isosorbide  mononitrate (IMDUR ) 30 MG 24 hr tablet Take 1 tablet (30 mg total) by mouth daily.   lisinopril  (ZESTRIL ) 40 MG tablet Take 1 tablet (40 mg total) by mouth daily.   metoprolol  tartrate (LOPRESSOR ) 50 MG tablet Take 1 tablet (50 mg total) by mouth 2 (two) times daily.   Multiple Vitamin (MULTIVITAMIN) tablet Take 1 tablet by mouth daily.   nitroGLYCERIN  (NITROSTAT ) 0.4 MG SL tablet Place 1 tablet (0.4 mg total) under the tongue every 5 (five) minutes as needed for chest pain.   [DISCONTINUED] clopidogrel  (PLAVIX ) 75 MG tablet Take 1  tablet (75 mg total) by mouth daily.   No facility-administered encounter medications on file as of 11/02/2023.    Allergies (verified) Other, Penicillins, and Sulfa antibiotics   History: Past Medical History:  Diagnosis Date   Allergic rhinitis    Allergy    Cancer (HCC)    Prostate cancer   Cataract    Coronary artery disease    History of prostate cancer    Hyperlipidemia    Hypertension    Hypokalemia    Nephritis    as a child    OSA (obstructive sleep apnea)    cpap machine at home   Sleep apnea    Stroke [facility])    mini stroke TIA   Past Surgical History:  Procedure Laterality Date   CARDIAC CATHETERIZATION     CAROTID STENT  11-08 and 4-11   stent x 3 (12/2006 x 2; 2011 x 1 stent)   CATARACT EXTRACTION W/PHACO Left 08/21/2014   Procedure: PHACO EMULSION CATARACT EXTRACTION AND INTRAOCULAR LENS PLACEMENT (IOC) IMPLANT LEFT EYE;  Surgeon: Gaither Quan, MD;  Location: [facility] OR;  Service: Ophthalmology;  Laterality: Left;   COLONOSCOPY  03/01/2013   COLONOSCOPY W/ POLYPECTOMY     CORONARY ANGIOPLASTY     EYE SURGERY     HERNIA REPAIR Bilateral    x 2   different times   PROSTATE SURGERY     had cancer; radical resection 1997    Family History  Problem Relation Age of Onset   Colon cancer Father        possibly age 5 y.o diagnosed    Cancer Father    Stroke Mother    Asthma Sister    Social History   Socioeconomic History   Marital status: Married    Spouse name: Louana   Number of children: 3   Years of education: [employer] education level: Some college, no degree  Occupational History   Occupation: retired  Tobacco Use   Smoking status: Former    Current packs/day: 0.00    Types: Cigarettes    Start date: 09/09/1970    Quit date: 09/08/1985    Years since quitting: 38.1   Smokeless tobacco: Never  Vaping Use   Vaping status: Never Used  Substance and Sexual Activity   Alcohol use: Not Currently    Comment: seldom   Drug use: No   Sexual activity: Not Currently  Other Topics Concern   Not on file  Social History Narrative   3 kids    Retired Technical sales engineer    Former smoker quit 1980s. Denies etOH, other drugs    Patient lives at home with family. Patient lives at home with his wife Eugene Murray)   Caffeine Use: 1 glass of tea every other day   Social Drivers of Health   Financial Resource Strain: Low Risk  (11/02/2023)   Overall Financial Resource Strain (CARDIA)    Difficulty of  Paying Living Expenses: Not hard at all  Food Insecurity: No Food Insecurity (11/02/2023)   Hunger Vital Sign    Worried About Running Out of Food in the Last Year: Never true    Ran Out of Food in the Last Year: Never true  Transportation Needs: No Transportation Needs (11/02/2023)   PRAPARE - Administrator, Civil Service (Medical): No    Lack of Transportation (Non-Medical): No  Physical Activity: Inactive (11/02/2023)   Exercise Vital Sign    Days of Exercise per Week: 0 days  Minutes of Exercise per Session: 0 min  Stress: No Stress Concern Present (11/02/2023)   [employer] of Occupational Health - Occupational Stress Questionnaire    Feeling of Stress: Not at all  Social Connections: Moderately Isolated (11/02/2023)   Social Connection and Isolation Panel    Frequency of Communication with Friends and Family: More than three times a week    Frequency of Social Gatherings with Friends and Family: Never    Attends Religious Services: Never    Database administrator or Organizations: No    Attends Engineer, structural: Never    Marital Status: Married    Tobacco Counseling Counseling given: Not Answered    Clinical Intake:  Pre-visit preparation completed: Yes  Pain : No/denies pain     Nutritional Risks: None Diabetes: No  Lab Results  Component Value Date   HGBA1C 5.4 10/10/2023   HGBA1C 5.9 (H) 04/26/2023   HGBA1C 5.6 10/21/2022     How often do you need to have someone help you when you read instructions, pamphlets, or other written materials from your doctor or pharmacy?: 1 - Never  Interpreter Needed?: No  Information entered by :: NAllen LPN   Activities of Daily Living     11/02/2023   11:37 AM  In your present state of health, do you have any difficulty performing the following activities:  Hearing? 0  Vision? 1  Comment eye surgery coming up  Difficulty concentrating or making decisions? 0  Walking or climbing stairs? 0   Dressing or bathing? 0  Doing errands, shopping? 0  Preparing Food and eating ? N  Using the Toilet? N  In the past six months, have you accidently leaked urine? Y  Comment couple times, seen an urologist  Do you have problems with loss of bowel control? N  Managing your Medications? N  Managing your Finances? N  Housekeeping or managing your Housekeeping? N    Patient Care Team: Jarold Medici, MD as PCP - General (Internal Medicine) Burnard Debby LABOR, MD (Inactive) as PCP - Cardiology (Cardiology) Morgan Clayborne CROME, RN as [facility] Cyrus Carwin, MD as Consulting Physician (Ophthalmology)  I have updated your Care Teams any recent Medical Services you may have received from other providers in the past year.     Assessment:   This is a routine wellness examination for Jadrian.  Hearing/Vision screen Hearing Screening - Comments:: Denies hearing issues Vision Screening - Comments:: Regular eye exams, Dr. Cyrus   Goals Addressed             This Visit's Progress    Patient Stated       11/02/2023, keep moving       Depression Screen     11/02/2023   11:45 AM 10/10/2023    2:49 PM 06/16/2023    1:50 PM 04/26/2023   10:39 AM 10/21/2022   10:41 AM 07/08/2022    2:26 PM 04/09/2022    1:36 PM  PHQ 2/9 Scores  PHQ - 2 Score 0 0 0 0 0 0 0  PHQ- 9 Score 2 0  0  0     Fall Risk     11/02/2023   11:43 AM 10/10/2023    2:49 PM 06/16/2023    1:51 PM 04/26/2023   10:39 AM 10/21/2022   10:40 AM  Fall Risk   Falls in the past year? 0 0 0 0 0  Number falls in past yr: 0 0 0 0 0  Injury with Fall? 0 0 0 0 0  Risk for fall due to : Medication side effect No Fall Risks No Fall Risks No Fall Risks Medication side effect  Follow up Falls evaluation completed;Falls prevention discussed Falls evaluation completed  Falls evaluation completed Falls prevention discussed;Falls evaluation completed    MEDICARE RISK AT HOME:  Medicare Risk at Home Any stairs in or around the  home?: Yes If so, are there any without handrails?: No Home free of loose throw rugs in walkways, pet beds, electrical cords, etc?: Yes Adequate lighting in your home to reduce risk of falls?: Yes Life alert?: No Use of a cane, walker or w/c?: No Grab bars in the bathroom?: No Shower chair or bench in shower?: No Elevated toilet seat or a handicapped toilet?: Yes  TIMED UP AND GO:  Was the test performed?  No  Cognitive Function: 6CIT completed        11/02/2023   11:47 AM 10/21/2022   10:41 AM 10/14/2021    9:58 AM 09/11/2020   11:32 AM 08/30/2019   10:38 AM  6CIT Screen  What Year? 0 points 0 points 0 points 0 points 0 points  What month? 0 points 0 points 0 points 0 points 0 points  What time? 0 points 3 points 0 points 0 points 0 points  Count back from 20 0 points 0 points 0 points 0 points 0 points  Months in reverse 0 points 0 points 0 points 0 points 0 points  Repeat phrase 0 points 2 points 0 points 4 points 0 points  Total Score 0 points 5 points 0 points 4 points 0 points    Immunizations Immunization History  Administered Date(s) Administered   Fluad Quad(high Dose 65+) 03/05/2020   INFLUENZA, HIGH DOSE SEASONAL PF 02/27/2019   Influenza,inj,Quad PF,6+ Mos 12/05/2012, 12/06/2013, 02/10/2015, 02/10/2016   PFIZER(Purple Top)SARS-COV-2 Vaccination 04/06/2019, 04/27/2019, 12/26/2019   PNEUMOCOCCAL CONJUGATE-20 11/10/2021   Pneumococcal Polysaccharide-23 02/20/2019   Zoster Recombinant(Shingrix ) 10/14/2021, 01/19/2022   Zoster, Live 01/10/2013    Screening Tests Health Maintenance  Topic Date Due   INFLUENZA VACCINE  09/30/2023   COVID-19 Vaccine (4 - 2025-26 season) 10/31/2023   Medicare Annual Wellness (AWV)  11/01/2024   Pneumococcal Vaccine: 50+ Years  Completed   Zoster Vaccines- Shingrix   Completed   HPV VACCINES  Aged Out   Meningococcal B Vaccine  Aged Out   DTaP/Tdap/Td  Discontinued   Hepatitis C Screening  Discontinued    Health  Maintenance  Health Maintenance Due  Topic Date Due   INFLUENZA VACCINE  09/30/2023   COVID-19 Vaccine (4 - 2025-26 season) 10/31/2023   Health Maintenance Items Addressed: Due for flu and covid vaccine.  Additional Screening:  Vision Screening: Recommended annual ophthalmology exams for early detection of glaucoma and other disorders of the eye. Would you like a referral to an eye doctor? No    Dental Screening: Recommended annual dental exams for proper oral hygiene  Community Resource Referral / Chronic Care Management: CRR required this visit?  No   CCM required this visit?  No   Plan:    I have personally reviewed and noted the following in the patient's chart:   Medical and social history Use of alcohol, tobacco or illicit drugs  Current medications and supplements including opioid prescriptions. Patient is not currently taking opioid prescriptions. Functional ability and status Nutritional status Physical activity Advanced directives List of other physicians Hospitalizations, surgeries, and ER visits in previous 12 months Vitals Screenings  to include cognitive, depression, and falls Referrals and appointments  In addition, I have reviewed and discussed with patient certain preventive protocols, quality metrics, and best practice recommendations. A written personalized care plan for preventive services as well as general preventive health recommendations were provided to patient.   Ardella FORBES Dawn, LPN   0/[id number]   After Visit Summary: (MyChart) Due to this being a telephonic visit, the after visit summary with patients personalized plan was offered to patient via MyChart   Notes: Nothing significant to report at this time.

## 2023-11-07 ENCOUNTER — Other Ambulatory Visit: Payer: Self-pay | Admitting: General Practice

## 2023-11-07 DIAGNOSIS — Z79899 Other long term (current) drug therapy: Secondary | ICD-10-CM

## 2023-11-08 ENCOUNTER — Ambulatory Visit

## 2023-11-08 DIAGNOSIS — Z87891 Personal history of nicotine dependence: Secondary | ICD-10-CM | POA: Diagnosis not present

## 2023-11-08 DIAGNOSIS — I10 Essential (primary) hypertension: Secondary | ICD-10-CM | POA: Diagnosis not present

## 2023-11-08 DIAGNOSIS — H268 Other specified cataract: Secondary | ICD-10-CM | POA: Diagnosis not present

## 2023-11-08 DIAGNOSIS — H2511 Age-related nuclear cataract, right eye: Secondary | ICD-10-CM | POA: Diagnosis not present

## 2023-11-08 DIAGNOSIS — I2510 Atherosclerotic heart disease of native coronary artery without angina pectoris: Secondary | ICD-10-CM | POA: Diagnosis not present

## 2023-11-09 ENCOUNTER — Other Ambulatory Visit: Payer: Self-pay | Admitting: General Practice

## 2023-11-09 DIAGNOSIS — Z79899 Other long term (current) drug therapy: Secondary | ICD-10-CM

## 2023-11-14 ENCOUNTER — Ambulatory Visit
Attending: Student in an Organized Health Care Education/Training Program | Admitting: Student in an Organized Health Care Education/Training Program

## 2023-11-14 ENCOUNTER — Encounter: Payer: Self-pay | Admitting: Student in an Organized Health Care Education/Training Program

## 2023-11-14 VITALS — BP 120/86 | HR 61 | Ht 70.0 in | Wt 191.4 lb

## 2023-11-14 DIAGNOSIS — I10 Essential (primary) hypertension: Secondary | ICD-10-CM | POA: Diagnosis not present

## 2023-11-14 DIAGNOSIS — Z9861 Coronary angioplasty status: Secondary | ICD-10-CM | POA: Diagnosis not present

## 2023-11-14 DIAGNOSIS — I2510 Atherosclerotic heart disease of native coronary artery without angina pectoris: Secondary | ICD-10-CM | POA: Insufficient documentation

## 2023-11-14 DIAGNOSIS — E785 Hyperlipidemia, unspecified: Secondary | ICD-10-CM | POA: Diagnosis not present

## 2023-11-14 DIAGNOSIS — Z8673 Personal history of transient ischemic attack (TIA), and cerebral infarction without residual deficits: Secondary | ICD-10-CM | POA: Diagnosis not present

## 2023-11-14 MED ORDER — HYDROCHLOROTHIAZIDE 12.5 MG PO TABS
25.0000 mg | ORAL_TABLET | Freq: Every day | ORAL | Status: DC
Start: 2023-11-14 — End: 2024-01-02

## 2023-11-14 NOTE — Assessment & Plan Note (Signed)
 His BP today is mildly elevated with a diastolic of 86.  His systolic is fine at 120.  However, his home blood pressure readings have been consistently elevated.  He is asymptomatic.  In an effort to improve his BP control, I will increase his HCTZ to 25 mg daily.  If additional blood pressure control is needed, can consider switching metoprolol  to Coreg.  I also encouraged him to check his blood pressure twice daily for the next 2 weeks and record them.  He will follow-up with APP in 1 month in 6 months with me. -Increase HCTZ 25 mg daily -Continue lisinopril , amlodipine , metoprolol  and Imdur  -BMP in 1 week -Blood pressure log for 2 weeks -Follow-up APP in 1 month -Follow-up me in 6 months

## 2023-11-14 NOTE — Progress Notes (Signed)
 Cardiology Office Note:   Date:  11/14/2023  ID:  Eugene Murray, DOB [birth year], MRN [id number] PCP: Eugene Medici, MD  [facility] Providers Cardiologist:  Eugene Archer, MD { Chief Complaint:  Chief Complaint  Patient presents with   Hypertension      History of Present Illness:   Eugene Murray is a 83 y.o. male with a PMH of CAD s/p PCI (2008, 2011), HTN, HLD, prior CVA, and OSA who presents for follow up.  He was previously seen by Dr. Burnard.  Patient presents today for follow-up.  He has no complaints.  His wife previously had a CVA and he has been her primary caretaker for years.  He shares some caregiver stress but is in good spirits.  He has been working on diet cutting out late-night sweets.  He denies chest pain, SOB, PND, orthopnea, swelling, palpitations, syncope, and presyncope.  He has been checking his blood pressure periodically and has noted SBP's consistently >130.  No further concerns.   Past Medical History:  Diagnosis Date   Allergic rhinitis    Allergy    Cancer (HCC)    Prostate cancer   Cataract    Coronary artery disease    History of prostate cancer    Hyperlipidemia    Hypertension    Hypokalemia    Nephritis    as a child   OSA (obstructive sleep apnea)    cpap machine at home   Sleep apnea    Stroke [facility])    mini stroke TIA     Studies Reviewed:    EKG:  EKG Interpretation Date/Time:  Monday November 14 2023 13:18:44 EDT Ventricular Rate:  61 PR Interval:  184 QRS Duration:  94 QT Interval:  422 QTC Calculation: 424 R Axis:   -2  Text Interpretation: Sinus rhythm with occasional Premature ventricular complexes and fusion complexes Left ventricular hypertrophy with repolarization abnormality ( R in aVL ) When compared with ECG of 30-Jun-2012 13:02, Premature ventricular complexes are now Present Confirmed by Murray Eugene [phone]) on 11/14/2023 1:23:42 PM     Cardiac Studies & Procedures    ______________________________________________________________________________________________   STRESS TESTS  MYOCARDIAL PERFUSION IMAGING 04/10/2020  Interpretation Summary  The left ventricular ejection fraction is normal (55-65%).  Nuclear stress EF: 55%.  The study is normal.  This is a low risk study.  Normal pharmacologic nuclear stress test with no evidence for prior infarct or ischemia. Normal LVEF.            ______________________________________________________________________________________________      Risk Assessment/Calculations:              Physical Exam:     VS:  BP 120/86   Pulse 61   Ht 5' 10 (1.778 m)   Wt 191 lb 6.4 oz (86.8 kg)   SpO2 97%   BMI 27.46 kg/m      Wt Readings from Last 3 Encounters:  10/10/23 192 lb (87.1 kg)  07/29/23 190 lb (86.2 kg)  06/16/23 185 lb (83.9 kg)     GEN: Well nourished, well developed, in no acute distress NECK: No JVD; No carotid bruits CARDIAC: RRR, no murmurs, rubs, gallops RESPIRATORY:  Clear to auscultation without rales, wheezing or rhonchi  ABDOMEN: Soft, non-tender, non-distended, normal bowel sounds EXTREMITIES:  Warm and well perfused, no edema; No deformity, 2+ radial pulses PSYCH: Normal mood and affect   Assessment & Plan Essential hypertension His BP today is mildly elevated with a diastolic of  86.  His systolic is fine at 120.  However, his home blood pressure readings have been consistently elevated.  He is asymptomatic.  In an effort to improve his BP control, I will increase his HCTZ to 25 mg daily.  If additional blood pressure control is needed, can consider switching metoprolol  to Coreg.  I also encouraged him to check his blood pressure twice daily for the next 2 weeks and record them.  He will follow-up with APP in 1 month in 6 months with me. -Increase HCTZ 25 mg daily -Continue lisinopril , amlodipine , metoprolol  and Imdur  -BMP in 1 week -Blood pressure log for 2  weeks -Follow-up APP in 1 month -Follow-up me in 6 months  CAD S/P percutaneous coronary angioplasty Currently asymptomatic.  On Plavix  monotherapy indefinitely.  No changes.  Dyslipidemia His LDL is at goal as of August.  No changes.          This note was written with the assistance of a dictation microphone or AI dictation software. Please excuse any typos or grammatical errors.   Signed, Eugene Archer, MD 11/14/2023 12:58 PM    [facility]

## 2023-11-14 NOTE — Assessment & Plan Note (Signed)
 His LDL is at goal as of August.  No changes.

## 2023-11-14 NOTE — Patient Instructions (Signed)
 Medication Instructions:  INCREASE Hydrochlorothiazide  to 25 mg daily *If you need a refill on your cardiac medications before your next appointment, please call your pharmacy*  Lab Work: BMP in one week If you have labs (blood work) drawn today and your tests are completely normal, you will receive your results only by: MyChart Message (if you have MyChart) OR A paper copy in the mail If you have any lab test that is abnormal or we need to change your treatment, we will call you to review the results.  Testing/Procedures: NONE  Follow-Up: At [facility], you and your health needs are our priority.  As part of our continuing mission to provide you with exceptional heart care, our providers are all part of one team.  This team includes your primary Cardiologist (physician) and Advanced Practice Providers or APPs (Physician Assistants and Nurse Practitioners) who all work together to provide you with the care you need, when you need it.  Your next appointment:   1 month  Provider:   One of our Advanced Practice Providers (APPs): Morse Clause, PA-C  Lamarr Satterfield, NP Miriam Shams, NP  Olivia Pavy, PA-C Josefa Beauvais, NP  Leontine Salen, PA-C Orren Fabry, PA-C  [place], PA-C Ernest Dick, NP  Damien Braver, NP Jon Hails, PA-C  Waddell Donath, PA-C    Dayna Dunn, PA-C  Scott Weaver, PA-C Lum Louis, NP Katlyn West, NP Callie Goodrich, PA-C  Xika Zhao, NP Sheng Haley, PA-C    Kathleen Johnson, PA-C   Then, Georganna Archer, MD will plan to see you again in 6 month(s).    We recommend signing up for the patient portal called MyChart.  Sign up information is provided on this After Visit Summary.  MyChart is used to connect with patients for Virtual Visits (Telemedicine).  Patients are able to view lab/test results, encounter notes, upcoming appointments, etc.  Non-urgent messages can be sent to your provider as well.   To learn more about what you can do with  MyChart, go to [email or web address].

## 2023-11-15 DIAGNOSIS — H2511 Age-related nuclear cataract, right eye: Secondary | ICD-10-CM | POA: Diagnosis not present

## 2023-11-21 DIAGNOSIS — I10 Essential (primary) hypertension: Secondary | ICD-10-CM | POA: Diagnosis not present

## 2023-11-22 ENCOUNTER — Other Ambulatory Visit: Payer: Self-pay | Admitting: General Practice

## 2023-11-22 ENCOUNTER — Ambulatory Visit (INDEPENDENT_AMBULATORY_CARE_PROVIDER_SITE_OTHER)

## 2023-11-22 ENCOUNTER — Ambulatory Visit: Payer: Self-pay | Admitting: Student in an Organized Health Care Education/Training Program

## 2023-11-22 VITALS — BP 122/84 | HR 53 | Temp 98.1°F | Ht 70.0 in | Wt 191.0 lb

## 2023-11-22 DIAGNOSIS — Z23 Encounter for immunization: Secondary | ICD-10-CM | POA: Diagnosis not present

## 2023-11-22 LAB — BASIC METABOLIC PANEL WITH GFR
BUN/Creatinine Ratio: 13 (ref 10–24)
BUN: 14 mg/dL (ref 8–27)
CO2: 25 mmol/L (ref 20–29)
Calcium: 8.7 mg/dL (ref 8.6–10.2)
Chloride: 102 mmol/L (ref 96–106)
Creatinine, Ser: 1.07 mg/dL (ref 0.76–1.27)
Glucose: 83 mg/dL (ref 70–99)
Potassium: 3.6 mmol/L (ref 3.5–5.2)
Sodium: 144 mmol/L (ref 134–144)
eGFR: 69 mL/min/1.73 (ref >59)

## 2023-11-22 NOTE — Progress Notes (Signed)
 Patient is in office today for a nurse visit for FLU Immunization. Patient Injection was given in the  Right deltoid. Patient tolerated injection well.

## 2023-11-22 NOTE — Patient Instructions (Signed)

## 2023-11-23 DIAGNOSIS — N430 Encysted hydrocele: Secondary | ICD-10-CM | POA: Diagnosis not present

## 2023-11-23 DIAGNOSIS — N5231 Erectile dysfunction following radical prostatectomy: Secondary | ICD-10-CM | POA: Diagnosis not present

## 2023-11-23 DIAGNOSIS — R351 Nocturia: Secondary | ICD-10-CM | POA: Diagnosis not present

## 2023-11-23 DIAGNOSIS — N393 Stress incontinence (female) (male): Secondary | ICD-10-CM | POA: Diagnosis not present

## 2023-12-10 ENCOUNTER — Other Ambulatory Visit: Payer: Self-pay | Admitting: General Practice

## 2023-12-10 DIAGNOSIS — Z79899 Other long term (current) drug therapy: Secondary | ICD-10-CM

## 2023-12-15 ENCOUNTER — Other Ambulatory Visit: Payer: Self-pay | Admitting: General Practice

## 2023-12-15 NOTE — Progress Notes (Unsigned)
 Cardiology Clinic Note   Patient Name: Eugene Murray Date of Encounter: 12/19/2023  Primary Care Provider:  Jarold Medici, MD Primary Cardiologist:  Georganna Archer, MD  Patient Profile    Eugene Murray 83 year old male presents the clinic today for follow-up evaluation of his blood pressure.  Past Medical History    Past Medical History:  Diagnosis Date   Allergic rhinitis    Allergy    Cancer (HCC)    Prostate cancer   Cataract    Coronary artery disease    History of prostate cancer    Hyperlipidemia    Hypertension    Hypokalemia    Nephritis    as a child   OSA (obstructive sleep apnea)    cpap machine at home   Sleep apnea    Stroke [facility]    mini stroke TIA   Past Surgical History:  Procedure Laterality Date   CARDIAC CATHETERIZATION     CAROTID STENT  11-08 and 4-11   stent x 3 (12/2006 x 2; 2011 x 1 stent)   CATARACT EXTRACTION W/PHACO Left 08/21/2014   Procedure: PHACO EMULSION CATARACT EXTRACTION AND INTRAOCULAR LENS PLACEMENT (IOC) IMPLANT LEFT EYE;  Surgeon: Gaither Quan, MD;  Location: [facility] OR;  Service: Ophthalmology;  Laterality: Left;   COLONOSCOPY  03/01/2013   COLONOSCOPY W/ POLYPECTOMY     CORONARY ANGIOPLASTY     EYE SURGERY     HERNIA REPAIR Bilateral    x 2   different times   PROSTATE SURGERY     had cancer; radical resection 1997     Allergies  Allergies  Allergen Reactions   Other Hives    SEAFOOD   Penicillins Other (See Comments)    caused me to pass out   Sulfa Antibiotics Hives    History of Present Illness    AD GUTTMAN has a PMH of HTN, CAD status post PCI in 2008 and 2011, hyperlipidemia, prior CVA, and OSA.  He was seen and evaluated by Dr. Archer on 11/14/2023.  During that time he was noted to have elevated blood pressure.  His HCTZ was increased from 12-1/2 mg to 25 mg daily.  He was noted to have a blood pressure of 120/86.  He noted consistently elevated blood pressures at home.  During his prior  visit he was asymptomatic.  He did have some caregiver stress.  He was working to cut back on evening/late night snacking/sweets.  He denies chest pain, PND, orthopnea, palpitations, presyncope and syncope.  Follow-up BMP was within normal limits.  He presents to the clinic today for follow-up evaluation and states he continues to monitor his blood pressure at home.  They have been fairly well-controlled.  His blood pressure in clinic today is 128/64.  He is tolerating his increased HCTZ well.  We reviewed his prior cardiac history.  He expressed understanding.  He continues to have some increased stress related to caring for his wife.  He notes that he stays very active in this role.  We reviewed secondary causes of hypertension.  He expressed understanding.  We also reviewed his most recent lab work.  I will continue his current medication regimen, and plan follow-up in about 6 months..  He denies chest pain, shortness of breath, lower extremity edema, fatigue, palpitations, melena, hematuria, hemoptysis, diaphoresis, weakness, presyncope, syncope, orthopnea, and PND.    Home Medications    Prior to Admission medications   Medication Sig Start Date End Date Taking? Authorizing  Provider  amLODipine  (NORVASC ) 10 MG tablet TAKE 1 TABLET(10 MG) BY MOUTH DAILY 01/31/23   Burnard Debby LABOR, MD  atorvastatin  (LIPITOR ) 40 MG tablet TAKE 1 TABLET(40 MG) BY MOUTH DAILY 11/22/23   Emelia Josefa HERO, NP  BESIVANCE 0.6 % SUSP Place 1 drop into the right eye 3 (three) times daily. 10/12/23   [provider]  clopidogrel  (PLAVIX ) 75 MG tablet TAKE 1 TABLET(75 MG) BY MOUTH DAILY 10/27/23   Jarold Medici, MD  EPINEPHrine  (EPIPEN  2-PAK) 0.3 mg/0.3 mL IJ SOAJ injection Inject 0.3 mg into the muscle as needed for anaphylaxis. 10/14/21   Jarold Medici, MD  hydrochlorothiazide  (HYDRODIURIL ) 12.5 MG tablet Take 2 tablets (25 mg total) by mouth daily. 11/14/23   Floretta Mallard, MD  isosorbide  mononitrate (IMDUR ) 30  MG 24 hr tablet TAKE 1 TABLET(30 MG) BY MOUTH DAILY 12/12/23   Emelia Josefa HERO, NP  lisinopril  (ZESTRIL ) 40 MG tablet Take 1 tablet (40 mg total) by mouth daily. 01/11/23   Jarold Medici, MD  metoprolol  tartrate (LOPRESSOR ) 50 MG tablet Take 1 tablet (50 mg total) by mouth 2 (two) times daily. 10/10/23   Emelia Josefa HERO, NP  Multiple Vitamin (MULTIVITAMIN) tablet Take 1 tablet by mouth daily.    [provider]  nitroGLYCERIN  (NITROSTAT ) 0.4 MG SL tablet Place 1 tablet (0.4 mg total) under the tongue every 5 (five) minutes as needed for chest pain. 11/30/22   Burnard Debby LABOR, MD    Family History    Family History  Problem Relation Age of Onset   Colon cancer Father        possibly age 25 y.o diagnosed    Cancer Father    Stroke Mother    Asthma Sister    He indicated that his mother is deceased. He indicated that his father is deceased. He indicated that the status of his sister is unknown.  Social History    Social History   Socioeconomic History   Marital status: Married    Spouse name: Eugene Murray   Number of children: 3   Years of education: [employer] education level: Some college, no degree  Occupational History   Occupation: retired  Tobacco Use   Smoking status: Former    Current packs/day: 0.00    Types: Cigarettes    Start date: 09/09/1970    Quit date: 09/08/1985    Years since quitting: 38.3   Smokeless tobacco: Never  Vaping Use   Vaping status: Never Used  Substance and Sexual Activity   Alcohol use: Not Currently    Comment: seldom   Drug use: No   Sexual activity: Not Currently  Other Topics Concern   Not on file  Social History Narrative   3 kids    Retired Technical sales engineer    Former smoker quit 1980s. Denies etOH, other drugs    Patient lives at home with family. Patient lives at home with his wife [name])   Caffeine Use: 1 glass of tea every other day   Social Drivers of Health   Financial Resource Strain: Low Risk  (11/02/2023)   Overall  Financial Resource Strain (CARDIA)    Difficulty of Paying Living Expenses: Not hard at all  Food Insecurity: No Food Insecurity (11/02/2023)   Hunger Vital Sign    Worried About Running Out of Food in the Last Year: Never true    Ran Out of Food in the Last Year: Never true  Transportation Needs: No Transportation Needs (11/02/2023)   PRAPARE -  Administrator, Civil Service (Medical): No    Lack of Transportation (Non-Medical): No  Physical Activity: Inactive (11/02/2023)   Exercise Vital Sign    Days of Exercise per Week: 0 days    Minutes of Exercise per Session: 0 min  Stress: No Stress Concern Present (11/02/2023)   [employer] of Occupational Health - Occupational Stress Questionnaire    Feeling of Stress: Not at all  Social Connections: Moderately Isolated (11/02/2023)   Social Connection and Isolation Panel    Frequency of Communication with Friends and Family: More than three times a week    Frequency of Social Gatherings with Friends and Family: Never    Attends Religious Services: Never    Database administrator or Organizations: No    Attends Banker Meetings: Never    Marital Status: Married  Catering manager Violence: Not At Risk (11/02/2023)   Humiliation, Afraid, Rape, and Kick questionnaire    Fear of Current or Ex-Partner: No    Emotionally Abused: No    Physically Abused: No    Sexually Abused: No     Review of Systems    General:  No chills, fever, night sweats or weight changes.  Cardiovascular:  No chest pain, dyspnea on exertion, edema, orthopnea, palpitations, paroxysmal nocturnal dyspnea. Dermatological: No rash, lesions/masses Respiratory: No cough, dyspnea Urologic: No hematuria, dysuria Abdominal:   No nausea, vomiting, diarrhea, bright red blood per rectum, melena, or hematemesis Neurologic:  No visual changes, wkns, changes in mental status. All other systems reviewed and are otherwise negative except as noted  above.  Physical Exam    VS:  BP 128/64   Pulse (!) 54   Ht 5' 10 (1.778 m)   Wt 192 lb (87.1 kg)   SpO2 98%   BMI 27.55 kg/m  , BMI Body mass index is 27.55 kg/m. GEN: Well nourished, well developed, in no acute distress. HEENT: normal. Neck: Supple, no JVD, carotid bruits, or masses. Cardiac: RRR, no murmurs, rubs, or gallops. No clubbing, cyanosis, edema.  Radials/DP/PT 2+ and equal bilaterally.  Respiratory:  Respirations regular and unlabored, clear to auscultation bilaterally. GI: Soft, nontender, nondistended, BS + x 4. MS: no deformity or atrophy. Skin: warm and dry, no rash. Neuro:  Strength and sensation are intact. Psych: Normal affect.  Accessory Clinical Findings    Recent Labs: 10/10/2023: ALT 15; Hemoglobin 13.5; Platelets 124; TSH 0.617 11/21/2023: BUN 14; Creatinine, Ser 1.07; Potassium 3.6; Sodium 144   Recent Lipid Panel    Component Value Date/Time   CHOL 112 10/10/2023 1528   TRIG 48 10/10/2023 1528   HDL 42 10/10/2023 1528   CHOLHDL 2.7 10/10/2023 1528   CHOLHDL 3.1 08/05/2015 1050   VLDL 14 08/05/2015 1050   LDLCALC 58 10/10/2023 1528         ECG personally reviewed by me today-none today.    Echocardiogram 07/01/2012 Study Conclusions   - Procedure narrative: Transthoracic echocardiography.    Technically difficult study with suboptimal visualization    of the valves and endocardium.  - Left ventricle: The cavity size was normal. There was    moderate concentric hypertrophy. Systolic function was    normal. The estimated ejection fraction was in the range    of 55% to 60%. Wall motion was normal; there were no    regional wall motion abnormalities. Left ventricular    diastolic function parameters were normal.  - Left atrium: The atrium was normal in size.  Transthoracic  echocardiography.  M-mode, complete 2D,  spectral Doppler, and color Doppler.  Height:  Height:  180.3cm. Height: 71in.  Weight:  Weight: 90.7kg. Weight:  199.5lb.   Body mass index:  BMI: 27.9kg/m^2.  Body surface  area:    BSA: 2.33m^2.  Blood pressure:     137/68.  Patient  status:  Inpatient.   ------------------------------------------------------------   ------------------------------------------------------------  Left ventricle:  The cavity size was normal. There was  moderate concentric hypertrophy. Systolic function was  normal. The estimated ejection fraction was in the range of  55% to 60%. Wall motion was normal; there were no regional  wall motion abnormalities. The transmitral flow pattern was  normal. The deceleration time of the early transmitral flow  velocity was normal. The pulmonary vein flow pattern was  normal. The tissue Doppler parameters were normal. Left  ventricular diastolic function parameters were normal.   ------------------------------------------------------------  Aortic valve:  Poorly visualized.  Doppler:   There was no  stenosis.    No significant regurgitation.   ------------------------------------------------------------  Aorta: Aortic root: The aortic root was normal in size.  Ascending aorta: The ascending aorta was normal in size.   ------------------------------------------------------------  Mitral valve:  Poorly visualized.  Doppler:   No significant  regurgitation.   ------------------------------------------------------------  Left atrium:  The atrium was normal in size.   ------------------------------------------------------------  Atrial septum:  Poorly visualized.   ------------------------------------------------------------  Right ventricle:  The cavity size was normal. Wall thickness  was normal. Systolic function was normal.   ------------------------------------------------------------  Pulmonic valve:   Poorly visualized.   ------------------------------------------------------------  Pulmonary artery:   Poorly visualized.    ------------------------------------------------------------  Right atrium:  The atrium was normal in size.   ------------------------------------------------------------  Pericardium: There was no pericardial effusion.   LHC 06/03/2009  Showed normal LVEF, widely patent diagonal stent from 11/08 with 20% hold narrowing in mid LAD, normal circumflex, widely patent stent in distal RCA.  He received successful PCI with distal RCA stent.   EKG 9/25  Normal sinus rhythm with occasional PVCs and LVH 61 bpm   Assessment & Plan   1.  Essential hypertension-BP today 128/64. Maintain blood pressure log Heart healthy low-sodium diet Continue amlodipine , HCTZ, Imdur , lisinopril , metoprolol   Hyperlipidemia-LDL 58 on 10/10/2023. High-fiber diet Continue atorvastatin , Plavix   OSA-reports compliance with CPAP.  Waking up well rested. Continue CPAP use Sleep hygiene instructions  History of CVA-neurologically intact. Continue Plavix , atorvastatin  Follows with PCP  Disposition: Follow-up with Dr. Floretta or me 6 months.   Josefa HERO. Ingeborg Fite NP-C     12/19/2023, 3:33 PM [facility] [address] [place], [place] [address] Office [phone]    Notice: This dictation was prepared with Dragon dictation along with smaller phrase technology. Any transcriptional errors that result from this process are unintentional and may not be corrected upon review.   I spent 14 minutes examining this patient, reviewing medications, and using patient centered shared decision making involving their cardiac care.   I spent  20 minutes reviewing past medical history,  medications, and prior cardiac tests.

## 2023-12-19 ENCOUNTER — Ambulatory Visit: Attending: General Practice | Admitting: General Practice

## 2023-12-19 ENCOUNTER — Encounter: Payer: Self-pay | Admitting: General Practice

## 2023-12-19 VITALS — BP 128/64 | HR 54 | Ht 70.0 in | Wt 192.0 lb

## 2023-12-19 DIAGNOSIS — I10 Essential (primary) hypertension: Secondary | ICD-10-CM | POA: Diagnosis not present

## 2023-12-19 DIAGNOSIS — Z8673 Personal history of transient ischemic attack (TIA), and cerebral infarction without residual deficits: Secondary | ICD-10-CM | POA: Insufficient documentation

## 2023-12-19 DIAGNOSIS — G4733 Obstructive sleep apnea (adult) (pediatric): Secondary | ICD-10-CM | POA: Insufficient documentation

## 2023-12-19 DIAGNOSIS — E785 Hyperlipidemia, unspecified: Secondary | ICD-10-CM | POA: Insufficient documentation

## 2023-12-19 NOTE — Patient Instructions (Signed)
 Medication Instructions:  Your physician recommends that you continue on your current medications as directed. Please refer to the Current Medication list given to you today.  *If you need a refill on your cardiac medications before your next appointment, please call your pharmacy*   Follow-Up: At [facility], you and your health needs are our priority.  As part of our continuing mission to provide you with exceptional heart care, our providers are all part of one team.  This team includes your primary Cardiologist (physician) and Advanced Practice Providers or APPs (Physician Assistants and Nurse Practitioners) who all work together to provide you with the care you need, when you need it.  Your next appointment:   6 month(s)  Provider:   Georganna Archer, MD or Josefa Beauvais, NP

## 2023-12-28 ENCOUNTER — Other Ambulatory Visit: Payer: Self-pay | Admitting: Internal Medicine

## 2023-12-28 ENCOUNTER — Other Ambulatory Visit: Payer: Self-pay | Admitting: General Practice

## 2023-12-29 MED ORDER — METOPROLOL TARTRATE 50 MG PO TABS: 50.0000 mg | ORAL_TABLET | Freq: Two times a day (BID) | ORAL | 3 refills | Status: AC

## 2024-01-02 ENCOUNTER — Telehealth: Payer: Self-pay | Admitting: Student in an Organized Health Care Education/Training Program

## 2024-01-02 MED ORDER — HYDROCHLOROTHIAZIDE 25 MG PO TABS: 25.0000 mg | ORAL_TABLET | Freq: Every day | ORAL | 3 refills | Status: AC

## 2024-01-02 NOTE — Telephone Encounter (Signed)
 Prescription has been sent to [facility] for the hydrochlorothiazide  25 mg daily.

## 2024-01-02 NOTE — Telephone Encounter (Signed)
*  STAT* If patient is at the pharmacy, call can be transferred to refill team.   1. Which medications need to be refilled? (please list name of each medication and dose if known)   hydrochlorothiazide  (HYDRODIURIL ) 12.5 MG tablet   2. Would you like to learn more about the convenience, safety, & potential cost savings by using the [facility]?   3. Are you open to using the [facility] (Type [facility]. ).  4. Which pharmacy/location (including street and city if local pharmacy) is medication to be sent to?  [id number] - [place], [place] - [address] AT [address]   5. Do they need a 30 day or 90 day supply?   90 day  Patient stated he has been completely out of this medication for the last 3 days.  Patient noted Dr. Floretta has recommended patient increase his dosage of this medication and wants to get the 25 mg dosage.

## 2024-01-19 ENCOUNTER — Other Ambulatory Visit: Payer: Self-pay | Admitting: Internal Medicine

## 2024-04-02 ENCOUNTER — Other Ambulatory Visit: Payer: Self-pay | Admitting: Internal Medicine

## 2024-04-11 ENCOUNTER — Ambulatory Visit: Payer: Self-pay | Admitting: Internal Medicine

## 2024-04-16 ENCOUNTER — Ambulatory Visit: Admitting: Internal Medicine

## 2024-12-12 ENCOUNTER — Ambulatory Visit: Payer: Self-pay
